# Patient Record
Sex: Male | Born: 1950
Health system: Southern US, Community
[De-identification: ages and names within clinical notes are randomized; demographics above are authoritative.]

## PROBLEM LIST (undated history)

## (undated) DIAGNOSIS — L570 Actinic keratosis: Secondary | ICD-10-CM

## (undated) DIAGNOSIS — C801 Malignant (primary) neoplasm, unspecified: Secondary | ICD-10-CM

## (undated) DIAGNOSIS — C61 Malignant neoplasm of prostate: Secondary | ICD-10-CM

## (undated) DIAGNOSIS — H4010X0 Unspecified open-angle glaucoma, stage unspecified: Secondary | ICD-10-CM

## (undated) DIAGNOSIS — I10 Essential (primary) hypertension: Secondary | ICD-10-CM

## (undated) DIAGNOSIS — K219 Gastro-esophageal reflux disease without esophagitis: Secondary | ICD-10-CM

## (undated) HISTORY — DX: Malignant neoplasm of prostate: C61

## (undated) HISTORY — PX: ANTERIOR CRUCIATE LIGAMENT REPAIR: SHX115

## (undated) HISTORY — DX: Actinic keratosis: L57.0

---

## 2006-09-09 ENCOUNTER — Ambulatory Visit: Payer: Self-pay | Admitting: Unknown Physician Specialty

## 2009-02-27 DIAGNOSIS — C4492 Squamous cell carcinoma of skin, unspecified: Secondary | ICD-10-CM | POA: Insufficient documentation

## 2009-02-27 HISTORY — DX: Squamous cell carcinoma of skin, unspecified: C44.92

## 2012-01-31 ENCOUNTER — Ambulatory Visit: Payer: Self-pay | Admitting: Urology

## 2012-03-08 ENCOUNTER — Other Ambulatory Visit: Payer: Self-pay | Admitting: Urology

## 2012-04-03 ENCOUNTER — Ambulatory Visit: Payer: Self-pay | Admitting: Sports Medicine

## 2012-06-09 ENCOUNTER — Other Ambulatory Visit: Payer: Self-pay | Admitting: Urology

## 2012-06-09 NOTE — Progress Notes (Signed)
Need surgery orders thanks

## 2012-06-12 ENCOUNTER — Other Ambulatory Visit (HOSPITAL_COMMUNITY): Payer: Self-pay | Admitting: Urology

## 2012-06-12 NOTE — Patient Instructions (Addendum)
18 Dennis Steele  06/12/2012   Your procedure is scheduled on:  08-20-2011  Report to [facility] at  0830 AM.  Call this number if you have problems the morning of surgery: [phone]   Remember:   Do not eat food :After Midnight  Sunday night  Clear liquids all day Sunday 06-18-2012. Follow all bowel prep instructions by dr Laverle Patter.   Take these medicines the morning of surgery with A SIP OF WATER:  No meds to take   Do not wear jewelry or make up.  Do not wear lotions, powders, or perfumes. You may wear deodorant.    Do not bring valuables to the hospital.  Contacts, dentures or bridgework may not be worn into surgery.  Leave suitcase in the car. After surgery it may be brought to your room.  For patients admitted to the hospital, checkout time is 11:00 AM the day of discharge                             Patients discharged the day of surgery will not be allowed to drive home. If going home same day of surgery, you must have someone stay with you the first 24 hours at home and arrange for some one to drive you home from hospital.    Special Instructions: See [facility] Preparing for Surgery instruction sheet. Women do not shave legs or underarms for 12 hours before showers. Men may shave face morning of surgery.    Please read over the following fact sheets that you were given: MRSA Information, blood fact sheet, incentive spirometer fact sheet  [name] pre op nurse phone number [phone], call if needed

## 2012-06-13 ENCOUNTER — Encounter (HOSPITAL_COMMUNITY): Payer: Self-pay

## 2012-06-13 ENCOUNTER — Other Ambulatory Visit: Payer: Self-pay

## 2012-06-13 ENCOUNTER — Encounter (HOSPITAL_COMMUNITY): Payer: Self-pay | Admitting: Pharmacy Technician

## 2012-06-13 ENCOUNTER — Encounter (HOSPITAL_COMMUNITY)
Admission: RE | Admit: 2012-06-13 | Discharge: 2012-06-13 | Disposition: A | Discharge status: Home / Self Care | Payer: BC Managed Care – PPO | Source: Ambulatory Visit | Attending: Urology | Admitting: Urology

## 2012-06-13 ENCOUNTER — Ambulatory Visit (HOSPITAL_COMMUNITY)
Admission: RE | Admit: 2012-06-13 | Discharge: 2012-06-13 | Disposition: A | Discharge status: Home / Self Care | Payer: BC Managed Care – PPO | Source: Ambulatory Visit | Attending: Urology | Admitting: Urology

## 2012-06-13 DIAGNOSIS — C61 Malignant neoplasm of prostate: Secondary | ICD-10-CM | POA: Insufficient documentation

## 2012-06-13 DIAGNOSIS — Z01812 Encounter for preprocedural laboratory examination: Secondary | ICD-10-CM | POA: Insufficient documentation

## 2012-06-13 HISTORY — DX: Malignant (primary) neoplasm, unspecified: C80.1

## 2012-06-13 HISTORY — DX: Essential (primary) hypertension: I10

## 2012-06-13 HISTORY — DX: Gastro-esophageal reflux disease without esophagitis: K21.9

## 2012-06-13 LAB — BASIC METABOLIC PANEL
GFR calc Af Amer: >90 mL/min (ref >90)
GFR calc non Af Amer: 89 mL/min — ABNORMAL LOW (ref >90)
Potassium: 4.3 mEq/L (ref 3.5–5.1)
Sodium: 139 mEq/L (ref 135–145)

## 2012-06-13 LAB — SURGICAL PCR SCREEN
MRSA, PCR: NEGATIVE
Staphylococcus aureus: NEGATIVE

## 2012-06-13 LAB — CBC
Hemoglobin: 14.1 g/dL (ref 13.0–17.0)
RBC: 4.4 MIL/uL (ref 4.22–5.81)
WBC: 5 10*3/uL (ref 4.0–10.5)

## 2012-06-16 NOTE — H&P (Signed)
History of Present Illness  [name] is a 61 year old gentleman who was foung to have a PSA of 4.0 and free % of 19% prompting a prostate biopsy by Dr. Achilles Dunk on 12/10/11.  This demonstrated Gleason 3+4=7 adenocarcinoma of the prostate with 4 out of 12 biopsy cores positive for malignancy.  He underwent a staging MRI on 01/31/12 which revealed foci consistent with bilateral organ confined disease but no evidence of EPE, SVI, or lymphadenopathy. He has a maternal family history of breast cancer but no family history of prostate cancer.  TNM stage: cT1c N0 Mx PSA: 4.0 Gleason score: 3+4=7 Biopsy (12/10/11 - read by Dr. Denton Brick with [employer], Acc# [id number]): 4/12 cores -- R apex (10%, 3+3=6), R lateral apex (43%, 3+3=6), 25%, 3+3=6), R lateral base (50%, 3+4=7) Prostate volume: 31.6  cc  Nomogram: OC disease: 82% EPE: 13% SVI: 1% LNI: 2.2% PFS (surgery): 95%, 92%  Urinary function: IPSS is 8.  Erectile function: He denies erectile dysfunction. SHIM score is 25.   Interval history:  He has elected to proceed with surgical treatment of his prostate cancer and follows up today for preoperative evaluation. Overall, he has remained in stable health since I last saw him in July. He does continue to struggle with some left-sided hip pain and did undergo an MRI of the pelvis since his last visit which fortunately did not demonstrate any evidence for metastatic disease.     Past Medical History Problems  1. History of  Esophageal Reflux 530.81 2. History of  Nephrolithiasis V13.01 3. History of  Prostate Cancer V10.46 4. History of  Rosacea 695.3  Surgical History Problems  1. History of  Primary Repair Of Knee Ligament Cruciate Anterior  Current Meds 1. Lisinopril 20 MG Oral Tablet; Therapy: 12Jul2013 to 2. Meloxicam 7.5 MG Oral Tablet; Therapy: (Recorded:30Jul2013) to 3. Travatan Z 0.004 % Ophthalmic Solution; Therapy: (Recorded:30Jul2013) to  Allergies Medication  1.  No Known Drug Allergies  Family History Problems  1. Family history of  Bladder Cancer V16.52 2. Maternal history of  Breast Cancer V16.3 3. Maternal history of  Diabetes Mellitus V18.0 4. Family history of  Esophageal Cancer V16.0 5. Family history of  Hypertension V17.49  Social History Problems  1. Alcohol Use drinks alcohol socially, not an every day drinker 2. Cigars (___ A Day) 305.1 occassionally 3. Former Smoker V15.82 off and on for 2 years  Review of Systems Constitutional, skin, eye, otolaryngeal, hematologic/lymphatic, cardiovascular, pulmonary, endocrine, musculoskeletal, gastrointestinal, neurological and psychiatric system(s) were reviewed and pertinent findings if present are noted.  Musculoskeletal: joint pain.    Vitals Vital Signs [Data Includes: Last 1 Day]  19Nov2013 12:24PM  Blood Pressure: 143 / 77 Heart Rate: 78  Physical Exam Constitutional: Well nourished and well developed . No acute distress.  ENT:. The ears and nose are normal in appearance.  Neck: The appearance of the neck is normal and no neck mass is present.  Pulmonary: No respiratory distress, normal respiratory rhythm and effort and clear bilateral breath sounds.  Cardiovascular: Heart rate and rhythm are normal . No peripheral edema.  Abdomen: The abdomen is soft and nontender. No masses are palpated. No CVA tenderness. No hernias are palpable. No hepatosplenomegaly noted.  Rectal: Rectal exam demonstrates normal sphincter tone, no tenderness and no masses. Prostate size is estimated to be 30 g. The prostate has no nodularity and is not tender. The left seminal vesicle is nonpalpable. The right seminal vesicle is nonpalpable. The perineum  is normal on inspection.  Lymphatics: The femoral and inguinal nodes are not enlarged or tender.  Skin: Normal skin turgor, no visible rash and no visible skin lesions.  Neuro/Psych:. Mood and affect are appropriate.    Results/Data Urine [Data Includes:  Last 1 Day]   19Nov2013  COLOR YELLOW   APPEARANCE CLEAR   SPECIFIC GRAVITY 1.020   pH 6.0   GLUCOSE NEG mg/dL  BILIRUBIN MOD   KETONE NEG mg/dL  BLOOD TRACE   PROTEIN NEG mg/dL  UROBILINOGEN 0.2 mg/dL  NITRITE NEG   LEUKOCYTE ESTERASE NEG   SQUAMOUS EPITHELIAL/HPF RARE   WBC NONE SEEN WBC/hpf  RBC 0-2 RBC/hpf  BACTERIA NONE SEEN   CRYSTALS NONE SEEN   CASTS NONE SEEN    Assessment  Prostate Cancer 185    Plan  Health Maintenance (V70.0)  1. UA With REFLEX  Done: 19Nov2013 10:35AM Prostate Cancer (185)  2. Follow-up Office  Follow-up  Requested for: 19Nov2013  Discussion/Summary 1. Prostate cancer: He is ready to proceed with surgical treatment of his prostate cancer and is scheduled to undergo a bilateral nerve sparing robotic prostatectomy and pelvic lymphadenectomy next week. We have reviewed the potential risks, complications, alternative options, and expected recovery process. We have also discussed the potential risks and benefits with regard to cancer control, urinary function, and erectile function. He feels well prepared to proceed and gives his informed consent today.  He will stop his anti-inflammatory medication at least 48 hours prior to his procedure. He also mentioned that he does tend to get yeast infections when he takes antibiotics and had questions about taking an antifungal around the time of his postoperative antibiotic. We discussed having him take fluconazole when he returns for his postoperative visit.  Cc: Dr. Lorie Phenix Dr. Assunta Gambles     Signatures Electronically signed by : Heloise Purpura, M.D.; Jun 13 2012  1:57PM

## 2012-06-18 MED ORDER — CEFAZOLIN SODIUM-DEXTROSE 2-3 GM-% IV SOLR
2.0000 g | INTRAVENOUS | Status: AC
  Administered 2012-06-19: 2 g via INTRAVENOUS

## 2012-06-19 ENCOUNTER — Encounter (HOSPITAL_COMMUNITY)
Admission: RE | Disposition: A | Discharge status: Home / Self Care | Payer: Self-pay | Source: Ambulatory Visit | Attending: Urology

## 2012-06-19 ENCOUNTER — Observation Stay (HOSPITAL_COMMUNITY)
Admission: RE | Admit: 2012-06-19 | Discharge: 2012-06-20 | Disposition: A | Discharge status: Home / Self Care | Payer: BC Managed Care – PPO | Source: Ambulatory Visit | Attending: Urology | Admitting: Urology

## 2012-06-19 ENCOUNTER — Inpatient Hospital Stay (HOSPITAL_COMMUNITY): Payer: BC Managed Care – PPO | Admitting: Anesthesiology

## 2012-06-19 ENCOUNTER — Encounter (HOSPITAL_COMMUNITY): Payer: Self-pay | Admitting: *Deleted

## 2012-06-19 ENCOUNTER — Encounter (HOSPITAL_COMMUNITY): Payer: Self-pay | Admitting: Anesthesiology

## 2012-06-19 DIAGNOSIS — Z79899 Other long term (current) drug therapy: Secondary | ICD-10-CM | POA: Insufficient documentation

## 2012-06-19 DIAGNOSIS — C61 Malignant neoplasm of prostate: Principal | ICD-10-CM | POA: Insufficient documentation

## 2012-06-19 DIAGNOSIS — I10 Essential (primary) hypertension: Secondary | ICD-10-CM | POA: Insufficient documentation

## 2012-06-19 DIAGNOSIS — R112 Nausea with vomiting, unspecified: Secondary | ICD-10-CM | POA: Insufficient documentation

## 2012-06-19 DIAGNOSIS — K219 Gastro-esophageal reflux disease without esophagitis: Secondary | ICD-10-CM | POA: Insufficient documentation

## 2012-06-19 HISTORY — PX: ROBOT ASSISTED LAPAROSCOPIC RADICAL PROSTATECTOMY: SHX5141

## 2012-06-19 HISTORY — PX: LYMPHADENECTOMY: SHX5960

## 2012-06-19 LAB — TYPE AND SCREEN: Antibody Screen: NEGATIVE

## 2012-06-19 LAB — HEMOGLOBIN AND HEMATOCRIT, BLOOD: HCT: 39.5 % (ref 39.0–52.0)

## 2012-06-19 SURGERY — ROBOTIC ASSISTED LAPAROSCOPIC RADICAL PROSTATECTOMY LEVEL 2
Anesthesia: General | Site: Abdomen | Wound class: Clean Contaminated

## 2012-06-19 MED ORDER — HYDROCODONE-ACETAMINOPHEN 5-325 MG PO TABS: 1.0000 | ORAL_TABLET | Freq: Four times a day (QID) | ORAL | Status: DC | PRN

## 2012-06-19 MED ORDER — TRAVOPROST (BAK FREE) 0.004 % OP SOLN
1.0000 [drp] | Freq: Every day | OPHTHALMIC | Status: DC
  Filled 2012-06-19: qty 2.5

## 2012-06-19 MED ORDER — PROPOFOL 10 MG/ML IV BOLUS
INTRAVENOUS | Status: DC | PRN
  Administered 2012-06-19: 200 mg via INTRAVENOUS
  Administered 2012-06-19 (×2): 40 mg via INTRAVENOUS

## 2012-06-19 MED ORDER — HYDROMORPHONE HCL PF 1 MG/ML IJ SOLN: 0.2500 mg | INTRAMUSCULAR | Status: DC | PRN

## 2012-06-19 MED ORDER — DEXAMETHASONE SODIUM PHOSPHATE 10 MG/ML IJ SOLN
INTRAMUSCULAR | Status: DC | PRN
  Administered 2012-06-19: 10 mg via INTRAVENOUS

## 2012-06-19 MED ORDER — ACETAMINOPHEN 10 MG/ML IV SOLN
INTRAVENOUS | Status: DC | PRN
  Administered 2012-06-19: 1000 mg via INTRAVENOUS

## 2012-06-19 MED ORDER — SODIUM CHLORIDE 0.9 % IV BOLUS (SEPSIS)
1000.0000 mL | Freq: Once | INTRAVENOUS | Status: AC
  Administered 2012-06-19: 1000 mL via INTRAVENOUS

## 2012-06-19 MED ORDER — ACETAMINOPHEN 325 MG PO TABS: 650.0000 mg | ORAL_TABLET | ORAL | Status: DC | PRN

## 2012-06-19 MED ORDER — HYDROMORPHONE HCL PF 1 MG/ML IJ SOLN
INTRAMUSCULAR | Status: DC | PRN
  Administered 2012-06-19: 1 mg via INTRAVENOUS

## 2012-06-19 MED ORDER — ROCURONIUM BROMIDE 100 MG/10ML IV SOLN
INTRAVENOUS | Status: DC | PRN
  Administered 2012-06-19: 50 mg via INTRAVENOUS
  Administered 2012-06-19: 20 mg via INTRAVENOUS

## 2012-06-19 MED ORDER — SODIUM CHLORIDE 0.9 % IJ SOLN
INTRAMUSCULAR | Status: DC | PRN
  Administered 2012-06-19: 10 mL via INTRAVENOUS

## 2012-06-19 MED ORDER — SUCCINYLCHOLINE CHLORIDE 20 MG/ML IJ SOLN
INTRAMUSCULAR | Status: DC | PRN
  Administered 2012-06-19: 100 mg via INTRAVENOUS

## 2012-06-19 MED ORDER — CEFAZOLIN SODIUM 1-5 GM-% IV SOLN
1.0000 g | Freq: Three times a day (TID) | INTRAVENOUS | Status: AC
  Administered 2012-06-19 – 2012-06-20 (×2): 1 g via INTRAVENOUS
  Filled 2012-06-19 (×3): qty 50

## 2012-06-19 MED ORDER — SUFENTANIL CITRATE 50 MCG/ML IV SOLN
INTRAVENOUS | Status: DC | PRN
  Administered 2012-06-19: 10 ug via INTRAVENOUS
  Administered 2012-06-19: 5 ug via INTRAVENOUS
  Administered 2012-06-19: 10 ug via INTRAVENOUS
  Administered 2012-06-19: 5 ug via INTRAVENOUS
  Administered 2012-06-19: 10 ug via INTRAVENOUS
  Administered 2012-06-19 (×2): 15 ug via INTRAVENOUS
  Administered 2012-06-19: 10 ug via INTRAVENOUS

## 2012-06-19 MED ORDER — ONDANSETRON HCL 4 MG/2ML IJ SOLN
INTRAMUSCULAR | Status: DC | PRN
  Administered 2012-06-19: 4 mg via INTRAVENOUS

## 2012-06-19 MED ORDER — MIDAZOLAM HCL 5 MG/5ML IJ SOLN
INTRAMUSCULAR | Status: DC | PRN
  Administered 2012-06-19 (×2): 2 mg via INTRAVENOUS

## 2012-06-19 MED ORDER — KETOROLAC TROMETHAMINE 15 MG/ML IJ SOLN
15.0000 mg | Freq: Four times a day (QID) | INTRAMUSCULAR | Status: DC
  Administered 2012-06-19 – 2012-06-20 (×5): 15 mg via INTRAVENOUS
  Filled 2012-06-19 (×5): qty 1

## 2012-06-19 MED ORDER — NEOSTIGMINE METHYLSULFATE 1 MG/ML IJ SOLN
INTRAMUSCULAR | Status: DC | PRN
  Administered 2012-06-19: 3 mg via INTRAVENOUS

## 2012-06-19 MED ORDER — DIPHENHYDRAMINE HCL 50 MG/ML IJ SOLN: 12.5000 mg | Freq: Four times a day (QID) | INTRAMUSCULAR | Status: DC | PRN

## 2012-06-19 MED ORDER — LIDOCAINE HCL (CARDIAC) 20 MG/ML IV SOLN
INTRAVENOUS | Status: DC | PRN
  Administered 2012-06-19: 30 mg via INTRAVENOUS

## 2012-06-19 MED ORDER — LACTATED RINGERS IV SOLN
INTRAVENOUS | Status: DC | PRN
  Administered 2012-06-19: 11:00:00

## 2012-06-19 MED ORDER — BUPIVACAINE-EPINEPHRINE 0.25% -1:200000 IJ SOLN
INTRAMUSCULAR | Status: DC | PRN
  Administered 2012-06-19: 28 mL

## 2012-06-19 MED ORDER — LACTATED RINGERS IV SOLN
INTRAVENOUS | Status: DC
  Administered 2012-06-19: 1000 mL via INTRAVENOUS
  Administered 2012-06-19 (×2): via INTRAVENOUS

## 2012-06-19 MED ORDER — DOCUSATE SODIUM 100 MG PO CAPS
100.0000 mg | ORAL_CAPSULE | Freq: Two times a day (BID) | ORAL | Status: DC
  Administered 2012-06-19 – 2012-06-20 (×2): 100 mg via ORAL
  Filled 2012-06-19 (×3): qty 1

## 2012-06-19 MED ORDER — GLYCOPYRROLATE 0.2 MG/ML IJ SOLN
INTRAMUSCULAR | Status: DC | PRN
  Administered 2012-06-19: 0.4 mg via INTRAVENOUS

## 2012-06-19 MED ORDER — SODIUM CHLORIDE 0.9 % IR SOLN
Status: DC | PRN
  Administered 2012-06-19: 1000 mL via INTRAVESICAL

## 2012-06-19 MED ORDER — MORPHINE SULFATE 2 MG/ML IJ SOLN: 2.0000 mg | INTRAMUSCULAR | Status: DC | PRN

## 2012-06-19 MED ORDER — INDIGOTINDISULFONATE SODIUM 8 MG/ML IJ SOLN
INTRAMUSCULAR | Status: DC | PRN
  Administered 2012-06-19 (×2): 40 mg via INTRAVENOUS

## 2012-06-19 MED ORDER — DIPHENHYDRAMINE HCL 12.5 MG/5ML PO ELIX: 12.5000 mg | ORAL_SOLUTION | Freq: Four times a day (QID) | ORAL | Status: DC | PRN

## 2012-06-19 MED ORDER — LIDOCAINE HCL 4 % MT SOLN
OROMUCOSAL | Status: DC | PRN
  Administered 2012-06-19: 4 mL via TOPICAL

## 2012-06-19 MED ORDER — KCL IN DEXTROSE-NACL 20-5-0.45 MEQ/L-%-% IV SOLN
INTRAVENOUS | Status: DC
  Administered 2012-06-19: 23:00:00 via INTRAVENOUS
  Administered 2012-06-19: 150 mL/h via INTRAVENOUS
  Administered 2012-06-20: 06:00:00 via INTRAVENOUS
  Filled 2012-06-19 (×4): qty 1000

## 2012-06-19 MED ORDER — PROMETHAZINE HCL 25 MG/ML IJ SOLN: 6.2500 mg | INTRAMUSCULAR | Status: DC | PRN

## 2012-06-19 MED ORDER — CIPROFLOXACIN HCL 500 MG PO TABS: 500.0000 mg | ORAL_TABLET | Freq: Two times a day (BID) | ORAL | Status: DC

## 2012-06-19 MED ORDER — STERILE WATER FOR IRRIGATION IR SOLN
Status: DC | PRN
  Administered 2012-06-19: 3000 mL

## 2012-06-19 MED ORDER — LABETALOL HCL 5 MG/ML IV SOLN
INTRAVENOUS | Status: DC | PRN
  Administered 2012-06-19 (×2): 5 mg via INTRAVENOUS

## 2012-06-19 SURGICAL SUPPLY — 38 items
CANISTER SUCTION 2500CC (MISCELLANEOUS) ×3 IMPLANT
CATH ROBINSON RED A/P 8FR (CATHETERS) ×3 IMPLANT
CHLORAPREP W/TINT 26ML (MISCELLANEOUS) ×3 IMPLANT
CLIP LIGATING HEM O LOK PURPLE (MISCELLANEOUS) ×9 IMPLANT
CLOTH BEACON ORANGE TIMEOUT ST (SAFETY) ×3 IMPLANT
CORD HIGH FREQUENCY UNIPOLAR (ELECTROSURGICAL) ×3 IMPLANT
COVER SURGICAL LIGHT HANDLE (MISCELLANEOUS) ×3 IMPLANT
COVER TIP SHEARS 8 DVNC (MISCELLANEOUS) ×2 IMPLANT
COVER TIP SHEARS 8MM DA VINCI (MISCELLANEOUS) ×1
CUTTER ECHEON FLEX ENDO 45 340 (ENDOMECHANICALS) ×3 IMPLANT
DECANTER SPIKE VIAL GLASS SM (MISCELLANEOUS) IMPLANT
DRAPE SURG IRRIG POUCH 19X23 (DRAPES) ×3 IMPLANT
DRSG TEGADERM 2-3/8X2-3/4 SM (GAUZE/BANDAGES/DRESSINGS) ×15 IMPLANT
DRSG TEGADERM 4X4.75 (GAUZE/BANDAGES/DRESSINGS) ×3 IMPLANT
DRSG TEGADERM 6X8 (GAUZE/BANDAGES/DRESSINGS) ×6 IMPLANT
ELECT REM PT RETURN 9FT ADLT (ELECTROSURGICAL) ×3
ELECTRODE REM PT RTRN 9FT ADLT (ELECTROSURGICAL) ×2 IMPLANT
GLOVE BIO SURGEON STRL SZ 6.5 (GLOVE) ×3 IMPLANT
GLOVE BIOGEL M STRL SZ7.5 (GLOVE) ×6 IMPLANT
GOWN STRL NON-REIN LRG LVL3 (GOWN DISPOSABLE) ×9 IMPLANT
GOWN STRL REIN XL XLG (GOWN DISPOSABLE) ×6 IMPLANT
HOLDER FOLEY CATH W/STRAP (MISCELLANEOUS) ×3 IMPLANT
IV LACTATED RINGERS 1000ML (IV SOLUTION) IMPLANT
KIT ACCESSORY DA VINCI DISP (KITS) ×1
KIT ACCESSORY DVNC DISP (KITS) ×2 IMPLANT
NDL SAFETY ECLIPSE 18X1.5 (NEEDLE) ×2 IMPLANT
NEEDLE HYPO 18GX1.5 SHARP (NEEDLE) ×1
PACK ROBOT UROLOGY CUSTOM (CUSTOM PROCEDURE TRAY) ×3 IMPLANT
RELOAD GREEN ECHELON 45 (STAPLE) ×3 IMPLANT
SET TUBE IRRIG SUCTION NO TIP (IRRIGATION / IRRIGATOR) ×3 IMPLANT
SOLUTION ELECTROLUBE (MISCELLANEOUS) ×3 IMPLANT
SPONGE GAUZE 4X4 12PLY (GAUZE/BANDAGES/DRESSINGS) ×3 IMPLANT
SUT ETHILON 3 0 PS 1 (SUTURE) ×3 IMPLANT
SUT VICRYL 0 UR6 27IN ABS (SUTURE) ×6 IMPLANT
SYR 27GX1/2 1ML LL SAFETY (SYRINGE) ×3 IMPLANT
TOWEL OR 17X26 10 PK STRL BLUE (TOWEL DISPOSABLE) ×3 IMPLANT
TOWEL OR NON WOVEN STRL DISP B (DISPOSABLE) ×3 IMPLANT
WATER STERILE IRR 1500ML POUR (IV SOLUTION) IMPLANT

## 2012-06-19 NOTE — Progress Notes (Signed)
Patient ID: Dennis Steele, male   DOB: February 19, 1951, 61 y.o.   MRN: [id number]  Post-op note  Subjective: The patient is doing well.  No complaints.  Objective: Vital signs in last 24 hours: Temp:  [97.7 F (36.5 C)-98.5 F (36.9 C)] 97.7 F (36.5 C) (11/25 1435) Pulse Rate:  [71-82] 74  (11/25 1435) Resp:  [13-18] 16  (11/25 1435) BP: (91-157)/(75-94) 144/94 mmHg (11/25 1435) SpO2:  [99 %-100 %] 99 % (11/25 1435) Weight:  [89.8 kg (197 lb 15.6 oz)] 89.8 kg (197 lb 15.6 oz) (11/25 1500)  Intake/Output from previous day:   Intake/Output this shift: Total I/O In: 2000 [I.V.:2000] Out: 675 [Urine:475; Drains:75; Blood:125]  Physical Exam:  General: Alert and oriented. Abdomen: Soft, Nondistended. Incisions: Clean and dry.  Lab Results:  Basename 06/19/12 1431  HGB 13.6  HCT 39.5    Assessment/Plan: POD#0   1) Continue to monitor   Moody Bruins. MD   LOS: 0 days   Shrita Thien,LES 06/19/2012, 5:13 PM

## 2012-06-19 NOTE — Anesthesia Preprocedure Evaluation (Addendum)
Anesthesia Evaluation  Patient identified by MRN, date of birth, ID band Patient awake    Reviewed: Allergy & Precautions, H&P , NPO status , Patient's Chart, lab work & pertinent test results  Airway Mallampati: III TM Distance: <3 FB Neck ROM: Full  Mouth opening: Limited Mouth Opening  Dental No notable dental hx.    Pulmonary Current Smoker,  breath sounds clear to auscultation  Pulmonary exam normal       Cardiovascular hypertension, Pt. on medications Rhythm:Regular Rate:Normal     Neuro/Psych negative neurological ROS  negative psych ROS   GI/Hepatic negative GI ROS, Neg liver ROS,   Endo/Other  negative endocrine ROS  Renal/GU negative Renal ROS  negative genitourinary   Musculoskeletal negative musculoskeletal ROS (+)   Abdominal   Peds negative pediatric ROS (+)  Hematology negative hematology ROS (+)   Anesthesia Other Findings   Reproductive/Obstetrics negative OB ROS                          Anesthesia Physical Anesthesia Plan  ASA: II  Anesthesia Plan: General   Post-op Pain Management:    Induction: Intravenous  Airway Management Planned: Oral ETT  Additional Equipment:   Intra-op Plan:   Post-operative Plan: Extubation in OR  Informed Consent: I have reviewed the patients History and Physical, chart, labs and discussed the procedure including the risks, benefits and alternatives for the proposed anesthesia with the patient or authorized representative who has indicated his/her understanding and acceptance.   Dental advisory given  Plan Discussed with: CRNA and Surgeon  Anesthesia Plan Comments:         Anesthesia Quick Evaluation

## 2012-06-19 NOTE — Care Management Note (Unsigned)
    Page 1 of 1   06/19/2012     4:30:03 PM   CARE MANAGEMENT NOTE 06/19/2012  Patient:  Dennis Steele,Dennis Steele   Account Number:  [id number]  Date Initiated:  06/19/2012  Documentation initiated by:  Lanier Clam  Subjective/Objective Assessment:   ADMITTED W/ADENO OF PROSTATE.     Action/Plan:   FROM HOME   Anticipated DC Date:  06/20/2012   Anticipated DC Plan:  HOME/SELF CARE      DC Planning Services  CM consult      Choice offered to / List presented to:             Status of service:  In process, will continue to follow Medicare Important Message given?   (If response is "NO", the following Medicare IM given date fields will be blank) Date Medicare IM given:   Date Additional Medicare IM given:    Discharge Disposition:    Per UR Regulation:  Reviewed for med. necessity/level of care/duration of stay  If discussed at Long Length of Stay Meetings, dates discussed:    Comments:  06/19/12 Geofrey Silliman RN,BSN NCM [phone] S/P ROBOTIC RADICAL PROSTATECTOMY.

## 2012-06-19 NOTE — Anesthesia Procedure Notes (Addendum)
Performed by: Anastasio Champion E   Procedure Name: Intubation Date/Time: 06/19/2012 10:50 AM Performed by: Illene Silver Pre-anesthesia Checklist: Patient identified, Emergency Drugs available, Suction available and Patient being monitored Patient Re-evaluated:Patient Re-evaluated prior to inductionOxygen Delivery Method: Circle system utilized Preoxygenation: Pre-oxygenation with 100% oxygen Intubation Type: IV induction and Cricoid Pressure applied Ventilation: Mask ventilation without difficulty and Oral airway inserted - appropriate to patient size Laryngoscope Size: Mac and 4 Grade View: Grade II Tube type: Oral (BBS= +ETCO2,laryns red and irritated prior to ETT, anterior larynx) Tube size: 7.5 mm Number of attempts: 2 (CRNA unable to lift epiglottis, Dr. Okey Dupre placed ETT after bougie) Airway Equipment and Method: Stylet Placement Confirmation: ETT inserted through vocal cords under direct vision,  positive ETCO2 and breath sounds checked- equal and bilateral Secured at: 21 cm Dental Injury: Teeth and Oropharynx as per pre-operative assessment  Future Recommendations: Recommend- induction with short-acting agent, and alternative techniques readily available Comments: Bougie, anterior larynx, small mouth,

## 2012-06-19 NOTE — Interval H&P Note (Signed)
History and Physical Interval Note:  06/19/2012 9:44 AM  Dennis Steele  has presented today for surgery, with the diagnosis of PROSTATE CANCER  The various methods of treatment have been discussed with the patient and family. After consideration of risks, benefits and other options for treatment, the patient has consented to  Procedure(s) (LRB) with comments: ROBOTIC ASSISTED LAPAROSCOPIC RADICAL PROSTATECTOMY LEVEL 2 (N/A) LYMPHADENECTOMY (Bilateral) as a surgical intervention .  The patient's history has been reviewed, patient examined, no change in status, stable for surgery.  I have reviewed the patient's chart and labs.  Questions were answered to the patient's satisfaction.     Korinne Greenstein,LES

## 2012-06-19 NOTE — Anesthesia Postprocedure Evaluation (Signed)
  Anesthesia Post-op Note  Patient: Dennis Steele  Procedure(s) Performed: Procedure(s) (LRB): ROBOTIC ASSISTED LAPAROSCOPIC RADICAL PROSTATECTOMY LEVEL 2 (N/A) LYMPHADENECTOMY (Bilateral)  Patient Location: PACU  Anesthesia Type: General  Level of Consciousness: awake and alert   Airway and Oxygen Therapy: Patient Spontanous Breathing  Post-op Pain: mild  Post-op Assessment: Post-op Vital signs reviewed, Patient's Cardiovascular Status Stable, Respiratory Function Stable, Patent Airway and No signs of Nausea or vomiting  Last Vitals:  Filed Vitals:   06/19/12 1415  BP: 149/85  Pulse: 78  Temp:   Resp: 16    Post-op Vital Signs: stable   Complications: No apparent anesthesia complications

## 2012-06-19 NOTE — Transfer of Care (Signed)
Immediate Anesthesia Transfer of Care Note  Patient: Dennis Steele  Procedure(s) Performed: Procedure(s) (LRB) with comments: ROBOTIC ASSISTED LAPAROSCOPIC RADICAL PROSTATECTOMY LEVEL 2 (N/A) LYMPHADENECTOMY (Bilateral)  Patient Location: PACU  Anesthesia Type:General  Level of Consciousness: awake, alert , oriented and patient cooperative  Airway & Oxygen Therapy: Patient Spontanous Breathing and Patient connected to face mask oxygen  Post-op Assessment: Report given to PACU RN, Post -op Vital signs reviewed and stable and Patient moving all extremities X 4  Post vital signs: stable  Complications: No apparent anesthesia complications

## 2012-06-19 NOTE — Op Note (Signed)
Preoperative diagnosis: Clinically localized adenocarcinoma of the prostate (clinical stage cT1c N0 Mx)  Postoperative diagnosis: Clinically localized adenocarcinoma of the prostate (clinical stage cT1c N0 Mx)  Procedure:  1. Robotic assisted laparoscopic radical prostatectomy (bilateral nerve sparing) 2. Bilateral robotic assisted laparoscopic pelvic lymphadenectomy  Surgeon: Moody Bruins. M.D.  Assistant: Pecola Leisure PA-C  Anesthesia: General  Complications: None  EBL: 125 mL  IVF:  2000 mL crystalloid  Specimens: 1. Prostate and seminal vesicles 2. Right pelvic lymph nodes 3. Left pelvic lymph nodes  Disposition of specimens: Pathology  Drains: 1. 20 Fr coude catheter 2. # 19 Blake pelvic drain  Indication: Dennis Steele is a 61 y.o. year old patient with clinically localized prostate cancer.  After a thorough review of the management options for treatment of prostate cancer, he elected to proceed with surgical therapy and the above procedure(s).  We have discussed the potential benefits and risks of the procedure, side effects of the proposed treatment, the likelihood of the patient achieving the goals of the procedure, and any potential problems that might occur during the procedure or recuperation. Informed consent has been obtained.  Description of procedure:  The patient was taken to the operating room and a general anesthetic was administered. He was given preoperative antibiotics, placed in the dorsal lithotomy position, and prepped and draped in the usual sterile fashion. Next a preoperative timeout was performed. A urethral catheter was placed into the bladder and a site was selected near the umbilicus for placement of the camera port. This was placed using a standard open Hassan technique which allowed entry into the peritoneal cavity under direct vision and without difficulty. A 12 mm port was placed and a pneumoperitoneum established. The camera was then  used to inspect the abdomen and there was no evidence of any intra-abdominal injuries or other abnormalities. The remaining abdominal ports were then placed. 8 mm robotic ports were placed in the right lower quadrant, left lower quadrant, and far left lateral abdominal wall. A 5 mm port was placed in the right upper quadrant and a 12 mm port was placed in the right lateral abdominal wall for laparoscopic assistance. All ports were placed under direct vision without difficulty. The surgical cart was then docked.   Utilizing the cautery scissors, the bladder was reflected posteriorly allowing entry into the space of Retzius and identification of the endopelvic fascia and prostate. The periprostatic fat was then removed from the prostate allowing full exposure of the endopelvic fascia. The endopelvic fascia was then incised from the apex back to the base of the prostate bilaterally and the underlying levator muscle fibers were swept laterally off the prostate thereby isolating the dorsal venous complex. The dorsal vein was then stapled and divided with a 45 mm Flex Echelon stapler. Attention then turned to the bladder neck which was divided anteriorly thereby allowing entry into the bladder and exposure of the urethral catheter. The catheter balloon was deflated and the catheter was brought into the operative field and used to retract the prostate anteriorly. The posterior bladder neck was then examined and was divided allowing further dissection between the bladder and prostate posteriorly until the vasa deferentia and seminal vessels were identified. The vasa deferentia were isolated, divided, and lifted anteriorly. The seminal vesicles were dissected down to their tips with care to control the seminal vascular arterial blood supply. These structures were then lifted anteriorly and the space between Denonvillier's fascia and the anterior rectum was developed with a combination of  sharp and blunt dissection. This  isolated the vascular pedicles of the prostate.  The lateral prostatic fascia was then sharply incised allowing release of the neurovascular bundles bilaterally. The vascular pedicles of the prostate were then ligated with Weck clips between the prostate and neurovascular bundles and divided with sharp cold scissor dissection resulting in neurovascular bundle preservation. The neurovascular bundles were then separated off the apex of the prostate and urethra bilaterally.  The urethra was then sharply transected allowing the prostate specimen to be disarticulated. The pelvis was copiously irrigated and hemostasis was ensured. There was no evidence for rectal injury.  Attention then turned to the right pelvic sidewall. The fibrofatty tissue between the external iliac vein, confluence of the iliac vessels, hypogastric artery, and Cooper's ligament was dissected free from the pelvic sidewall with care to preserve the obturator nerve. Weck clips were used for lymphostasis and hemostasis. An identical procedure was performed on the contralateral side and the lymphatic packets were removed for permanent pathologic analysis.  Attention then turned to the urethral anastomosis. A 2-0 Vicryl slip knot was placed between Denonvillier's fascia, the posterior bladder neck, and the posterior urethra to reapproximate these structures. A double-armed 3-0 Monocryl suture was then used to perform a 360 running tension-free anastomosis between the bladder neck and urethra. A new urethral catheter was then placed into the bladder and irrigated. There were no blood clots within the bladder and the anastomosis appeared to be watertight. A #19 Blake drain was then brought through the left lateral 8 mm port site and positioned appropriately within the pelvis. It was secured to the skin with a nylon suture. The surgical cart was then undocked. The right lateral 12 mm port site was closed at the fascial level with a 0 Vicryl suture  placed laparoscopically. All remaining ports were then removed under direct vision. The prostate specimen was removed intact within the Endopouch retrieval bag via the periumbilical camera port site. This fascial opening was closed with two running 0 Vicryl sutures. 0.25% Marcaine was then injected into all port sites and all incisions were reapproximated at the skin level with staples. Sterile dressings were applied. The patient appeared to tolerate the procedure well and without complications. The patient was able to be extubated and transferred to the recovery unit in satisfactory condition.   Moody Bruins MD

## 2012-06-20 ENCOUNTER — Encounter (HOSPITAL_COMMUNITY): Payer: Self-pay | Admitting: Urology

## 2012-06-20 LAB — HEMOGLOBIN AND HEMATOCRIT, BLOOD
HCT: 35.4 % — ABNORMAL LOW (ref 39.0–52.0)
Hemoglobin: 12.3 g/dL — ABNORMAL LOW (ref 13.0–17.0)

## 2012-06-20 MED ORDER — BISACODYL 10 MG RE SUPP
10.0000 mg | Freq: Once | RECTAL | Status: AC
  Administered 2012-06-20: 10 mg via RECTAL
  Filled 2012-06-20: qty 1

## 2012-06-20 MED ORDER — HYDROCODONE-ACETAMINOPHEN 5-325 MG PO TABS: 1.0000 | ORAL_TABLET | Freq: Four times a day (QID) | ORAL | Status: DC | PRN

## 2012-06-20 MED ORDER — ZOLPIDEM TARTRATE 5 MG PO TABS: 5.0000 mg | ORAL_TABLET | Freq: Every evening | ORAL | Status: DC | PRN

## 2012-06-20 NOTE — Progress Notes (Signed)
Patient ID: Dennis Steele, male   DOB: [birth year], 61 y.o.   MRN: [id number]  1 Day Post-Op Subjective: The patient is doing well.  He did have some nausea and vomited once last night. Now without nausea and tolerating clears. Pain is adequately controlled.  Objective: Vital signs in last 24 hours: Temp:  [97.4 F (36.3 C)-98.5 F (36.9 C)] 97.6 F (36.4 C) (11/26 0620) Pulse Rate:  [71-82] 75  (11/26 0620) Resp:  [13-18] 16  (11/26 0620) BP: (91-157)/(56-94) 107/56 mmHg (11/26 0620) SpO2:  [99 %-100 %] 100 % (11/26 0620) Weight:  [89.8 kg (197 lb 15.6 oz)] 89.8 kg (197 lb 15.6 oz) (11/25 1500)  Intake/Output from previous day: 11/25 0701 - 11/26 0700 In: 4280 [P.O.:480; I.V.:3800] Out: 3403 [Urine:3025; Drains:253; Blood:125] Intake/Output this shift:    Physical Exam:  General: Alert and oriented. CV: RRR Lungs: Clear bilaterally. GI: Soft, Nondistended. Positive bowel sounds. Incisions: Dressings intact. Urine: Clear Extremities: Nontender, no erythema, no edema.  Lab Results:  Basename 06/20/12 0435 06/19/12 1431  HGB 12.3* 13.6  HCT 35.4* 39.5      Assessment/Plan: POD# 1 s/p robotic prostatectomy.  1) SL IVF 2) Ambulate, Incentive spirometry 3) Transition to oral pain medication 4) Dulcolax suppository 5) D/C pelvic drain 6) Plan for likely discharge later today   Moody Bruins. MD   LOS: 1 day   Shaarav Ripple,LES 06/20/2012, 7:36 AM

## 2012-06-20 NOTE — Progress Notes (Signed)
Patient discharge to home alert and oriented, no complaints of any pain or discomfort. Discharge instructions and follow up appointment given and done. Foley teaching done in the presence of the wife , both the patient and wife verbalized understanding. PIV removed no s/s of infiltration or swelling noted. Incision site  Dressing dry and intact.

## 2012-06-20 NOTE — Discharge Summary (Signed)
  Date of admission: 06/19/2012  Date of discharge: 06/20/2012  Admission diagnosis: Prostate Cancer  Discharge diagnosis: Prostate Cancer  History and Physical: For full details, please see admission history and physical. Briefly, Dennis Steele is a 61 y.o. gentleman with localized prostate cancer.  After discussing management/treatment options, he elected to proceed with surgical treatment.  Hospital Course: Dennis Steele was taken to the operating room on 06/19/2012 and underwent a robotic assisted laparoscopic radical prostatectomy. He tolerated this procedure well and without complications. Postoperatively, he was able to be transferred to a regular hospital room following recovery from anesthesia.  He was able to begin ambulating the night of surgery. He remained hemodynamically stable overnight.  He had excellent urine output with appropriately minimal output from his pelvic drain and his pelvic drain was removed on POD #1.  He was transitioned to oral pain medication, tolerated a clear liquid diet, and had met all discharge criteria and was able to be discharged home later on POD#1.  Laboratory values:  Basename 06/20/12 0435 06/19/12 1431  HGB 12.3* 13.6  HCT 35.4* 39.5    Disposition: Home  Discharge instruction: He was instructed to be ambulatory but to refrain from heavy lifting, strenuous activity, or driving. He was instructed on urethral catheter care.  Discharge medications:     Medication List     As of 06/20/2012 11:21 AM    START taking these medications         ciprofloxacin 500 MG tablet   Commonly known as: CIPRO   Take 1 tablet (500 mg total) by mouth 2 (two) times daily. Start day prior to office visit for foley removal      HYDROcodone-acetaminophen 5-325 MG per tablet   Commonly known as: NORCO/VICODIN   Take 1-2 tablets by mouth every 6 (six) hours as needed for pain.      zolpidem 5 MG tablet   Commonly known as: AMBIEN   Take 1 tablet (5 mg total) by  mouth at bedtime as needed for sleep.      CONTINUE taking these medications         calcium carbonate 500 MG chewable tablet   Commonly known as: TUMS - dosed in mg elemental calcium      etodolac 500 MG tablet   Commonly known as: LODINE      lisinopril 20 MG tablet   Commonly known as: PRINIVIL,ZESTRIL      Travoprost (BAK Free) 0.004 % Soln ophthalmic solution   Commonly known as: TRAVATAN      valACYclovir 1000 MG tablet   Commonly known as: VALTREX      STOP taking these medications         aspirin EC 81 MG tablet      multivitamin with minerals Tabs      traMADol 50 MG tablet   Commonly known as: ULTRAM          Where to get your medications    These are the prescriptions that you need to pick up.   You may get these medications from any pharmacy.         ciprofloxacin 500 MG tablet   HYDROcodone-acetaminophen 5-325 MG per tablet   zolpidem 5 MG tablet            Followup: He will followup in 1 week for catheter removal and to discuss his surgical pathology results.

## 2012-06-20 NOTE — Progress Notes (Signed)
Patient walked the hall this pm shift. Tolerated activity well. Belching, vomited x1 clear light pink emesis. Will monitor patient.

## 2012-07-12 ENCOUNTER — Other Ambulatory Visit: Payer: Self-pay | Admitting: Neurosurgery

## 2012-07-12 DIAGNOSIS — M549 Dorsalgia, unspecified: Secondary | ICD-10-CM

## 2012-09-04 ENCOUNTER — Ambulatory Visit: Payer: Self-pay

## 2012-11-24 DIAGNOSIS — M5137 Other intervertebral disc degeneration, lumbosacral region: Secondary | ICD-10-CM | POA: Insufficient documentation

## 2012-11-24 DIAGNOSIS — M48061 Spinal stenosis, lumbar region without neurogenic claudication: Secondary | ICD-10-CM | POA: Insufficient documentation

## 2012-11-24 DIAGNOSIS — M51379 Other intervertebral disc degeneration, lumbosacral region without mention of lumbar back pain or lower extremity pain: Secondary | ICD-10-CM | POA: Insufficient documentation

## 2013-01-03 ENCOUNTER — Ambulatory Visit: Payer: BC Managed Care – PPO | Admitting: Internal Medicine

## 2013-03-29 DIAGNOSIS — C4491 Basal cell carcinoma of skin, unspecified: Secondary | ICD-10-CM

## 2013-03-29 HISTORY — DX: Basal cell carcinoma of skin, unspecified: C44.91

## 2013-04-18 ENCOUNTER — Ambulatory Visit: Payer: BC Managed Care – PPO | Admitting: Internal Medicine

## 2013-04-19 ENCOUNTER — Ambulatory Visit: Payer: BC Managed Care – PPO | Admitting: Internal Medicine

## 2013-07-26 HISTORY — PX: TOTAL HIP ARTHROPLASTY: SHX124

## 2013-08-09 DIAGNOSIS — H4010X0 Unspecified open-angle glaucoma, stage unspecified: Secondary | ICD-10-CM | POA: Insufficient documentation

## 2013-11-23 DIAGNOSIS — M47817 Spondylosis without myelopathy or radiculopathy, lumbosacral region: Secondary | ICD-10-CM | POA: Diagnosis not present

## 2013-11-28 DIAGNOSIS — IMO0002 Reserved for concepts with insufficient information to code with codable children: Secondary | ICD-10-CM | POA: Diagnosis not present

## 2013-11-28 DIAGNOSIS — M5137 Other intervertebral disc degeneration, lumbosacral region: Secondary | ICD-10-CM | POA: Diagnosis not present

## 2013-11-28 DIAGNOSIS — M999 Biomechanical lesion, unspecified: Secondary | ICD-10-CM | POA: Diagnosis not present

## 2013-12-05 DIAGNOSIS — M5137 Other intervertebral disc degeneration, lumbosacral region: Secondary | ICD-10-CM | POA: Diagnosis not present

## 2013-12-05 DIAGNOSIS — M999 Biomechanical lesion, unspecified: Secondary | ICD-10-CM | POA: Diagnosis not present

## 2013-12-05 DIAGNOSIS — IMO0002 Reserved for concepts with insufficient information to code with codable children: Secondary | ICD-10-CM | POA: Diagnosis not present

## 2013-12-07 DIAGNOSIS — IMO0002 Reserved for concepts with insufficient information to code with codable children: Secondary | ICD-10-CM | POA: Diagnosis not present

## 2013-12-07 DIAGNOSIS — M999 Biomechanical lesion, unspecified: Secondary | ICD-10-CM | POA: Diagnosis not present

## 2013-12-07 DIAGNOSIS — M5137 Other intervertebral disc degeneration, lumbosacral region: Secondary | ICD-10-CM | POA: Diagnosis not present

## 2013-12-11 DIAGNOSIS — M5137 Other intervertebral disc degeneration, lumbosacral region: Secondary | ICD-10-CM | POA: Diagnosis not present

## 2013-12-11 DIAGNOSIS — M999 Biomechanical lesion, unspecified: Secondary | ICD-10-CM | POA: Diagnosis not present

## 2013-12-11 DIAGNOSIS — IMO0002 Reserved for concepts with insufficient information to code with codable children: Secondary | ICD-10-CM | POA: Diagnosis not present

## 2013-12-13 DIAGNOSIS — M999 Biomechanical lesion, unspecified: Secondary | ICD-10-CM | POA: Diagnosis not present

## 2013-12-13 DIAGNOSIS — IMO0002 Reserved for concepts with insufficient information to code with codable children: Secondary | ICD-10-CM | POA: Diagnosis not present

## 2013-12-13 DIAGNOSIS — M5137 Other intervertebral disc degeneration, lumbosacral region: Secondary | ICD-10-CM | POA: Diagnosis not present

## 2013-12-18 DIAGNOSIS — IMO0002 Reserved for concepts with insufficient information to code with codable children: Secondary | ICD-10-CM | POA: Diagnosis not present

## 2013-12-18 DIAGNOSIS — M999 Biomechanical lesion, unspecified: Secondary | ICD-10-CM | POA: Diagnosis not present

## 2013-12-18 DIAGNOSIS — M5137 Other intervertebral disc degeneration, lumbosacral region: Secondary | ICD-10-CM | POA: Diagnosis not present

## 2014-01-01 DIAGNOSIS — IMO0002 Reserved for concepts with insufficient information to code with codable children: Secondary | ICD-10-CM | POA: Diagnosis not present

## 2014-01-01 DIAGNOSIS — M999 Biomechanical lesion, unspecified: Secondary | ICD-10-CM | POA: Diagnosis not present

## 2014-01-01 DIAGNOSIS — M5137 Other intervertebral disc degeneration, lumbosacral region: Secondary | ICD-10-CM | POA: Diagnosis not present

## 2014-01-16 DIAGNOSIS — M1610 Unilateral primary osteoarthritis, unspecified hip: Secondary | ICD-10-CM | POA: Diagnosis not present

## 2014-01-16 DIAGNOSIS — Z96659 Presence of unspecified artificial knee joint: Secondary | ICD-10-CM | POA: Diagnosis not present

## 2014-01-17 DIAGNOSIS — C61 Malignant neoplasm of prostate: Secondary | ICD-10-CM | POA: Diagnosis not present

## 2014-01-23 DIAGNOSIS — N529 Male erectile dysfunction, unspecified: Secondary | ICD-10-CM | POA: Diagnosis not present

## 2014-01-23 DIAGNOSIS — C61 Malignant neoplasm of prostate: Secondary | ICD-10-CM | POA: Diagnosis not present

## 2014-01-23 DIAGNOSIS — N393 Stress incontinence (female) (male): Secondary | ICD-10-CM | POA: Diagnosis not present

## 2014-01-28 DIAGNOSIS — M7130 Other bursal cyst, unspecified site: Secondary | ICD-10-CM | POA: Diagnosis not present

## 2014-01-28 DIAGNOSIS — M545 Low back pain, unspecified: Secondary | ICD-10-CM | POA: Diagnosis not present

## 2014-01-29 DIAGNOSIS — M5137 Other intervertebral disc degeneration, lumbosacral region: Secondary | ICD-10-CM | POA: Diagnosis not present

## 2014-01-29 DIAGNOSIS — M999 Biomechanical lesion, unspecified: Secondary | ICD-10-CM | POA: Diagnosis not present

## 2014-01-29 DIAGNOSIS — IMO0002 Reserved for concepts with insufficient information to code with codable children: Secondary | ICD-10-CM | POA: Diagnosis not present

## 2014-02-20 DIAGNOSIS — M999 Biomechanical lesion, unspecified: Secondary | ICD-10-CM | POA: Diagnosis not present

## 2014-02-20 DIAGNOSIS — M5137 Other intervertebral disc degeneration, lumbosacral region: Secondary | ICD-10-CM | POA: Diagnosis not present

## 2014-02-20 DIAGNOSIS — IMO0002 Reserved for concepts with insufficient information to code with codable children: Secondary | ICD-10-CM | POA: Diagnosis not present

## 2014-03-05 DIAGNOSIS — IMO0002 Reserved for concepts with insufficient information to code with codable children: Secondary | ICD-10-CM | POA: Diagnosis not present

## 2014-03-05 DIAGNOSIS — M999 Biomechanical lesion, unspecified: Secondary | ICD-10-CM | POA: Diagnosis not present

## 2014-03-05 DIAGNOSIS — M5137 Other intervertebral disc degeneration, lumbosacral region: Secondary | ICD-10-CM | POA: Diagnosis not present

## 2014-04-09 DIAGNOSIS — M999 Biomechanical lesion, unspecified: Secondary | ICD-10-CM | POA: Diagnosis not present

## 2014-04-09 DIAGNOSIS — M5137 Other intervertebral disc degeneration, lumbosacral region: Secondary | ICD-10-CM | POA: Diagnosis not present

## 2014-04-09 DIAGNOSIS — IMO0002 Reserved for concepts with insufficient information to code with codable children: Secondary | ICD-10-CM | POA: Diagnosis not present

## 2014-04-15 DIAGNOSIS — M47812 Spondylosis without myelopathy or radiculopathy, cervical region: Secondary | ICD-10-CM | POA: Diagnosis not present

## 2014-05-02 DIAGNOSIS — H401431 Capsular glaucoma with pseudoexfoliation of lens, bilateral, mild stage: Secondary | ICD-10-CM | POA: Diagnosis not present

## 2014-05-08 DIAGNOSIS — M9901 Segmental and somatic dysfunction of cervical region: Secondary | ICD-10-CM | POA: Diagnosis not present

## 2014-05-08 DIAGNOSIS — M5416 Radiculopathy, lumbar region: Secondary | ICD-10-CM | POA: Diagnosis not present

## 2014-05-08 DIAGNOSIS — M9903 Segmental and somatic dysfunction of lumbar region: Secondary | ICD-10-CM | POA: Diagnosis not present

## 2014-05-08 DIAGNOSIS — M5033 Other cervical disc degeneration, cervicothoracic region: Secondary | ICD-10-CM | POA: Diagnosis not present

## 2014-05-09 DIAGNOSIS — Z0000 Encounter for general adult medical examination without abnormal findings: Secondary | ICD-10-CM | POA: Diagnosis not present

## 2014-05-09 DIAGNOSIS — Z125 Encounter for screening for malignant neoplasm of prostate: Secondary | ICD-10-CM | POA: Diagnosis not present

## 2014-05-15 DIAGNOSIS — Z0000 Encounter for general adult medical examination without abnormal findings: Secondary | ICD-10-CM | POA: Diagnosis not present

## 2014-05-16 DIAGNOSIS — Z1283 Encounter for screening for malignant neoplasm of skin: Secondary | ICD-10-CM | POA: Diagnosis not present

## 2014-05-16 DIAGNOSIS — L821 Other seborrheic keratosis: Secondary | ICD-10-CM | POA: Diagnosis not present

## 2014-05-16 DIAGNOSIS — L570 Actinic keratosis: Secondary | ICD-10-CM | POA: Diagnosis not present

## 2014-05-16 DIAGNOSIS — L738 Other specified follicular disorders: Secondary | ICD-10-CM | POA: Diagnosis not present

## 2014-05-16 DIAGNOSIS — Z85828 Personal history of other malignant neoplasm of skin: Secondary | ICD-10-CM | POA: Diagnosis not present

## 2014-05-16 DIAGNOSIS — L578 Other skin changes due to chronic exposure to nonionizing radiation: Secondary | ICD-10-CM | POA: Diagnosis not present

## 2014-05-16 DIAGNOSIS — L820 Inflamed seborrheic keratosis: Secondary | ICD-10-CM | POA: Diagnosis not present

## 2014-05-16 DIAGNOSIS — D229 Melanocytic nevi, unspecified: Secondary | ICD-10-CM | POA: Diagnosis not present

## 2014-06-04 DIAGNOSIS — M5416 Radiculopathy, lumbar region: Secondary | ICD-10-CM | POA: Diagnosis not present

## 2014-06-04 DIAGNOSIS — M5033 Other cervical disc degeneration, cervicothoracic region: Secondary | ICD-10-CM | POA: Diagnosis not present

## 2014-06-04 DIAGNOSIS — M9903 Segmental and somatic dysfunction of lumbar region: Secondary | ICD-10-CM | POA: Diagnosis not present

## 2014-06-04 DIAGNOSIS — M9901 Segmental and somatic dysfunction of cervical region: Secondary | ICD-10-CM | POA: Diagnosis not present

## 2014-06-10 DIAGNOSIS — Z96642 Presence of left artificial hip joint: Secondary | ICD-10-CM | POA: Diagnosis not present

## 2014-06-10 DIAGNOSIS — M5126 Other intervertebral disc displacement, lumbar region: Secondary | ICD-10-CM | POA: Diagnosis not present

## 2014-06-17 DIAGNOSIS — M7130 Other bursal cyst, unspecified site: Secondary | ICD-10-CM | POA: Diagnosis not present

## 2014-06-17 DIAGNOSIS — M545 Low back pain: Secondary | ICD-10-CM | POA: Diagnosis not present

## 2014-07-09 DIAGNOSIS — M5416 Radiculopathy, lumbar region: Secondary | ICD-10-CM | POA: Diagnosis not present

## 2014-07-09 DIAGNOSIS — M9901 Segmental and somatic dysfunction of cervical region: Secondary | ICD-10-CM | POA: Diagnosis not present

## 2014-07-09 DIAGNOSIS — M5033 Other cervical disc degeneration, cervicothoracic region: Secondary | ICD-10-CM | POA: Diagnosis not present

## 2014-07-09 DIAGNOSIS — M9903 Segmental and somatic dysfunction of lumbar region: Secondary | ICD-10-CM | POA: Diagnosis not present

## 2014-08-06 DIAGNOSIS — M5033 Other cervical disc degeneration, cervicothoracic region: Secondary | ICD-10-CM | POA: Diagnosis not present

## 2014-08-06 DIAGNOSIS — M9901 Segmental and somatic dysfunction of cervical region: Secondary | ICD-10-CM | POA: Diagnosis not present

## 2014-08-06 DIAGNOSIS — M5416 Radiculopathy, lumbar region: Secondary | ICD-10-CM | POA: Diagnosis not present

## 2014-08-06 DIAGNOSIS — M9903 Segmental and somatic dysfunction of lumbar region: Secondary | ICD-10-CM | POA: Diagnosis not present

## 2014-08-07 DIAGNOSIS — C61 Malignant neoplasm of prostate: Secondary | ICD-10-CM | POA: Diagnosis not present

## 2014-08-14 DIAGNOSIS — N529 Male erectile dysfunction, unspecified: Secondary | ICD-10-CM | POA: Diagnosis not present

## 2014-08-14 DIAGNOSIS — C61 Malignant neoplasm of prostate: Secondary | ICD-10-CM | POA: Diagnosis not present

## 2014-08-20 DIAGNOSIS — J069 Acute upper respiratory infection, unspecified: Secondary | ICD-10-CM | POA: Diagnosis not present

## 2014-09-16 DIAGNOSIS — M545 Low back pain: Secondary | ICD-10-CM | POA: Diagnosis not present

## 2014-09-16 DIAGNOSIS — M47817 Spondylosis without myelopathy or radiculopathy, lumbosacral region: Secondary | ICD-10-CM | POA: Diagnosis not present

## 2014-10-03 DIAGNOSIS — M5033 Other cervical disc degeneration, cervicothoracic region: Secondary | ICD-10-CM | POA: Diagnosis not present

## 2014-10-03 DIAGNOSIS — M9903 Segmental and somatic dysfunction of lumbar region: Secondary | ICD-10-CM | POA: Diagnosis not present

## 2014-10-03 DIAGNOSIS — M9901 Segmental and somatic dysfunction of cervical region: Secondary | ICD-10-CM | POA: Diagnosis not present

## 2014-10-03 DIAGNOSIS — M5416 Radiculopathy, lumbar region: Secondary | ICD-10-CM | POA: Diagnosis not present

## 2014-10-07 DIAGNOSIS — M47817 Spondylosis without myelopathy or radiculopathy, lumbosacral region: Secondary | ICD-10-CM | POA: Diagnosis not present

## 2014-11-06 DIAGNOSIS — M5416 Radiculopathy, lumbar region: Secondary | ICD-10-CM | POA: Diagnosis not present

## 2014-11-06 DIAGNOSIS — M9901 Segmental and somatic dysfunction of cervical region: Secondary | ICD-10-CM | POA: Diagnosis not present

## 2014-11-06 DIAGNOSIS — M9903 Segmental and somatic dysfunction of lumbar region: Secondary | ICD-10-CM | POA: Diagnosis not present

## 2014-11-06 DIAGNOSIS — M5033 Other cervical disc degeneration, cervicothoracic region: Secondary | ICD-10-CM | POA: Diagnosis not present

## 2014-12-09 DIAGNOSIS — H401431 Capsular glaucoma with pseudoexfoliation of lens, bilateral, mild stage: Secondary | ICD-10-CM | POA: Diagnosis not present

## 2015-01-06 DIAGNOSIS — M5416 Radiculopathy, lumbar region: Secondary | ICD-10-CM | POA: Diagnosis not present

## 2015-01-06 DIAGNOSIS — M47817 Spondylosis without myelopathy or radiculopathy, lumbosacral region: Secondary | ICD-10-CM | POA: Diagnosis not present

## 2015-01-21 DIAGNOSIS — M9901 Segmental and somatic dysfunction of cervical region: Secondary | ICD-10-CM | POA: Diagnosis not present

## 2015-01-21 DIAGNOSIS — M5033 Other cervical disc degeneration, cervicothoracic region: Secondary | ICD-10-CM | POA: Diagnosis not present

## 2015-01-21 DIAGNOSIS — M9903 Segmental and somatic dysfunction of lumbar region: Secondary | ICD-10-CM | POA: Diagnosis not present

## 2015-01-21 DIAGNOSIS — M5416 Radiculopathy, lumbar region: Secondary | ICD-10-CM | POA: Diagnosis not present

## 2015-02-12 DIAGNOSIS — C61 Malignant neoplasm of prostate: Secondary | ICD-10-CM | POA: Diagnosis not present

## 2015-02-17 DIAGNOSIS — M47817 Spondylosis without myelopathy or radiculopathy, lumbosacral region: Secondary | ICD-10-CM | POA: Diagnosis not present

## 2015-02-17 DIAGNOSIS — M5416 Radiculopathy, lumbar region: Secondary | ICD-10-CM | POA: Diagnosis not present

## 2015-02-17 DIAGNOSIS — M47816 Spondylosis without myelopathy or radiculopathy, lumbar region: Secondary | ICD-10-CM | POA: Diagnosis not present

## 2015-02-17 DIAGNOSIS — M4806 Spinal stenosis, lumbar region: Secondary | ICD-10-CM | POA: Diagnosis not present

## 2015-02-17 DIAGNOSIS — M5126 Other intervertebral disc displacement, lumbar region: Secondary | ICD-10-CM | POA: Diagnosis not present

## 2015-02-18 DIAGNOSIS — M5416 Radiculopathy, lumbar region: Secondary | ICD-10-CM | POA: Diagnosis not present

## 2015-02-18 DIAGNOSIS — M9901 Segmental and somatic dysfunction of cervical region: Secondary | ICD-10-CM | POA: Diagnosis not present

## 2015-02-18 DIAGNOSIS — M5033 Other cervical disc degeneration, cervicothoracic region: Secondary | ICD-10-CM | POA: Diagnosis not present

## 2015-02-18 DIAGNOSIS — M9903 Segmental and somatic dysfunction of lumbar region: Secondary | ICD-10-CM | POA: Diagnosis not present

## 2015-02-19 DIAGNOSIS — C61 Malignant neoplasm of prostate: Secondary | ICD-10-CM | POA: Diagnosis not present

## 2015-02-19 DIAGNOSIS — N529 Male erectile dysfunction, unspecified: Secondary | ICD-10-CM | POA: Diagnosis not present

## 2015-02-26 ENCOUNTER — Other Ambulatory Visit: Payer: Self-pay

## 2015-02-26 DIAGNOSIS — I10 Essential (primary) hypertension: Secondary | ICD-10-CM

## 2015-02-26 MED ORDER — LISINOPRIL 20 MG PO TABS: 20.0000 mg | ORAL_TABLET | Freq: Every day | ORAL | Status: DC

## 2015-03-03 DIAGNOSIS — M545 Low back pain: Secondary | ICD-10-CM | POA: Diagnosis not present

## 2015-03-18 DIAGNOSIS — M9903 Segmental and somatic dysfunction of lumbar region: Secondary | ICD-10-CM | POA: Diagnosis not present

## 2015-03-18 DIAGNOSIS — M5033 Other cervical disc degeneration, cervicothoracic region: Secondary | ICD-10-CM | POA: Diagnosis not present

## 2015-03-18 DIAGNOSIS — M5416 Radiculopathy, lumbar region: Secondary | ICD-10-CM | POA: Diagnosis not present

## 2015-03-18 DIAGNOSIS — M9901 Segmental and somatic dysfunction of cervical region: Secondary | ICD-10-CM | POA: Diagnosis not present

## 2015-03-19 DIAGNOSIS — H401431 Capsular glaucoma with pseudoexfoliation of lens, bilateral, mild stage: Secondary | ICD-10-CM | POA: Diagnosis not present

## 2015-03-19 DIAGNOSIS — H40211 Acute angle-closure glaucoma, right eye: Secondary | ICD-10-CM | POA: Diagnosis not present

## 2015-03-20 DIAGNOSIS — H4010X1 Unspecified open-angle glaucoma, mild stage: Secondary | ICD-10-CM | POA: Diagnosis not present

## 2015-03-20 DIAGNOSIS — H2101 Hyphema, right eye: Secondary | ICD-10-CM | POA: Diagnosis not present

## 2015-03-21 DIAGNOSIS — H401431 Capsular glaucoma with pseudoexfoliation of lens, bilateral, mild stage: Secondary | ICD-10-CM | POA: Diagnosis not present

## 2015-03-21 DIAGNOSIS — H27121 Anterior dislocation of lens, right eye: Secondary | ICD-10-CM | POA: Diagnosis not present

## 2015-03-21 DIAGNOSIS — H2101 Hyphema, right eye: Secondary | ICD-10-CM | POA: Diagnosis not present

## 2015-03-21 DIAGNOSIS — H4010X1 Unspecified open-angle glaucoma, mild stage: Secondary | ICD-10-CM | POA: Diagnosis not present

## 2015-04-15 DIAGNOSIS — M5033 Other cervical disc degeneration, cervicothoracic region: Secondary | ICD-10-CM | POA: Diagnosis not present

## 2015-04-15 DIAGNOSIS — M9903 Segmental and somatic dysfunction of lumbar region: Secondary | ICD-10-CM | POA: Diagnosis not present

## 2015-04-15 DIAGNOSIS — M9901 Segmental and somatic dysfunction of cervical region: Secondary | ICD-10-CM | POA: Diagnosis not present

## 2015-04-15 DIAGNOSIS — M5416 Radiculopathy, lumbar region: Secondary | ICD-10-CM | POA: Diagnosis not present

## 2015-04-22 DIAGNOSIS — T8522XA Displacement of intraocular lens, initial encounter: Secondary | ICD-10-CM | POA: Diagnosis not present

## 2015-04-22 DIAGNOSIS — H4010X0 Unspecified open-angle glaucoma, stage unspecified: Secondary | ICD-10-CM | POA: Diagnosis not present

## 2015-04-22 DIAGNOSIS — H27131 Posterior dislocation of lens, right eye: Secondary | ICD-10-CM | POA: Diagnosis not present

## 2015-04-22 DIAGNOSIS — Z79899 Other long term (current) drug therapy: Secondary | ICD-10-CM | POA: Diagnosis not present

## 2015-04-22 DIAGNOSIS — K219 Gastro-esophageal reflux disease without esophagitis: Secondary | ICD-10-CM | POA: Diagnosis not present

## 2015-04-22 DIAGNOSIS — Z8546 Personal history of malignant neoplasm of prostate: Secondary | ICD-10-CM | POA: Diagnosis not present

## 2015-04-22 DIAGNOSIS — I10 Essential (primary) hypertension: Secondary | ICD-10-CM | POA: Diagnosis not present

## 2015-04-22 DIAGNOSIS — Z7982 Long term (current) use of aspirin: Secondary | ICD-10-CM | POA: Diagnosis not present

## 2015-04-22 DIAGNOSIS — Z87891 Personal history of nicotine dependence: Secondary | ICD-10-CM | POA: Diagnosis not present

## 2015-04-26 HISTORY — PX: EYE SURGERY: SHX253

## 2015-05-20 DIAGNOSIS — M9903 Segmental and somatic dysfunction of lumbar region: Secondary | ICD-10-CM | POA: Diagnosis not present

## 2015-05-20 DIAGNOSIS — M9901 Segmental and somatic dysfunction of cervical region: Secondary | ICD-10-CM | POA: Diagnosis not present

## 2015-05-20 DIAGNOSIS — M5416 Radiculopathy, lumbar region: Secondary | ICD-10-CM | POA: Diagnosis not present

## 2015-05-20 DIAGNOSIS — M5033 Other cervical disc degeneration, cervicothoracic region: Secondary | ICD-10-CM | POA: Diagnosis not present

## 2015-05-21 ENCOUNTER — Telehealth: Payer: Self-pay

## 2015-05-21 DIAGNOSIS — Z125 Encounter for screening for malignant neoplasm of prostate: Secondary | ICD-10-CM

## 2015-05-21 DIAGNOSIS — E785 Hyperlipidemia, unspecified: Secondary | ICD-10-CM

## 2015-05-21 DIAGNOSIS — I10 Essential (primary) hypertension: Secondary | ICD-10-CM

## 2015-05-21 NOTE — Telephone Encounter (Signed)
Patient is requesting a lab slip to pick up on tomorrow or Friday for his physical on Nov 8th. Patient is aware that Dr. Rosanna Randy is out of the office.

## 2015-05-22 NOTE — Telephone Encounter (Signed)
Patient wants you to authorize his labs to be done before his Medicare PE on 06/03/15.  He said you did it last year and, he would like for you to authorize it first thing Monday morning so he can have it done Tues or Wed because he is then going to [place] and does not want to do it the after getting back the day before his PE.  He also said he wants to have everything done including PSA as he has had prostate cancer.  Thanks

## 2015-05-22 NOTE — Telephone Encounter (Signed)
Pt is returning call to [place].  CB#[phone]/MW

## 2015-05-22 NOTE — Telephone Encounter (Signed)
Patient will need to wait until Dr Rosanna Randy is in the office to authorize this order.    [facility]  ED

## 2015-05-26 DIAGNOSIS — N400 Enlarged prostate without lower urinary tract symptoms: Secondary | ICD-10-CM | POA: Insufficient documentation

## 2015-05-26 DIAGNOSIS — E785 Hyperlipidemia, unspecified: Secondary | ICD-10-CM | POA: Insufficient documentation

## 2015-05-26 DIAGNOSIS — Z8661 Personal history of infections of the central nervous system: Secondary | ICD-10-CM | POA: Insufficient documentation

## 2015-05-26 DIAGNOSIS — Z87442 Personal history of urinary calculi: Secondary | ICD-10-CM | POA: Insufficient documentation

## 2015-05-26 DIAGNOSIS — I10 Essential (primary) hypertension: Secondary | ICD-10-CM | POA: Insufficient documentation

## 2015-05-26 DIAGNOSIS — M5416 Radiculopathy, lumbar region: Secondary | ICD-10-CM | POA: Insufficient documentation

## 2015-05-26 DIAGNOSIS — M1990 Unspecified osteoarthritis, unspecified site: Secondary | ICD-10-CM | POA: Insufficient documentation

## 2015-05-26 DIAGNOSIS — K648 Other hemorrhoids: Secondary | ICD-10-CM | POA: Insufficient documentation

## 2015-05-26 NOTE — Telephone Encounter (Signed)
Happy to order routine labs. Insurance  will cover it but Medicare will not cover it. Happy to order but cannot say that they will be covered.

## 2015-05-26 NOTE — Telephone Encounter (Signed)
Lab slip placed up front, pt advised of below on voicemail-aa

## 2015-05-28 DIAGNOSIS — I10 Essential (primary) hypertension: Secondary | ICD-10-CM | POA: Diagnosis not present

## 2015-05-28 DIAGNOSIS — E785 Hyperlipidemia, unspecified: Secondary | ICD-10-CM | POA: Diagnosis not present

## 2015-05-28 DIAGNOSIS — Z125 Encounter for screening for malignant neoplasm of prostate: Secondary | ICD-10-CM | POA: Diagnosis not present

## 2015-05-29 ENCOUNTER — Encounter: Payer: Self-pay | Admitting: Family Medicine

## 2015-05-29 LAB — CBC WITH DIFFERENTIAL/PLATELET
BASOS ABS: 0.1 10*3/uL (ref 0.0–0.2)
Basos: 1 %
EOS (ABSOLUTE): 0.2 10*3/uL (ref 0.0–0.4)
EOS: 3 %
HEMATOCRIT: 43.6 % (ref 37.5–51.0)
HEMOGLOBIN: 14.9 g/dL (ref 12.6–17.7)
IMMATURE GRANS (ABS): 0 10*3/uL (ref 0.0–0.1)
Immature Granulocytes: 0 %
LYMPHS: 25 %
Lymphocytes Absolute: 1.2 10*3/uL (ref 0.7–3.1)
MCH: 32 pg (ref 26.6–33.0)
MCHC: 34.2 g/dL (ref 31.5–35.7)
MCV: 94 fL (ref 79–97)
MONOCYTES: 11 %
Monocytes Absolute: 0.6 10*3/uL (ref 0.1–0.9)
NEUTROS ABS: 2.9 10*3/uL (ref 1.4–7.0)
Neutrophils: 60 %
Platelets: 196 10*3/uL (ref 150–379)
RBC: 4.66 x10E6/uL (ref 4.14–5.80)
RDW: 13.3 % (ref 12.3–15.4)
WBC: 4.8 10*3/uL (ref 3.4–10.8)

## 2015-05-29 LAB — COMPREHENSIVE METABOLIC PANEL
ALBUMIN: 4 g/dL (ref 3.6–4.8)
ALT: 14 IU/L (ref 0–44)
AST: 17 IU/L (ref 0–40)
Albumin/Globulin Ratio: 1.5 (ref 1.1–2.5)
Alkaline Phosphatase: 54 IU/L (ref 39–117)
BUN / CREAT RATIO: 18 (ref 10–22)
BUN: 17 mg/dL (ref 8–27)
Bilirubin Total: 0.4 mg/dL (ref 0.0–1.2)
CO2: 23 mmol/L (ref 18–29)
CREATININE: 0.94 mg/dL (ref 0.76–1.27)
Calcium: 9.2 mg/dL (ref 8.6–10.2)
Chloride: 103 mmol/L (ref 97–106)
GFR, EST AFRICAN AMERICAN: 97 mL/min/{1.73_m2} (ref >59)
GFR, EST NON AFRICAN AMERICAN: 84 mL/min/{1.73_m2} (ref >59)
GLUCOSE: 98 mg/dL (ref 65–99)
Globulin, Total: 2.6 g/dL (ref 1.5–4.5)
Potassium: 4.4 mmol/L (ref 3.5–5.2)
Sodium: 141 mmol/L (ref 136–144)
TOTAL PROTEIN: 6.6 g/dL (ref 6.0–8.5)

## 2015-05-29 LAB — LIPID PANEL WITH LDL/HDL RATIO
Cholesterol, Total: 175 mg/dL (ref 100–199)
HDL: 44 mg/dL (ref >39)
LDL CALC: 107 mg/dL — AB (ref 0–99)
LDL/HDL RATIO: 2.4 ratio (ref 0.0–3.6)
Triglycerides: 120 mg/dL (ref 0–149)
VLDL CHOLESTEROL CAL: 24 mg/dL (ref 5–40)

## 2015-05-29 LAB — PSA

## 2015-05-29 LAB — TSH: TSH: 1.28 u[IU]/mL (ref 0.450–4.500)

## 2015-06-03 ENCOUNTER — Encounter: Payer: Self-pay | Admitting: Family Medicine

## 2015-06-03 ENCOUNTER — Ambulatory Visit (INDEPENDENT_AMBULATORY_CARE_PROVIDER_SITE_OTHER): Payer: Medicare Other | Admitting: Family Medicine

## 2015-06-03 VITALS — BP 128/78 | HR 76 | Temp 98.3°F | Resp 16 | Ht 71.0 in | Wt 205.0 lb

## 2015-06-03 DIAGNOSIS — M5416 Radiculopathy, lumbar region: Secondary | ICD-10-CM

## 2015-06-03 DIAGNOSIS — E785 Hyperlipidemia, unspecified: Secondary | ICD-10-CM | POA: Diagnosis not present

## 2015-06-03 DIAGNOSIS — C61 Malignant neoplasm of prostate: Secondary | ICD-10-CM | POA: Insufficient documentation

## 2015-06-03 DIAGNOSIS — Z0000 Encounter for general adult medical examination without abnormal findings: Secondary | ICD-10-CM

## 2015-06-03 DIAGNOSIS — M5137 Other intervertebral disc degeneration, lumbosacral region: Secondary | ICD-10-CM

## 2015-06-03 DIAGNOSIS — I10 Essential (primary) hypertension: Secondary | ICD-10-CM

## 2015-06-03 DIAGNOSIS — Z8546 Personal history of malignant neoplasm of prostate: Secondary | ICD-10-CM

## 2015-06-03 NOTE — Progress Notes (Signed)
Patient ID: Dennis Steele, male   DOB: 14-Apr-1951, 64 y.o.   MRN: [id number] Patient: Dennis Steele, Male    DOB: [birth year], 64 y.o.   MRN: [id number] Visit Date: 06/03/2015  Today's Provider: [name], MD   Chief Complaint  Patient presents with  . Annual Exam   Subjective:   Dennis Steele is a 64 y.o. male who presents today for his Subsequent Annual Wellness Visit. He feels well. He reports exercising 3 times a week. He reports he is sleeping well.  Patient has mild hyperlipidemia which needs follow-up. Mild urinary issues with BPH. This seems to be slowly progressive. He has osteoarthritis and chronic back pain. It is of note that he had cataract surgery last year and had some complications. He is now followed at [facility] with this and is doing better. EKG- 04/26/14 Colon scopy- 09/09/06-Internal Hemorrhoids, diverticulosis Endo- 12/14/02 Zoster- 05/19/11  Review of Systems  Constitutional: Negative.   HENT: Negative.   Eyes: Positive for redness.  Respiratory: Negative.   Cardiovascular: Negative.   Gastrointestinal: Negative.   Endocrine: Negative.   Genitourinary: Negative.   Musculoskeletal: Negative.   Skin: Negative.   Allergic/Immunologic: Negative.   Neurological: Negative.   Hematological: Negative.   Psychiatric/Behavioral: Negative.     Patient Active Problem List   Diagnosis Date Noted  . Benign fibroma of prostate 05/26/2015  . Internal hemorrhoids 05/26/2015  . Personal history of urinary calculi 05/26/2015  . H/O meningitis 05/26/2015  . BP (high blood pressure) 05/26/2015  . Lumbar radiculopathy 05/26/2015  . Arthritis, degenerative 05/26/2015  . Hyperlipidemia 05/26/2015  . Open-angle glaucoma 08/09/2013  . Lumbar canal stenosis 11/24/2012  . Degeneration of intervertebral disc of lumbosacral region 11/24/2012    Social History   Social History  . Marital Status: Married    Spouse Name: N/A  . Number of Children: N/A  . Years of Education:  N/A   Occupational History  . Not on file.   Social History Main Topics  . Smoking status: Former Smoker    Types: Cigars  . Smokeless tobacco: Never Used     Comment: quit cig 2 yrs ago, social smoking only  . Alcohol Use: Yes     Comment: occasional 3 x week  . Drug Use: No  . Sexual Activity: Not on file   Other Topics Concern  . Not on file   Social History Narrative    Past Surgical History  Procedure Laterality Date  . Anterior cruciate ligament repair  25 yrs ago    not sure which side  . Robot assisted laparoscopic radical prostatectomy  06/19/2012    Procedure: ROBOTIC ASSISTED LAPAROSCOPIC RADICAL PROSTATECTOMY LEVEL 2;  Surgeon: Dutch Gray, MD;  Location: [facility];  Service: Urology;  Laterality: N/A;  . Lymphadenectomy  06/19/2012    Procedure: LYMPHADENECTOMY;  Surgeon: Dutch Gray, MD;  Location: [facility];  Service: Urology;  Laterality: Bilateral;    His family history includes Cancer in his brother, father, and mother; Diabetes in his mother; Heart disease in his mother; Stroke in his mother.    Outpatient Prescriptions Prior to Visit  Medication Sig Dispense Refill  . calcium carbonate (TUMS - DOSED IN MG ELEMENTAL CALCIUM) 500 MG chewable tablet Chew 1 tablet by mouth daily.    [name] Kitchen lisinopril (PRINIVIL,ZESTRIL) 20 MG tablet Take 1 tablet (20 mg total) by mouth daily before breakfast. 90 tablet 0  . valACYclovir (VALTREX) 1000 MG tablet Take 1,000 mg by mouth as needed.  Only for outbreaks of fever blisters    . ciprofloxacin (CIPRO) 500 MG tablet Take 1 tablet (500 mg total) by mouth 2 (two) times daily. Start day prior to office visit for foley removal (Patient not taking: Reported on 06/03/2015) 6 tablet 0  . etodolac (LODINE) 500 MG tablet Take 500 mg by mouth 2 (two) times daily.    [name] Kitchen HYDROcodone-acetaminophen (NORCO) 5-325 MG per tablet Take 1-2 tablets by mouth every 6 (six) hours as needed for pain. (Patient not taking: Reported on 06/03/2015) 30 tablet 0  .  Travoprost, BAK Free, (TRAVATAN) 0.004 % SOLN ophthalmic solution Place 1 drop into both eyes at bedtime.    [name] Kitchen zolpidem (AMBIEN) 5 MG tablet Take 1 tablet (5 mg total) by mouth at bedtime as needed for sleep. (Patient not taking: Reported on 06/03/2015) 10 tablet 0   No facility-administered medications prior to visit.    No Known Allergies  Patient Care Team: Jerrol Banana., MD as PCP - General (Family Medicine)  Objective:   Vitals:  Filed Vitals:   06/03/15 0949  BP: 128/78  Pulse: 76  Temp: 98.3 F (36.8 C)  TempSrc: Oral  Resp: 16  Height: 5\' 11"  (1.803 m)  Weight: 205 lb (92.987 kg)    Physical Exam  Constitutional: He is oriented to person, place, and time. He appears well-developed and well-nourished.  HENT:  Head: Normocephalic and atraumatic.  Right Ear: External ear normal.  Left Ear: External ear normal.  Nose: Nose normal.  Mouth/Throat: Oropharynx is clear and moist.  Eyes: Conjunctivae and EOM are normal. Pupils are equal, round, and reactive to light.  Neck: Normal range of motion. Neck supple.  Cardiovascular: Normal rate, regular rhythm, normal heart sounds and intact distal pulses.   Pulmonary/Chest: Effort normal and breath sounds normal.  Abdominal: Soft. Bowel sounds are normal.  Genitourinary: Prostate normal and penis normal.  Musculoskeletal: Normal range of motion.  Neurological: He is alert and oriented to person, place, and time. He has normal reflexes.  Skin: Skin is warm and dry.  Psychiatric: He has a normal mood and affect. His behavior is normal. Judgment and thought content normal.    Activities of Daily Living In your present state of health, do you have any difficulty performing the following activities: 06/03/2015  Hearing? N  Vision? N  Difficulty concentrating or making decisions? N  Walking or climbing stairs? N  Dressing or bathing? N  Doing errands, shopping? N    Fall Risk Assessment Fall Risk  06/03/2015  Falls  in the past year? No     Depression Screen PHQ 2/9 Scores 06/03/2015  PHQ - 2 Score 0    Cognitive Testing - 6-CIT    Year: 0 points  Month: 0 points  Memorize "Pia Mau, [address], [place]"  Time (within 1 hour:) 0 points  Count backwards from 20: 0  points  Name months of year: 0 points  Repeat Address: 8 points   Total Score: 8/28  Interpretation : Normal (0-7) Abnormal (8-28)    Assessment & Plan:     Annual Wellness Visit  Reviewed patient's Family Medical History Reviewed and updated list of patient's medical providers Assessment of cognitive impairment was done Assessed patient's functional ability Established a written schedule for health screening [place] Completed and Reviewed  Exercise Activities and Dietary recommendations Goals    None       There is no immunization history on file for this patient.  Health Maintenance  Topic Date Due  . Hepatitis C Screening  09/15/1950  . TETANUS/TDAP  12/20/1967  . COLONOSCOPY  12/20/1998  . ZOSTAVAX  12/19/2008  . PNA vac Low Risk Adult (1 of 2 - PCV13) 12/19/2013  . INFLUENZA VACCINE  02/24/2015      Discussed health benefits of physical activity, and encouraged him to engage in regular exercise appropriate for his age and condition.     BPH  Osteoarthritis  Hyperlipidemia  Erectile dysfunction  Hypertension  Open angle glaucoma  Status post anterior cruciate ligament repair 1991, status post lumbar laminectomy 2014,   Status post robotic prostatectomy for prostate cancer 2013  All of these issues are stable and patient needs follow-up lab work for these. This has been done.  Dennis Aschoff MD [facility] 06/03/2015 9:57 AM  ------------------------------------------------------------------------------------------------------------

## 2015-06-09 ENCOUNTER — Other Ambulatory Visit: Payer: Self-pay

## 2015-06-09 DIAGNOSIS — I10 Essential (primary) hypertension: Secondary | ICD-10-CM

## 2015-06-09 DIAGNOSIS — M545 Low back pain: Secondary | ICD-10-CM | POA: Diagnosis not present

## 2015-06-09 MED ORDER — LISINOPRIL 20 MG PO TABS: 20.0000 mg | ORAL_TABLET | Freq: Every day | ORAL | Status: DC

## 2015-06-10 ENCOUNTER — Other Ambulatory Visit: Payer: Self-pay | Admitting: Family Medicine

## 2015-06-10 DIAGNOSIS — M5416 Radiculopathy, lumbar region: Secondary | ICD-10-CM | POA: Diagnosis not present

## 2015-06-10 DIAGNOSIS — M9903 Segmental and somatic dysfunction of lumbar region: Secondary | ICD-10-CM | POA: Diagnosis not present

## 2015-06-10 DIAGNOSIS — M9901 Segmental and somatic dysfunction of cervical region: Secondary | ICD-10-CM | POA: Diagnosis not present

## 2015-06-10 DIAGNOSIS — M5033 Other cervical disc degeneration, cervicothoracic region: Secondary | ICD-10-CM | POA: Diagnosis not present

## 2015-06-10 NOTE — Telephone Encounter (Signed)
[facility] called saying the patient has been calling about the refill on his lisinopril (PRINIVIL,ZESTRIL) 20 MG tablet  Please call pharmacy .[name] KitchenMarland KitchenAltha Harm,  Call back is [phone]    Thanks, Con Memos

## 2015-06-11 ENCOUNTER — Telehealth: Payer: Self-pay

## 2015-06-11 ENCOUNTER — Other Ambulatory Visit: Payer: Self-pay

## 2015-06-11 MED ORDER — ASPIRIN 81 MG PO TABS: 81.0000 mg | ORAL_TABLET | Freq: Every day | ORAL | Status: DC

## 2015-06-11 NOTE — Telephone Encounter (Signed)
Patient states Lisinopril RX has not been sent to [facility]. Patient is requesting this be done ASAP due to being completely out of medication. It appears RX was sent to [facility] instead on 06/09/2015.

## 2015-06-12 ENCOUNTER — Telehealth: Payer: Self-pay | Admitting: Family Medicine

## 2015-06-12 NOTE — Telephone Encounter (Signed)
Richardson Landry calling about pt lisinopril. Med refilled verbally.

## 2015-06-12 NOTE — Telephone Encounter (Signed)
Dennis Steele with [facility] is calling with a question concerning the pt. Dennis Steele would like for one of Dr. Alben Spittle nurse's to return he's call. @ [phone]. Thanks CC

## 2015-07-14 DIAGNOSIS — M7502 Adhesive capsulitis of left shoulder: Secondary | ICD-10-CM | POA: Diagnosis not present

## 2015-07-14 DIAGNOSIS — M75102 Unspecified rotator cuff tear or rupture of left shoulder, not specified as traumatic: Secondary | ICD-10-CM | POA: Diagnosis not present

## 2015-07-14 DIAGNOSIS — M25512 Pain in left shoulder: Secondary | ICD-10-CM | POA: Diagnosis not present

## 2015-07-16 DIAGNOSIS — M9903 Segmental and somatic dysfunction of lumbar region: Secondary | ICD-10-CM | POA: Diagnosis not present

## 2015-07-16 DIAGNOSIS — M9901 Segmental and somatic dysfunction of cervical region: Secondary | ICD-10-CM | POA: Diagnosis not present

## 2015-07-16 DIAGNOSIS — M5416 Radiculopathy, lumbar region: Secondary | ICD-10-CM | POA: Diagnosis not present

## 2015-07-16 DIAGNOSIS — M5033 Other cervical disc degeneration, cervicothoracic region: Secondary | ICD-10-CM | POA: Diagnosis not present

## 2015-07-31 DIAGNOSIS — B009 Herpesviral infection, unspecified: Secondary | ICD-10-CM | POA: Diagnosis not present

## 2015-07-31 DIAGNOSIS — L820 Inflamed seborrheic keratosis: Secondary | ICD-10-CM | POA: Diagnosis not present

## 2015-07-31 DIAGNOSIS — Z85828 Personal history of other malignant neoplasm of skin: Secondary | ICD-10-CM | POA: Diagnosis not present

## 2015-07-31 DIAGNOSIS — D239 Other benign neoplasm of skin, unspecified: Secondary | ICD-10-CM

## 2015-07-31 DIAGNOSIS — L578 Other skin changes due to chronic exposure to nonionizing radiation: Secondary | ICD-10-CM | POA: Diagnosis not present

## 2015-07-31 DIAGNOSIS — Z1283 Encounter for screening for malignant neoplasm of skin: Secondary | ICD-10-CM | POA: Diagnosis not present

## 2015-07-31 DIAGNOSIS — L570 Actinic keratosis: Secondary | ICD-10-CM | POA: Diagnosis not present

## 2015-07-31 DIAGNOSIS — D225 Melanocytic nevi of trunk: Secondary | ICD-10-CM | POA: Diagnosis not present

## 2015-07-31 DIAGNOSIS — D485 Neoplasm of uncertain behavior of skin: Secondary | ICD-10-CM | POA: Diagnosis not present

## 2015-07-31 DIAGNOSIS — D1800 Hemangioma unspecified site: Secondary | ICD-10-CM | POA: Diagnosis not present

## 2015-07-31 DIAGNOSIS — L821 Other seborrheic keratosis: Secondary | ICD-10-CM | POA: Diagnosis not present

## 2015-07-31 DIAGNOSIS — D229 Melanocytic nevi, unspecified: Secondary | ICD-10-CM | POA: Diagnosis not present

## 2015-07-31 DIAGNOSIS — L719 Rosacea, unspecified: Secondary | ICD-10-CM | POA: Diagnosis not present

## 2015-07-31 HISTORY — DX: Other benign neoplasm of skin, unspecified: D23.9

## 2015-08-12 DIAGNOSIS — M9903 Segmental and somatic dysfunction of lumbar region: Secondary | ICD-10-CM | POA: Diagnosis not present

## 2015-08-12 DIAGNOSIS — M5416 Radiculopathy, lumbar region: Secondary | ICD-10-CM | POA: Diagnosis not present

## 2015-08-12 DIAGNOSIS — M9901 Segmental and somatic dysfunction of cervical region: Secondary | ICD-10-CM | POA: Diagnosis not present

## 2015-08-12 DIAGNOSIS — M5033 Other cervical disc degeneration, cervicothoracic region: Secondary | ICD-10-CM | POA: Diagnosis not present

## 2015-08-19 DIAGNOSIS — C61 Malignant neoplasm of prostate: Secondary | ICD-10-CM | POA: Diagnosis not present

## 2015-09-02 DIAGNOSIS — C61 Malignant neoplasm of prostate: Secondary | ICD-10-CM | POA: Diagnosis not present

## 2015-09-02 DIAGNOSIS — N5201 Erectile dysfunction due to arterial insufficiency: Secondary | ICD-10-CM | POA: Diagnosis not present

## 2015-09-02 DIAGNOSIS — Z0000 Encounter for general adult medical examination without abnormal findings: Secondary | ICD-10-CM | POA: Diagnosis not present

## 2015-09-09 DIAGNOSIS — M5033 Other cervical disc degeneration, cervicothoracic region: Secondary | ICD-10-CM | POA: Diagnosis not present

## 2015-09-09 DIAGNOSIS — M5416 Radiculopathy, lumbar region: Secondary | ICD-10-CM | POA: Diagnosis not present

## 2015-09-09 DIAGNOSIS — M9901 Segmental and somatic dysfunction of cervical region: Secondary | ICD-10-CM | POA: Diagnosis not present

## 2015-09-09 DIAGNOSIS — M9903 Segmental and somatic dysfunction of lumbar region: Secondary | ICD-10-CM | POA: Diagnosis not present

## 2015-09-11 ENCOUNTER — Encounter: Payer: Self-pay | Admitting: Family Medicine

## 2015-09-11 ENCOUNTER — Ambulatory Visit (INDEPENDENT_AMBULATORY_CARE_PROVIDER_SITE_OTHER): Payer: Medicare Other | Admitting: Family Medicine

## 2015-09-11 VITALS — BP 120/70 | HR 76 | Temp 98.2°F | Resp 16 | Wt 203.0 lb

## 2015-09-11 DIAGNOSIS — J069 Acute upper respiratory infection, unspecified: Secondary | ICD-10-CM

## 2015-09-11 NOTE — Patient Instructions (Signed)
Upper Respiratory Infection, Adult Most upper respiratory infections (URIs) are a viral infection of the air passages leading to the lungs. A URI affects the nose, throat, and upper air passages. The most common type of URI is nasopharyngitis and is typically referred to as "the common cold." URIs run their course and usually go away on their own. Most of the time, a URI does not require medical attention, but sometimes a bacterial infection in the upper airways can follow a viral infection. This is called a secondary infection. Sinus and middle ear infections are common types of secondary upper respiratory infections. Bacterial pneumonia can also complicate a URI. A URI can worsen asthma and chronic obstructive pulmonary disease (COPD). Sometimes, these complications can require emergency medical care and may be life threatening.  CAUSES Almost all URIs are caused by viruses. A virus is a type of germ and can spread from one person to another.  RISKS FACTORS You may be at risk for a URI if:   You smoke.   You have chronic heart or lung disease.  You have a weakened defense (immune) system.   You are very young or very old.   You have nasal allergies or asthma.  You work in crowded or poorly ventilated areas.  You work in health care facilities or schools. SIGNS AND SYMPTOMS  Symptoms typically develop 2-3 days after you come in contact with a cold virus. Most viral URIs last 7-10 days. However, viral URIs from the influenza virus (flu virus) can last 14-18 days and are typically more severe. Symptoms may include:   Runny or stuffy (congested) nose.   Sneezing.   Cough.   Sore throat.   Headache.   Fatigue.   Fever.   Loss of appetite.   Pain in your forehead, behind your eyes, and over your cheekbones (sinus pain).  Muscle aches.  DIAGNOSIS  Your health care provider may diagnose a URI by:  Physical exam.  Tests to check that your symptoms are not due to  another condition such as:  Strep throat.  Sinusitis.  Pneumonia.  Asthma. TREATMENT  A URI goes away on its own with time. It cannot be cured with medicines, but medicines may be prescribed or recommended to relieve symptoms. Medicines may help:  Reduce your fever.  Reduce your cough.  Relieve nasal congestion. HOME CARE INSTRUCTIONS   Take medicines only as directed by your health care provider.   Gargle warm saltwater or take cough drops to comfort your throat as directed by your health care provider.  Use a warm mist humidifier or inhale steam from a shower to increase air moisture. This may make it easier to breathe.  Drink enough fluid to keep your urine clear or pale yellow.   Eat soups and other clear broths and maintain good nutrition.   Rest as needed.   Return to work when your temperature has returned to normal or as your health care provider advises. You may need to stay home longer to avoid infecting others. You can also use a face mask and careful hand washing to prevent spread of the virus.  Increase the usage of your inhaler if you have asthma.   Do not use any tobacco products, including cigarettes, chewing tobacco, or electronic cigarettes. If you need help quitting, ask your health care provider. PREVENTION  The best way to protect yourself from getting a cold is to practice good hygiene.   Avoid oral or hand contact with people with cold   symptoms.   Wash your hands often if contact occurs.  There is no clear evidence that vitamin C, vitamin E, echinacea, or exercise reduces the chance of developing a cold. However, it is always recommended to get plenty of rest, exercise, and practice good nutrition.  SEEK MEDICAL CARE IF:   You are getting worse rather than better.   Your symptoms are not controlled by medicine.   You have chills.  You have worsening shortness of breath.  You have brown or red mucus.  You have yellow or brown nasal  discharge.  You have pain in your face, especially when you bend forward.  You have a fever.  You have swollen neck glands.  You have pain while swallowing.  You have white areas in the back of your throat. SEEK IMMEDIATE MEDICAL CARE IF:   You have severe or persistent:  Headache.  Ear pain.  Sinus pain.  Chest pain.  You have chronic lung disease and any of the following:  Wheezing.  Prolonged cough.  Coughing up blood.  A change in your usual mucus.  You have a stiff neck.  You have changes in your:  Vision.  Hearing.  Thinking.  Mood. MAKE SURE YOU:   Understand these instructions.  Will watch your condition.  Will get help right away if you are not doing well or get worse.   This information is not intended to replace advice given to you by your health care provider. Make sure you discuss any questions you have with your health care provider.   Document Released: 01/05/2001 Document Revised: 11/26/2014 Document Reviewed: 10/17/2013 Elsevier Interactive Patient Education 2016 Elsevier Inc.  

## 2015-09-11 NOTE — Progress Notes (Signed)
Patient: Dennis Steele Male    DOB: 12-11-1950   65 y.o.   MRN: [id number] Visit Date: 09/11/2015  Today's Provider: Lelon Huh, MD   Chief Complaint  Patient presents with  . Sore Throat   Subjective:    HPI  Sore throat: Patient states for the past 2 days he has had a mild sore throat and felt fatigued. Last night his sore throat worsened. He has tried using antiseptic throat spray and gargling with Sprite. Patient states it hurts to swallow. Patient has also had cold sweats, headache and dry hacking cough. Patient has taken Mucinex. Patient denies body aches.  No meds today    No Known Allergies Previous Medications   ASPIRIN 81 MG TABLET    Take 1 tablet (81 mg total) by mouth daily.   CALCIUM CARBONATE (TUMS - DOSED IN MG ELEMENTAL CALCIUM) 500 MG CHEWABLE TABLET    Chew 1 tablet by mouth daily.   LISINOPRIL (PRINIVIL,ZESTRIL) 20 MG TABLET    Take 1 tablet (20 mg total) by mouth daily before breakfast.   MULTIPLE VITAMIN PO    Take by mouth.   TADALAFIL (CIALIS) 5 MG TABLET    Take by mouth.   TIMOLOL (TIMOPTIC) 0.25 % OPHTHALMIC SOLUTION    Apply to eye.   TRAVOPROST, BAK FREE, (TRAVATAN Z) 0.004 % SOLN OPHTHALMIC SOLUTION    Apply to eye.   VALACYCLOVIR (VALTREX) 1000 MG TABLET    Take 1,000 mg by mouth as needed. Only for outbreaks of fever blisters    Review of Systems  Constitutional: Positive for chills, diaphoresis and fatigue. Negative for fever and appetite change.  HENT: Positive for sinus pressure, sneezing, sore throat, trouble swallowing and voice change. Negative for ear pain, mouth sores, postnasal drip and rhinorrhea.   Eyes: Negative for pain, discharge, redness and itching.  Respiratory: Negative for chest tightness, shortness of breath and wheezing.   Cardiovascular: Negative for chest pain and palpitations.  Gastrointestinal: Negative for nausea, vomiting and abdominal pain.  Neurological: Positive for headaches.    Social History  Substance  Use Topics  . Smoking status: Former Smoker    Types: Cigars  . Smokeless tobacco: Never Used     Comment: quit cig 2 yrs ago, social smoking only  . Alcohol Use: Yes     Comment: occasional 3 x week   Objective:   BP 120/70 mmHg  Pulse 76  Temp(Src) 98.2 F (36.8 C) (Oral)  Resp 16  Wt 203 lb (92.08 kg)  SpO2 97%  Physical Exam  General Appearance:    Alert, cooperative, no distress  HENT:   bilateral TM normal without fluid or infection, neck without nodes, pharynx erythematous without exudate, sinuses nontender and nasal mucosa pale and congested  Eyes:    PERRL, conjunctiva/corneas clear, EOM's intact       Lungs:     Clear to auscultation bilaterally, respirations unlabored  Heart:    Regular rate and rhythm  Neurologic:   Awake, alert, oriented x 3. No apparent focal neurological           defect.       Rapid strep: Negative      Assessment & Plan:     1. Upper respiratory infection Counseled regarding signs and symptoms of viral and bacterial respiratory infections. Advised to call or return for additional evaluation if he develops any sign of bacterial infection, or if current symptoms last longer than 10 days.  Lelon Huh, MD  [facility]

## 2015-09-15 ENCOUNTER — Encounter: Payer: Self-pay | Admitting: Family Medicine

## 2015-09-15 ENCOUNTER — Ambulatory Visit (INDEPENDENT_AMBULATORY_CARE_PROVIDER_SITE_OTHER): Payer: Medicare Other | Admitting: Family Medicine

## 2015-09-15 VITALS — BP 120/70 | HR 82 | Temp 98.6°F | Resp 16 | Ht 71.0 in | Wt 198.0 lb

## 2015-09-15 DIAGNOSIS — J029 Acute pharyngitis, unspecified: Secondary | ICD-10-CM | POA: Diagnosis not present

## 2015-09-15 DIAGNOSIS — R070 Pain in throat: Secondary | ICD-10-CM | POA: Diagnosis not present

## 2015-09-15 LAB — POCT RAPID STREP A (OFFICE): Rapid Strep A Screen: NEGATIVE

## 2015-09-15 NOTE — Progress Notes (Signed)
Patient: Dennis Steele Male    DOB: [birth year]   65 y.o.   MRN: [id number] Visit Date: 09/15/2015  Today's Provider: Lelon Huh, MD   Chief Complaint  Patient presents with  . Sore Throat   Subjective:    Sore Throat  This is a new problem. The current episode started in the past 7 days. The problem has been gradually worsening. The pain is worse on the right (both) side. The maximum temperature recorded prior to his arrival was 100.4 - 100.9 F. The pain is at a severity of 10/10. The pain is severe. Associated symptoms include congestion, coughing, headaches, a hoarse voice and trouble swallowing. Pertinent negatives include no abdominal pain, diarrhea, drooling, ear discharge, ear pain, plugged ear sensation, neck pain, shortness of breath, stridor, swollen glands or vomiting. Associated symptoms comments: constipation. He has had no exposure to strep or mono. He has tried oral narcotic analgesics and gargles for the symptoms. The treatment provided mild relief.    Patient was seen 09/11/2015 for sore throat. Patient was treated for upper respiratory infection. Counseled regarding signs and symptoms of viral and bacterial respiratory infections. Patient states that sore throat symptoms worsened yesterday until he was unable to eat or drink anything. He has been gargling with warm salt water several times a day. He got a good night's sleep last night and feels much better today. Has been able to drink with problems and swallowing is greatly improved. He is concerned because he is supposed to travel for work this week.     No Known Allergies Previous Medications   ASPIRIN 81 MG TABLET    Take 1 tablet (81 mg total) by mouth daily.   CALCIUM CARBONATE (TUMS - DOSED IN MG ELEMENTAL CALCIUM) 500 MG CHEWABLE TABLET    Chew 1 tablet by mouth daily.   LISINOPRIL (PRINIVIL,ZESTRIL) 20 MG TABLET    Take 1 tablet (20 mg total) by mouth daily before breakfast.   MULTIPLE VITAMIN PO    Take  by mouth.   TADALAFIL (CIALIS) 5 MG TABLET    Take by mouth.   TIMOLOL (TIMOPTIC) 0.25 % OPHTHALMIC SOLUTION    Apply to eye.   TRAVOPROST, BAK FREE, (TRAVATAN Z) 0.004 % SOLN OPHTHALMIC SOLUTION    Apply to eye.   VALACYCLOVIR (VALTREX) 1000 MG TABLET    Take 1,000 mg by mouth as needed. Only for outbreaks of fever blisters    Review of Systems  Constitutional: Negative for fever, chills and appetite change.  HENT: Positive for congestion, hoarse voice and trouble swallowing. Negative for drooling, ear discharge and ear pain.   Respiratory: Positive for cough. Negative for chest tightness, shortness of breath, wheezing and stridor.   Cardiovascular: Negative for chest pain and palpitations.  Gastrointestinal: Negative for nausea, vomiting, abdominal pain and diarrhea.  Musculoskeletal: Negative for neck pain.  Neurological: Positive for headaches.    Social History  Substance Use Topics  . Smoking status: Former Smoker    Types: Cigars  . Smokeless tobacco: Never Used     Comment: quit cig 2 yrs ago, social smoking only  . Alcohol Use: Yes     Comment: occasional 3 x week   Objective:   BP 120/70 mmHg  Pulse 82  Temp(Src) 98.6 F (37 C) (Oral)  Resp 16  Ht 5\' 11"  (1.803 m)  Wt 198 lb (89.812 kg)  BMI 27.63 kg/m2  Physical Exam  General Appearance:    Alert, cooperative,  no distress  HENT:   pharynx erythematous without exudate and nasal mucosa congested  Eyes:    PERRL, conjunctiva/corneas clear, EOM's intact       Lungs:     Clear to auscultation bilaterally, respirations unlabored  Heart:    Regular rate and rhythm  Neurologic:   Awake, alert, oriented x 3. No apparent focal neurological           defect.       Results for orders placed or performed in visit on 09/15/15  POCT rapid strep A  Result Value Ref Range   Rapid Strep A Screen Negative Negative       Assessment & Plan:     1. Sore throat Greatly improved today. Counseled he can take OTC ibuprofen  prn. Continue salt water gargles. Counseled on hand hygeine to reduce risk of spreading infections. He is not to go to work if he has any fever over 100.5. Call if symptoms flare back up.  - POCT rapid strep A - Culture, Group A Strep  2. Throat pain  - Culture, Group A Strep       Lelon Huh, MD  [facility]

## 2015-09-15 NOTE — Patient Instructions (Signed)
Take 2 ibuprofen 200mg  every four hours as needed for throat pain.  Call if any more fever over 101, or if throat pain worsens.

## 2015-09-17 ENCOUNTER — Telehealth: Payer: Self-pay

## 2015-09-17 LAB — CULTURE, GROUP A STREP: Strep A Culture: NEGATIVE

## 2015-09-17 NOTE — Telephone Encounter (Signed)
Patient advised as directed below. 

## 2015-09-17 NOTE — Telephone Encounter (Signed)
-----   Message from Birdie Sons, MD sent at 09/17/2015  2:09 PM EST ----- Throat culture is negative. No sign of bacterial infection.

## 2015-09-17 NOTE — Telephone Encounter (Signed)
LMTCB

## 2015-09-22 DIAGNOSIS — M47817 Spondylosis without myelopathy or radiculopathy, lumbosacral region: Secondary | ICD-10-CM | POA: Diagnosis not present

## 2015-09-22 DIAGNOSIS — M25512 Pain in left shoulder: Secondary | ICD-10-CM | POA: Diagnosis not present

## 2015-10-07 DIAGNOSIS — M5416 Radiculopathy, lumbar region: Secondary | ICD-10-CM | POA: Diagnosis not present

## 2015-10-07 DIAGNOSIS — M9903 Segmental and somatic dysfunction of lumbar region: Secondary | ICD-10-CM | POA: Diagnosis not present

## 2015-10-07 DIAGNOSIS — M5033 Other cervical disc degeneration, cervicothoracic region: Secondary | ICD-10-CM | POA: Diagnosis not present

## 2015-10-07 DIAGNOSIS — M9901 Segmental and somatic dysfunction of cervical region: Secondary | ICD-10-CM | POA: Diagnosis not present

## 2015-11-11 DIAGNOSIS — M9903 Segmental and somatic dysfunction of lumbar region: Secondary | ICD-10-CM | POA: Diagnosis not present

## 2015-11-11 DIAGNOSIS — M5033 Other cervical disc degeneration, cervicothoracic region: Secondary | ICD-10-CM | POA: Diagnosis not present

## 2015-11-11 DIAGNOSIS — M9901 Segmental and somatic dysfunction of cervical region: Secondary | ICD-10-CM | POA: Diagnosis not present

## 2015-11-11 DIAGNOSIS — M5416 Radiculopathy, lumbar region: Secondary | ICD-10-CM | POA: Diagnosis not present

## 2015-11-24 DIAGNOSIS — M25512 Pain in left shoulder: Secondary | ICD-10-CM | POA: Diagnosis not present

## 2015-12-17 DIAGNOSIS — M47817 Spondylosis without myelopathy or radiculopathy, lumbosacral region: Secondary | ICD-10-CM | POA: Diagnosis not present

## 2015-12-17 DIAGNOSIS — M25512 Pain in left shoulder: Secondary | ICD-10-CM | POA: Diagnosis not present

## 2015-12-17 DIAGNOSIS — M7542 Impingement syndrome of left shoulder: Secondary | ICD-10-CM | POA: Diagnosis not present

## 2015-12-23 DIAGNOSIS — M25512 Pain in left shoulder: Secondary | ICD-10-CM | POA: Diagnosis not present

## 2015-12-24 DIAGNOSIS — M9903 Segmental and somatic dysfunction of lumbar region: Secondary | ICD-10-CM | POA: Diagnosis not present

## 2015-12-24 DIAGNOSIS — M5416 Radiculopathy, lumbar region: Secondary | ICD-10-CM | POA: Diagnosis not present

## 2015-12-24 DIAGNOSIS — M9901 Segmental and somatic dysfunction of cervical region: Secondary | ICD-10-CM | POA: Diagnosis not present

## 2015-12-24 DIAGNOSIS — M5033 Other cervical disc degeneration, cervicothoracic region: Secondary | ICD-10-CM | POA: Diagnosis not present

## 2015-12-25 DIAGNOSIS — M25512 Pain in left shoulder: Secondary | ICD-10-CM | POA: Diagnosis not present

## 2015-12-29 DIAGNOSIS — M25512 Pain in left shoulder: Secondary | ICD-10-CM | POA: Diagnosis not present

## 2016-01-01 DIAGNOSIS — M25512 Pain in left shoulder: Secondary | ICD-10-CM | POA: Diagnosis not present

## 2016-01-05 DIAGNOSIS — M25512 Pain in left shoulder: Secondary | ICD-10-CM | POA: Diagnosis not present

## 2016-01-06 DIAGNOSIS — M5416 Radiculopathy, lumbar region: Secondary | ICD-10-CM | POA: Diagnosis not present

## 2016-01-06 DIAGNOSIS — M9903 Segmental and somatic dysfunction of lumbar region: Secondary | ICD-10-CM | POA: Diagnosis not present

## 2016-01-06 DIAGNOSIS — M5033 Other cervical disc degeneration, cervicothoracic region: Secondary | ICD-10-CM | POA: Diagnosis not present

## 2016-01-06 DIAGNOSIS — M9901 Segmental and somatic dysfunction of cervical region: Secondary | ICD-10-CM | POA: Diagnosis not present

## 2016-01-08 DIAGNOSIS — M25512 Pain in left shoulder: Secondary | ICD-10-CM | POA: Diagnosis not present

## 2016-01-12 DIAGNOSIS — M25512 Pain in left shoulder: Secondary | ICD-10-CM | POA: Diagnosis not present

## 2016-01-15 DIAGNOSIS — M25512 Pain in left shoulder: Secondary | ICD-10-CM | POA: Diagnosis not present

## 2016-01-19 DIAGNOSIS — M25512 Pain in left shoulder: Secondary | ICD-10-CM | POA: Diagnosis not present

## 2016-01-22 DIAGNOSIS — M25512 Pain in left shoulder: Secondary | ICD-10-CM | POA: Diagnosis not present

## 2016-02-03 DIAGNOSIS — M5416 Radiculopathy, lumbar region: Secondary | ICD-10-CM | POA: Diagnosis not present

## 2016-02-03 DIAGNOSIS — M9901 Segmental and somatic dysfunction of cervical region: Secondary | ICD-10-CM | POA: Diagnosis not present

## 2016-02-03 DIAGNOSIS — M9903 Segmental and somatic dysfunction of lumbar region: Secondary | ICD-10-CM | POA: Diagnosis not present

## 2016-02-03 DIAGNOSIS — M5033 Other cervical disc degeneration, cervicothoracic region: Secondary | ICD-10-CM | POA: Diagnosis not present

## 2016-02-16 ENCOUNTER — Encounter: Payer: Self-pay | Admitting: Family Medicine

## 2016-02-16 ENCOUNTER — Ambulatory Visit (INDEPENDENT_AMBULATORY_CARE_PROVIDER_SITE_OTHER): Payer: Medicare Other | Admitting: Family Medicine

## 2016-02-16 VITALS — BP 126/70 | HR 72 | Temp 97.9°F | Wt 204.0 lb

## 2016-02-16 DIAGNOSIS — T148 Other injury of unspecified body region: Secondary | ICD-10-CM | POA: Diagnosis not present

## 2016-02-16 DIAGNOSIS — W57XXXA Bitten or stung by nonvenomous insect and other nonvenomous arthropods, initial encounter: Secondary | ICD-10-CM

## 2016-02-16 MED ORDER — CEPHALEXIN 500 MG PO CAPS: 500.0000 mg | ORAL_CAPSULE | Freq: Two times a day (BID) | ORAL | 0 refills | Status: DC

## 2016-02-16 MED ORDER — TRIAMCINOLONE ACETONIDE 0.1 % EX CREA: 1.0000 "application " | TOPICAL_CREAM | Freq: Three times a day (TID) | CUTANEOUS | 0 refills | Status: DC

## 2016-02-16 NOTE — Patient Instructions (Signed)
Let us know if not improving or new symptoms. 

## 2016-02-16 NOTE — Progress Notes (Signed)
Subjective:     Patient ID: Dennis Steele, male   DOB: 06-18-1951, 65 y.o.   MRN: [id number]  HPI Chief Complaint  Patient presents with  . Rash    Left leg  States he developed numerous minimally itchy red bumps while at his [place] on or about 7/22. States he shares a bed with his wife who does not have a similar rash. States he mowed his yard and spent time riding in his boat. Has applied hydrocortisone with minimal improvement.  Review of Systems     Objective:   Physical Exam  Constitutional: He appears well-developed and well-nourished. No distress.  Skin:  Several erythematous 3-4 mm. Papules on his left thigh. A few have tiny pustules associated with them, and a few are associated with hair follicles.       Assessment:    1. Insect bites: ? A few with secondary infection. - triamcinolone cream (KENALOG) 0.1 %; Apply 1 application topically 3 (three) times daily. To insect bites  Dispense: 30 g; Refill: 0 - cephALEXin (KEFLEX) 500 MG capsule; Take 1 capsule (500 mg total) by mouth 2 (two) times daily.  Dispense: 14 capsule; Refill: 0    Plan:    Further f/u if not improving.

## 2016-02-23 DIAGNOSIS — M47817 Spondylosis without myelopathy or radiculopathy, lumbosacral region: Secondary | ICD-10-CM | POA: Diagnosis not present

## 2016-03-09 DIAGNOSIS — M5033 Other cervical disc degeneration, cervicothoracic region: Secondary | ICD-10-CM | POA: Diagnosis not present

## 2016-03-09 DIAGNOSIS — M9901 Segmental and somatic dysfunction of cervical region: Secondary | ICD-10-CM | POA: Diagnosis not present

## 2016-03-09 DIAGNOSIS — M5416 Radiculopathy, lumbar region: Secondary | ICD-10-CM | POA: Diagnosis not present

## 2016-03-09 DIAGNOSIS — M9903 Segmental and somatic dysfunction of lumbar region: Secondary | ICD-10-CM | POA: Diagnosis not present

## 2016-03-10 DIAGNOSIS — H401431 Capsular glaucoma with pseudoexfoliation of lens, bilateral, mild stage: Secondary | ICD-10-CM | POA: Diagnosis not present

## 2016-03-24 DIAGNOSIS — M5126 Other intervertebral disc displacement, lumbar region: Secondary | ICD-10-CM | POA: Diagnosis not present

## 2016-03-24 DIAGNOSIS — M545 Low back pain: Secondary | ICD-10-CM | POA: Diagnosis not present

## 2016-03-24 DIAGNOSIS — M25512 Pain in left shoulder: Secondary | ICD-10-CM | POA: Diagnosis not present

## 2016-03-31 DIAGNOSIS — M19012 Primary osteoarthritis, left shoulder: Secondary | ICD-10-CM | POA: Diagnosis not present

## 2016-03-31 DIAGNOSIS — M75122 Complete rotator cuff tear or rupture of left shoulder, not specified as traumatic: Secondary | ICD-10-CM | POA: Diagnosis not present

## 2016-03-31 DIAGNOSIS — M25412 Effusion, left shoulder: Secondary | ICD-10-CM | POA: Diagnosis not present

## 2016-03-31 DIAGNOSIS — S46119A Strain of muscle, fascia and tendon of long head of biceps, unspecified arm, initial encounter: Secondary | ICD-10-CM | POA: Diagnosis not present

## 2016-03-31 DIAGNOSIS — S46812A Strain of other muscles, fascia and tendons at shoulder and upper arm level, left arm, initial encounter: Secondary | ICD-10-CM | POA: Diagnosis not present

## 2016-03-31 DIAGNOSIS — M75102 Unspecified rotator cuff tear or rupture of left shoulder, not specified as traumatic: Secondary | ICD-10-CM | POA: Diagnosis not present

## 2016-03-31 DIAGNOSIS — S46819A Strain of other muscles, fascia and tendons at shoulder and upper arm level, unspecified arm, initial encounter: Secondary | ICD-10-CM | POA: Diagnosis not present

## 2016-04-06 DIAGNOSIS — M9901 Segmental and somatic dysfunction of cervical region: Secondary | ICD-10-CM | POA: Diagnosis not present

## 2016-04-06 DIAGNOSIS — M9903 Segmental and somatic dysfunction of lumbar region: Secondary | ICD-10-CM | POA: Diagnosis not present

## 2016-04-06 DIAGNOSIS — M5033 Other cervical disc degeneration, cervicothoracic region: Secondary | ICD-10-CM | POA: Diagnosis not present

## 2016-04-06 DIAGNOSIS — M5416 Radiculopathy, lumbar region: Secondary | ICD-10-CM | POA: Diagnosis not present

## 2016-04-07 DIAGNOSIS — M75122 Complete rotator cuff tear or rupture of left shoulder, not specified as traumatic: Secondary | ICD-10-CM | POA: Diagnosis not present

## 2016-04-15 ENCOUNTER — Encounter: Payer: Self-pay | Admitting: Family Medicine

## 2016-04-15 ENCOUNTER — Ambulatory Visit (INDEPENDENT_AMBULATORY_CARE_PROVIDER_SITE_OTHER): Payer: Medicare Other | Admitting: Family Medicine

## 2016-04-15 VITALS — BP 116/72 | HR 72 | Resp 16 | Wt 203.0 lb

## 2016-04-15 DIAGNOSIS — I10 Essential (primary) hypertension: Secondary | ICD-10-CM

## 2016-04-15 DIAGNOSIS — E785 Hyperlipidemia, unspecified: Secondary | ICD-10-CM

## 2016-04-15 DIAGNOSIS — Z23 Encounter for immunization: Secondary | ICD-10-CM

## 2016-04-15 DIAGNOSIS — M75100 Unspecified rotator cuff tear or rupture of unspecified shoulder, not specified as traumatic: Secondary | ICD-10-CM | POA: Insufficient documentation

## 2016-04-15 DIAGNOSIS — M75102 Unspecified rotator cuff tear or rupture of left shoulder, not specified as traumatic: Secondary | ICD-10-CM | POA: Diagnosis not present

## 2016-04-15 NOTE — Progress Notes (Signed)
Patient: Dennis Steele Male    DOB: 21-Oct-1950   65 y.o.   MRN: [id number] Visit Date: 04/15/2016  Today's Provider: [name], MD   Chief Complaint  Patient presents with  . Medical Clearance  . Shoulder Pain   Subjective:    Shoulder Pain   The pain is present in the left shoulder. This is a chronic problem. The current episode started more than 1 month ago (Started about 8-9 months ago. ).   Patient comes in today for preop surgical clearance for shoulder arthropathy. He does have shoulder pain for which she is having the surgery. Review of systems is negative for chest pain disc on exertion PND orthopnea no neurologic symptoms or deficit whatsoever.    No Known Allergies   Current Outpatient Prescriptions:  .  aspirin 81 MG tablet, Take 1 tablet (81 mg total) by mouth daily., Disp: 30 tablet, Rfl: 12 .  calcium carbonate (TUMS - DOSED IN MG ELEMENTAL CALCIUM) 500 MG chewable tablet, Chew 1 tablet by mouth daily., Disp: , Rfl:  .  cephALEXin (KEFLEX) 500 MG capsule, Take 1 capsule (500 mg total) by mouth 2 (two) times daily., Disp: 14 capsule, Rfl: 0 .  lisinopril (PRINIVIL,ZESTRIL) 20 MG tablet, Take 1 tablet (20 mg total) by mouth daily before breakfast., Disp: 90 tablet, Rfl: 0 .  MULTIPLE VITAMIN PO, Take by mouth., Disp: , Rfl:  .  tadalafil (CIALIS) 5 MG tablet, Take by mouth., Disp: , Rfl:  .  timolol (TIMOPTIC) 0.25 % ophthalmic solution, Apply to eye., Disp: , Rfl:  .  Travoprost, BAK Free, (TRAVATAN Z) 0.004 % SOLN ophthalmic solution, Apply to eye., Disp: , Rfl:  .  triamcinolone cream (KENALOG) 0.1 %, Apply 1 application topically 3 (three) times daily. To insect bites, Disp: 30 g, Rfl: 0 .  valACYclovir (VALTREX) 1000 MG tablet, Take 1,000 mg by mouth as needed. Only for outbreaks of fever blisters, Disp: , Rfl:   Review of Systems  Constitutional: Negative.   HENT: Negative.   Eyes: Negative.   Respiratory: Negative.   Cardiovascular:  Negative.   Gastrointestinal: Negative.   Endocrine: Negative.   Musculoskeletal: Positive for arthralgias. Negative for back pain, gait problem, joint swelling, myalgias, neck pain and neck stiffness.  Allergic/Immunologic: Negative.   Neurological: Negative.  Negative for dizziness, tremors, seizures, syncope, weakness, light-headedness and headaches.  Psychiatric/Behavioral: Negative.     Social History  Substance Use Topics  . Smoking status: Former Smoker    Types: Cigars  . Smokeless tobacco: Never Used     Comment: quit cig 2 yrs ago, social smoking only  . Alcohol use Yes     Comment: occasional 3 x week   Objective:   There were no vitals taken for this visit.  Physical Exam  Constitutional: He is oriented to person, place, and time. He appears well-developed and well-nourished.  HENT:  Head: Normocephalic and atraumatic.  Right Ear: External ear normal.  Left Ear: External ear normal.  Nose: Nose normal.  Mouth/Throat: Oropharynx is clear and moist.  Eyes: Conjunctivae and EOM are normal. Pupils are equal, round, and reactive to light.  Neck: Normal range of motion. Neck supple.  Cardiovascular: Normal rate, regular rhythm, normal heart sounds and intact distal pulses.   Pulmonary/Chest: Effort normal and breath sounds normal.  Abdominal: Soft. Bowel sounds are normal.  Musculoskeletal: Normal range of motion.  Neurological: He is alert and oriented to person, place, and  time.  Skin: Skin is warm and dry.  Psychiatric: He has a normal mood and affect. His behavior is normal. Judgment and thought content normal.        Assessment & Plan:     1. Rotator cuff tear, left Patient medically cleared for shoulder surgery.  2. Essential hypertension  - EKG 12-Lead - Comprehensive metabolic panel - Lipid panel - TSH - CBC with Differential/Platelet   3. Hyperlipidemia  - Comprehensive metabolic panel - Lipid panel - TSH - CBC with  Differential/Platelet  4. Need for influenza vaccination  - Flu vaccine HIGH DOSE PF (Fluzone High dose)    Patient was seen and examined by Miguel Aschoff, MD, and note scribed by Ashley Royalty, CMA.    Richard Cranford Mon, MD  [facility]

## 2016-04-20 DIAGNOSIS — I10 Essential (primary) hypertension: Secondary | ICD-10-CM | POA: Diagnosis not present

## 2016-04-20 DIAGNOSIS — E785 Hyperlipidemia, unspecified: Secondary | ICD-10-CM | POA: Diagnosis not present

## 2016-04-21 LAB — CBC WITH DIFFERENTIAL/PLATELET
BASOS ABS: 0 10*3/uL (ref 0.0–0.2)
BASOS: 1 %
EOS (ABSOLUTE): 0.1 10*3/uL (ref 0.0–0.4)
Eos: 2 %
Hematocrit: 44.2 % (ref 37.5–51.0)
Hemoglobin: 14.5 g/dL (ref 12.6–17.7)
Immature Grans (Abs): 0 10*3/uL (ref 0.0–0.1)
Immature Granulocytes: 1 %
LYMPHS ABS: 1.6 10*3/uL (ref 0.7–3.1)
LYMPHS: 30 %
MCH: 31.4 pg (ref 26.6–33.0)
MCHC: 32.8 g/dL (ref 31.5–35.7)
MCV: 96 fL (ref 79–97)
MONOS ABS: 0.5 10*3/uL (ref 0.1–0.9)
Monocytes: 9 %
NEUTROS ABS: 3.2 10*3/uL (ref 1.4–7.0)
Neutrophils: 57 %
PLATELETS: 218 10*3/uL (ref 150–379)
RBC: 4.62 x10E6/uL (ref 4.14–5.80)
RDW: 13.4 % (ref 12.3–15.4)
WBC: 5.4 10*3/uL (ref 3.4–10.8)

## 2016-04-21 LAB — LIPID PANEL
CHOL/HDL RATIO: 4.4 ratio (ref 0.0–5.0)
Cholesterol, Total: 210 mg/dL — ABNORMAL HIGH (ref 100–199)
HDL: 48 mg/dL (ref >39)
LDL CALC: 136 mg/dL — AB (ref 0–99)
TRIGLYCERIDES: 128 mg/dL (ref 0–149)
VLDL CHOLESTEROL CAL: 26 mg/dL (ref 5–40)

## 2016-04-21 LAB — COMPREHENSIVE METABOLIC PANEL
A/G RATIO: 1.5 (ref 1.2–2.2)
ALT: 17 IU/L (ref 0–44)
AST: 17 IU/L (ref 0–40)
Albumin: 4.1 g/dL (ref 3.6–4.8)
Alkaline Phosphatase: 54 IU/L (ref 39–117)
BUN/Creatinine Ratio: 18 (ref 10–24)
BUN: 18 mg/dL (ref 8–27)
Bilirubin Total: 0.4 mg/dL (ref 0.0–1.2)
CALCIUM: 9.6 mg/dL (ref 8.6–10.2)
CO2: 25 mmol/L (ref 18–29)
CREATININE: 0.98 mg/dL (ref 0.76–1.27)
Chloride: 103 mmol/L (ref 96–106)
GFR, EST AFRICAN AMERICAN: 92 mL/min/{1.73_m2} (ref >59)
GFR, EST NON AFRICAN AMERICAN: 79 mL/min/{1.73_m2} (ref >59)
Globulin, Total: 2.7 g/dL (ref 1.5–4.5)
Glucose: 99 mg/dL (ref 65–99)
POTASSIUM: 4.4 mmol/L (ref 3.5–5.2)
Sodium: 142 mmol/L (ref 134–144)
TOTAL PROTEIN: 6.8 g/dL (ref 6.0–8.5)

## 2016-04-21 LAB — TSH: TSH: 1.63 u[IU]/mL (ref 0.450–4.500)

## 2016-05-04 DIAGNOSIS — M75122 Complete rotator cuff tear or rupture of left shoulder, not specified as traumatic: Secondary | ICD-10-CM | POA: Diagnosis not present

## 2016-05-04 DIAGNOSIS — M75112 Incomplete rotator cuff tear or rupture of left shoulder, not specified as traumatic: Secondary | ICD-10-CM | POA: Diagnosis not present

## 2016-05-04 DIAGNOSIS — I10 Essential (primary) hypertension: Secondary | ICD-10-CM | POA: Diagnosis not present

## 2016-05-04 DIAGNOSIS — Z8546 Personal history of malignant neoplasm of prostate: Secondary | ICD-10-CM | POA: Diagnosis not present

## 2016-05-04 DIAGNOSIS — Z87891 Personal history of nicotine dependence: Secondary | ICD-10-CM | POA: Diagnosis not present

## 2016-05-04 DIAGNOSIS — Z96649 Presence of unspecified artificial hip joint: Secondary | ICD-10-CM | POA: Diagnosis not present

## 2016-05-21 DIAGNOSIS — M25512 Pain in left shoulder: Secondary | ICD-10-CM | POA: Diagnosis not present

## 2016-05-24 DIAGNOSIS — M25512 Pain in left shoulder: Secondary | ICD-10-CM | POA: Diagnosis not present

## 2016-05-27 DIAGNOSIS — M25512 Pain in left shoulder: Secondary | ICD-10-CM | POA: Diagnosis not present

## 2016-06-01 DIAGNOSIS — M25512 Pain in left shoulder: Secondary | ICD-10-CM | POA: Diagnosis not present

## 2016-06-04 DIAGNOSIS — M25512 Pain in left shoulder: Secondary | ICD-10-CM | POA: Diagnosis not present

## 2016-06-07 DIAGNOSIS — M25512 Pain in left shoulder: Secondary | ICD-10-CM | POA: Diagnosis not present

## 2016-06-08 ENCOUNTER — Ambulatory Visit (INDEPENDENT_AMBULATORY_CARE_PROVIDER_SITE_OTHER): Payer: Medicare Other | Admitting: Family Medicine

## 2016-06-08 ENCOUNTER — Encounter: Payer: Self-pay | Admitting: Family Medicine

## 2016-06-08 VITALS — BP 128/70 | HR 74 | Temp 97.7°F | Resp 16 | Ht 71.0 in | Wt 200.0 lb

## 2016-06-08 DIAGNOSIS — I10 Essential (primary) hypertension: Secondary | ICD-10-CM

## 2016-06-08 DIAGNOSIS — Z0000 Encounter for general adult medical examination without abnormal findings: Secondary | ICD-10-CM

## 2016-06-08 DIAGNOSIS — M5137 Other intervertebral disc degeneration, lumbosacral region: Secondary | ICD-10-CM

## 2016-06-08 DIAGNOSIS — Z23 Encounter for immunization: Secondary | ICD-10-CM

## 2016-06-08 DIAGNOSIS — Z1211 Encounter for screening for malignant neoplasm of colon: Secondary | ICD-10-CM | POA: Diagnosis not present

## 2016-06-08 DIAGNOSIS — E7800 Pure hypercholesterolemia, unspecified: Secondary | ICD-10-CM | POA: Diagnosis not present

## 2016-06-08 NOTE — Progress Notes (Signed)
Patient: Dennis Steele, Male    DOB: 1950/08/10, 65 y.o.   MRN: [id number] Visit Date: 06/08/2016  Today's Provider: [name], MD   Chief Complaint  Patient presents with  . Medicare Wellness   Subjective:   Dennis Steele is a 65 y.o. male who presents today for his Subsequent Annual Wellness Visit. He feels fairly well. He reports he is not exercising, as he just had rotator cuff surgery. He reports he is sleeping well. He is recovering well from left rotator cuff surgery--off of narcotics quickly. Colonoscopy- 09/09/2006 Diverticulosis and internal hemorrhoids  Hep C screening- 10/24/12 neg  Immunization History  Administered Date(s) Administered  . Influenza, High Dose Seasonal PF 04/15/2016  . Zoster 05/19/2011      Review of Systems  Constitutional: Negative.   HENT: Negative.   Eyes: Negative.   Respiratory: Negative.   Cardiovascular: Negative.   Gastrointestinal: Negative.   Endocrine: Negative.   Genitourinary: Negative.   Musculoskeletal: Negative.   Skin: Negative.   Allergic/Immunologic: Negative.   Neurological: Negative.   Hematological: Negative.   Psychiatric/Behavioral: Negative.     Patient Active Problem List   Diagnosis Date Noted  . Rotator cuff tear 04/15/2016  . Prostate cancer ([place]) 06/03/2015  . Benign fibroma of prostate 05/26/2015  . Internal hemorrhoids 05/26/2015  . Personal history of urinary calculi 05/26/2015  . H/O meningitis 05/26/2015  . BP (high blood pressure) 05/26/2015  . Lumbar radiculopathy 05/26/2015  . Arthritis, degenerative 05/26/2015  . Hyperlipidemia 05/26/2015  . Open-angle glaucoma 08/09/2013  . Lumbar canal stenosis 11/24/2012  . Degeneration of intervertebral disc of lumbosacral region 11/24/2012    Social History   Social History  . Marital status: Married    Spouse name: N/A  . Number of children: N/A  . Years of education: N/A   Occupational History  . Not on file.   Social History Main  Topics  . Smoking status: Former Smoker    Types: Cigars  . Smokeless tobacco: Never Used     Comment: quit cig 2 yrs ago, social smoking only  . Alcohol use Yes     Comment: occasional 3 x week  . Drug use: No  . Sexual activity: Not on file   Other Topics Concern  . Not on file   Social History Narrative  . No narrative on file    Past Surgical History:  Procedure Laterality Date  . ANTERIOR CRUCIATE LIGAMENT REPAIR  25 yrs ago   not sure which side  . EYE SURGERY  04/2015   lens surgery from a dislodged les from cataract surgery  . LYMPHADENECTOMY  06/19/2012   Procedure: LYMPHADENECTOMY;  Surgeon: Dutch Gray, MD;  Location: [facility];  Service: Urology;  Laterality: Bilateral;  . ROBOT ASSISTED LAPAROSCOPIC RADICAL PROSTATECTOMY  06/19/2012   Procedure: ROBOTIC ASSISTED LAPAROSCOPIC RADICAL PROSTATECTOMY LEVEL 2;  Surgeon: Dutch Gray, MD;  Location: [facility];  Service: Urology;  Laterality: N/A;    His family history includes Cancer in his brother, father, and mother; Diabetes in his mother; Heart disease in his mother; Stroke in his mother.     Outpatient Medications Prior to Visit  Medication Sig Dispense Refill  . aspirin 81 MG tablet Take 1 tablet (81 mg total) by mouth daily. 30 tablet 12  . calcium carbonate (TUMS - DOSED IN MG ELEMENTAL CALCIUM) 500 MG chewable tablet Chew 1 tablet by mouth daily.    [name] Kitchen lisinopril (PRINIVIL,ZESTRIL) 20 MG tablet Take 1 tablet (20  mg total) by mouth daily before breakfast. 90 tablet 0  . MULTIPLE VITAMIN PO Take by mouth.    . tadalafil (CIALIS) 5 MG tablet Take by mouth.    . timolol (TIMOPTIC) 0.25 % ophthalmic solution Apply to eye.    . Travoprost, BAK Free, (TRAVATAN Z) 0.004 % SOLN ophthalmic solution Apply to eye.    . triamcinolone cream (KENALOG) 0.1 % Apply 1 application topically 3 (three) times daily. To insect bites 30 g 0  . valACYclovir (VALTREX) 1000 MG tablet Take 1,000 mg by mouth as needed. Only for outbreaks of fever  blisters     No facility-administered medications prior to visit.     No Known Allergies  Patient Care Team: Jerrol Banana., MD as PCP - General (Family Medicine)   Objective:   Vitals:  Vitals:   06/08/16 0920  BP: 128/70  Pulse: 74  Resp: 16  Temp: 97.7 F (36.5 C)  TempSrc: Oral  Weight: 200 lb (90.7 kg)  Height: 5\' 11"  (1.803 m)    Physical Exam  Constitutional: He is oriented to person, place, and time. He appears well-developed and well-nourished.  HENT:  Head: Normocephalic and atraumatic.  Right Ear: External ear normal.  Left Ear: External ear normal.  Nose: Nose normal.  Mouth/Throat: Oropharynx is clear and moist.  Eyes: Conjunctivae and EOM are normal. Pupils are equal, round, and reactive to light.  Neck: Normal range of motion. Neck supple.  Cardiovascular: Normal rate, regular rhythm, normal heart sounds and intact distal pulses.   Pulmonary/Chest: Effort normal and breath sounds normal.  Abdominal: Soft. Bowel sounds are normal.  Genitourinary: Rectum normal, prostate normal and penis normal.  Musculoskeletal: Normal range of motion.  Surgical scar on left shoulder.  Neurological: He is alert and oriented to person, place, and time. He has normal reflexes.  Skin: Skin is warm and dry.  Psychiatric: He has a normal mood and affect. His behavior is normal. Judgment and thought content normal.    Activities of Daily Living In your present state of health, do you have any difficulty performing the following activities: 06/08/2016  Hearing? N  Vision? N  Difficulty concentrating or making decisions? N  Walking or climbing stairs? N  Dressing or bathing? N  Doing errands, shopping? N  Some recent data might be hidden    Fall Risk Assessment Fall Risk  06/08/2016 06/03/2015  Falls in the past year? No No     Depression Screen PHQ 2/9 Scores 06/08/2016 06/03/2015  PHQ - 2 Score 0 0    Cognitive Testing - 6-CIT    Year: 0  points  Month: 0 points  Memorize "Pia Mau, [address] [place]"  Time (within 1 hour:) 0 points  Count backwards from 20: 0 points  Name months of year: 0 points  Repeat Address: 0 points   Total Score: 0/28  Interpretation : Normal (0-7) Abnormal (8-28)    Assessment & Plan:     Annual Wellness Visit  Reviewed patient's Family Medical History Reviewed and updated list of patient's medical providers Assessment of cognitive impairment was done Assessed patient's functional ability Established a written schedule for health screening [place] Completed and Reviewed  Exercise Activities and Dietary recommendations Goals    None      Immunization History  Administered Date(s) Administered  . Influenza, High Dose Seasonal PF 04/15/2016  . Zoster 05/19/2011    Health Maintenance  Topic Date Due  . Hepatitis C  Screening  Nov 09, 1950  . TETANUS/TDAP  12/20/1967  . COLONOSCOPY  12/20/1998  . ZOSTAVAX  12/19/2008  . PNA vac Low Risk Adult (1 of 2 - PCV13) 12/19/2013  . INFLUENZA VACCINE  Completed    Refer to Dr Tiffany Kocher for f/u 2008 colonoscopy. Discussed health benefits of physical activity, and encouraged him to engage in regular exercise appropriate for his age and condition.  HTN HLD Prostate Cancer Internal Hemorrhoids Open angle Glaucoma H/o kidney stones Recent left Rotator cuff surgery DDD  I have done the exam and reviewed the above chart and it is accurate to the best of my knowledge. Development worker, community has been used in this note in any air is in the dictation or transcription are unintentional.  [facility] 06/08/2016 9:25 AM  ------------------------------------------------------------------------------------------------------------

## 2016-06-10 DIAGNOSIS — M25512 Pain in left shoulder: Secondary | ICD-10-CM | POA: Diagnosis not present

## 2016-06-11 ENCOUNTER — Other Ambulatory Visit: Payer: Self-pay | Admitting: Family Medicine

## 2016-06-11 DIAGNOSIS — I10 Essential (primary) hypertension: Secondary | ICD-10-CM

## 2016-06-14 DIAGNOSIS — M25512 Pain in left shoulder: Secondary | ICD-10-CM | POA: Diagnosis not present

## 2016-06-16 DIAGNOSIS — M25512 Pain in left shoulder: Secondary | ICD-10-CM | POA: Diagnosis not present

## 2016-06-21 DIAGNOSIS — M25512 Pain in left shoulder: Secondary | ICD-10-CM | POA: Diagnosis not present

## 2016-06-23 DIAGNOSIS — M47817 Spondylosis without myelopathy or radiculopathy, lumbosacral region: Secondary | ICD-10-CM | POA: Diagnosis not present

## 2016-06-24 DIAGNOSIS — M25512 Pain in left shoulder: Secondary | ICD-10-CM | POA: Diagnosis not present

## 2016-06-24 DIAGNOSIS — M5033 Other cervical disc degeneration, cervicothoracic region: Secondary | ICD-10-CM | POA: Diagnosis not present

## 2016-06-24 DIAGNOSIS — M9903 Segmental and somatic dysfunction of lumbar region: Secondary | ICD-10-CM | POA: Diagnosis not present

## 2016-06-24 DIAGNOSIS — M5416 Radiculopathy, lumbar region: Secondary | ICD-10-CM | POA: Diagnosis not present

## 2016-06-24 DIAGNOSIS — M9901 Segmental and somatic dysfunction of cervical region: Secondary | ICD-10-CM | POA: Diagnosis not present

## 2016-06-28 DIAGNOSIS — M25512 Pain in left shoulder: Secondary | ICD-10-CM | POA: Diagnosis not present

## 2016-07-01 DIAGNOSIS — M25512 Pain in left shoulder: Secondary | ICD-10-CM | POA: Diagnosis not present

## 2016-07-08 DIAGNOSIS — M25512 Pain in left shoulder: Secondary | ICD-10-CM | POA: Diagnosis not present

## 2016-07-12 DIAGNOSIS — M25512 Pain in left shoulder: Secondary | ICD-10-CM | POA: Diagnosis not present

## 2016-07-14 DIAGNOSIS — M25512 Pain in left shoulder: Secondary | ICD-10-CM | POA: Diagnosis not present

## 2016-07-21 ENCOUNTER — Encounter: Payer: Self-pay | Admitting: Physician Assistant

## 2016-07-21 ENCOUNTER — Telehealth: Payer: Self-pay

## 2016-07-21 ENCOUNTER — Ambulatory Visit (INDEPENDENT_AMBULATORY_CARE_PROVIDER_SITE_OTHER): Payer: Medicare Other | Admitting: Physician Assistant

## 2016-07-21 VITALS — BP 132/70 | HR 86 | Temp 98.1°F | Resp 14 | Wt 197.0 lb

## 2016-07-21 DIAGNOSIS — J069 Acute upper respiratory infection, unspecified: Secondary | ICD-10-CM

## 2016-07-21 MED ORDER — DOXYCYCLINE HYCLATE 100 MG PO TABS: 100.0000 mg | ORAL_TABLET | Freq: Two times a day (BID) | ORAL | 0 refills | Status: DC

## 2016-07-21 MED ORDER — BENZONATATE 100 MG PO CAPS: 100.0000 mg | ORAL_CAPSULE | Freq: Three times a day (TID) | ORAL | 0 refills | Status: AC | PRN

## 2016-07-21 NOTE — Progress Notes (Signed)
Patient: Dennis Steele Male    DOB: 24-Jan-1951   65 y.o.   MRN: [id number] Visit Date: 07/21/2016  Today's Provider: Trinna Post, PA-C   Chief Complaint  Patient presents with  . URI   Subjective:    HPI Pt is here for URI symptoms. Started about 5 days ago. Started with a cough that is now productive with yellow/green sputum. Felt to warm to his wife. He has sinus pain, pressure/pain, sore throat. Denies chills, body aches. Pt has been taking his wifes cough syrup, Hydrocodone/Chlorphen.    No Known Allergies   Current Outpatient Prescriptions:  .  aspirin 81 MG tablet, Take 1 tablet (81 mg total) by mouth daily., Disp: 30 tablet, Rfl: 12 .  calcium carbonate (TUMS - DOSED IN MG ELEMENTAL CALCIUM) 500 MG chewable tablet, Chew 1 tablet by mouth daily., Disp: , Rfl:  .  lisinopril (PRINIVIL,ZESTRIL) 20 MG tablet, TAKE ONE TABLET BY MOUTH EVERY DAY, Disp: 90 tablet, Rfl: 3 .  MULTIPLE VITAMIN PO, Take by mouth., Disp: , Rfl:  .  tadalafil (CIALIS) 5 MG tablet, Take by mouth., Disp: , Rfl:  .  timolol (TIMOPTIC) 0.25 % ophthalmic solution, Apply to eye., Disp: , Rfl:  .  Travoprost, BAK Free, (TRAVATAN Z) 0.004 % SOLN ophthalmic solution, Apply to eye., Disp: , Rfl:  .  triamcinolone cream (KENALOG) 0.1 %, Apply 1 application topically 3 (three) times daily. To insect bites, Disp: 30 g, Rfl: 0 .  valACYclovir (VALTREX) 1000 MG tablet, Take 1,000 mg by mouth as needed. Only for outbreaks of fever blisters, Disp: , Rfl:  .  benzonatate (TESSALON) 100 MG capsule, Take 1 capsule (100 mg total) by mouth 3 (three) times daily as needed for cough., Disp: 20 capsule, Rfl: 0 .  doxycycline (VIBRA-TABS) 100 MG tablet, Take 1 tablet (100 mg total) by mouth 2 (two) times daily., Disp: 20 tablet, Rfl: 0  Review of Systems  Constitutional: Positive for fatigue.  HENT: Positive for congestion, rhinorrhea, sinus pain, sinus pressure and sore throat.   Eyes: Negative.   Respiratory:  Positive for cough.   Cardiovascular: Negative.   Gastrointestinal: Negative.   Endocrine: Negative.   Genitourinary: Negative.   Musculoskeletal: Negative.   Skin: Negative.   Neurological: Negative.   Hematological: Negative.   Psychiatric/Behavioral: Negative.     Social History  Substance Use Topics  . Smoking status: Former Smoker    Types: Cigars  . Smokeless tobacco: Never Used     Comment: quit cig 2 yrs ago, social smoking only  . Alcohol use Yes     Comment: occasional 3 x week   Objective:   BP 132/70 (BP Location: Right Arm, Patient Position: Sitting, Cuff Size: Normal)   Pulse 86   Temp 98.1 F (36.7 C) (Oral)   Resp 14   Wt 197 lb (89.4 kg)   SpO2 98%   BMI 27.48 kg/m   Physical Exam  Constitutional: He is oriented to person, place, and time. He appears well-developed and well-nourished. No distress.  HENT:  Right Ear: Tympanic membrane and external ear normal.  Left Ear: Tympanic membrane and external ear normal.  Nose: Rhinorrhea present. Right sinus exhibits no maxillary sinus tenderness and no frontal sinus tenderness. Left sinus exhibits no maxillary sinus tenderness and no frontal sinus tenderness.  Mouth/Throat: Uvula is midline, oropharynx is clear and moist and mucous membranes are normal. No oropharyngeal exudate.  Clear rhinorrhea  Eyes: Conjunctivae are  normal. Right eye exhibits discharge. Left eye exhibits discharge.  Watery Discharge   Neck: Normal range of motion. Neck supple.  Cardiovascular: Normal rate and regular rhythm.   Pulmonary/Chest: Effort normal and breath sounds normal. No respiratory distress. He has no wheezes. He has no rales.  Lymphadenopathy:    He has no cervical adenopathy.  Neurological: He is alert and oriented to person, place, and time.  Skin: Skin is warm and dry. He is not diaphoretic.  Psychiatric: He has a normal mood and affect. His behavior is normal.        Assessment & Plan:     1. Upper respiratory  tract infection, unspecified type  Do think it's likely viral at this point. Instructed patient not to use decongestants like Sudafed 2/2 high blood pressure. Treat symptomatically for now. Patient does have 23 mo old grandson staying with him. Gave hard script for doxycycline to be taken starting 07/23/2016 if worsening. May use 2nd gen antihistamine for PND.  - benzonatate (TESSALON) 100 MG capsule; Take 1 capsule (100 mg total) by mouth 3 (three) times daily as needed for cough.  Dispense: 20 capsule; Refill: 0 - doxycycline (VIBRA-TABS) 100 MG tablet; Take 1 tablet (100 mg total) by mouth 2 (two) times daily.  Dispense: 20 tablet; Refill: 0  The entirety of the information documented in the History of Present Illness, Review of Systems and Physical Exam were personally obtained by me. Portions of this information were initially documented by [place] and reviewed by me for thoroughness and accuracy.   No Follow-up on file.  Patient Instructions  Upper Respiratory Infection, Adult Most upper respiratory infections (URIs) are caused by a virus. A URI affects the nose, throat, and upper air passages. The most common type of URI is often called "the common cold." Follow these instructions at home:  Take medicines only as told by your doctor.  Gargle warm saltwater or take cough drops to comfort your throat as told by your doctor.  Use a warm mist humidifier or inhale steam from a shower to increase air moisture. This may make it easier to breathe.  Drink enough fluid to keep your pee (urine) clear or pale yellow.  Eat soups and other clear broths.  Have a healthy diet.  Rest as needed.  Go back to work when your fever is gone or your doctor says it is okay.  You may need to stay home longer to avoid giving your URI to others.  You can also wear a face mask and wash your hands often to prevent spread of the virus.  Use your inhaler more if you have asthma.  Do not use any  tobacco products, including cigarettes, chewing tobacco, or electronic cigarettes. If you need help quitting, ask your doctor. Contact a doctor if:  You are getting worse, not better.  Your symptoms are not helped by medicine.  You have chills.  You are getting more short of breath.  You have brown or red mucus.  You have yellow or brown discharge from your nose.  You have pain in your face, especially when you bend forward.  You have a fever.  You have puffy (swollen) neck glands.  You have pain while swallowing.  You have white areas in the back of your throat. Get help right away if:  You have very bad or constant:  Headache.  Ear pain.  Pain in your forehead, behind your eyes, and over your cheekbones (sinus pain).  Chest pain.  You have long-lasting (chronic) lung disease and any of the following:  Wheezing.  Long-lasting cough.  Coughing up blood.  A change in your usual mucus.  You have a stiff neck.  You have changes in your:  Vision.  Hearing.  Thinking.  Mood. This information is not intended to replace advice given to you by your health care provider. Make sure you discuss any questions you have with your health care provider. Document Released: 12/29/2007 Document Revised: 03/14/2016 Document Reviewed: 10/17/2013 Elsevier Interactive Patient Education  2017 [place], PA-C  [facility]

## 2016-07-21 NOTE — Patient Instructions (Signed)
Upper Respiratory Infection, Adult Most upper respiratory infections (URIs) are caused by a virus. A URI affects the nose, throat, and upper air passages. The most common type of URI is often called "the common cold." Follow these instructions at home:  Take medicines only as told by your doctor.  Gargle warm saltwater or take cough drops to comfort your throat as told by your doctor.  Use a warm mist humidifier or inhale steam from a shower to increase air moisture. This may make it easier to breathe.  Drink enough fluid to keep your pee (urine) clear or pale yellow.  Eat soups and other clear broths.  Have a healthy diet.  Rest as needed.  Go back to work when your fever is gone or your doctor says it is okay.  You may need to stay home longer to avoid giving your URI to others.  You can also wear a face mask and wash your hands often to prevent spread of the virus.  Use your inhaler more if you have asthma.  Do not use any tobacco products, including cigarettes, chewing tobacco, or electronic cigarettes. If you need help quitting, ask your doctor. Contact a doctor if:  You are getting worse, not better.  Your symptoms are not helped by medicine.  You have chills.  You are getting more short of breath.  You have brown or red mucus.  You have yellow or brown discharge from your nose.  You have pain in your face, especially when you bend forward.  You have a fever.  You have puffy (swollen) neck glands.  You have pain while swallowing.  You have white areas in the back of your throat. Get help right away if:  You have very bad or constant:  Headache.  Ear pain.  Pain in your forehead, behind your eyes, and over your cheekbones (sinus pain).  Chest pain.  You have long-lasting (chronic) lung disease and any of the following:  Wheezing.  Long-lasting cough.  Coughing up blood.  A change in your usual mucus.  You have a stiff neck.  You have  changes in your:  Vision.  Hearing.  Thinking.  Mood. This information is not intended to replace advice given to you by your health care provider. Make sure you discuss any questions you have with your health care provider. Document Released: 12/29/2007 Document Revised: 03/14/2016 Document Reviewed: 10/17/2013 Elsevier Interactive Patient Education  2017 Elsevier Inc.  

## 2016-07-21 NOTE — Telephone Encounter (Signed)
error 

## 2016-07-22 DIAGNOSIS — M25512 Pain in left shoulder: Secondary | ICD-10-CM | POA: Diagnosis not present

## 2016-07-27 DIAGNOSIS — Z87898 Personal history of other specified conditions: Secondary | ICD-10-CM | POA: Diagnosis not present

## 2016-07-27 DIAGNOSIS — Z8371 Family history of colonic polyps: Secondary | ICD-10-CM | POA: Diagnosis not present

## 2016-07-27 DIAGNOSIS — K219 Gastro-esophageal reflux disease without esophagitis: Secondary | ICD-10-CM | POA: Diagnosis not present

## 2016-07-29 DIAGNOSIS — M25512 Pain in left shoulder: Secondary | ICD-10-CM | POA: Diagnosis not present

## 2016-08-05 DIAGNOSIS — B009 Herpesviral infection, unspecified: Secondary | ICD-10-CM | POA: Diagnosis not present

## 2016-08-05 DIAGNOSIS — L820 Inflamed seborrheic keratosis: Secondary | ICD-10-CM | POA: Diagnosis not present

## 2016-08-05 DIAGNOSIS — L718 Other rosacea: Secondary | ICD-10-CM | POA: Diagnosis not present

## 2016-08-05 DIAGNOSIS — D485 Neoplasm of uncertain behavior of skin: Secondary | ICD-10-CM | POA: Diagnosis not present

## 2016-08-05 DIAGNOSIS — Z85828 Personal history of other malignant neoplasm of skin: Secondary | ICD-10-CM | POA: Diagnosis not present

## 2016-08-05 DIAGNOSIS — Z1283 Encounter for screening for malignant neoplasm of skin: Secondary | ICD-10-CM | POA: Diagnosis not present

## 2016-08-05 DIAGNOSIS — L570 Actinic keratosis: Secondary | ICD-10-CM | POA: Diagnosis not present

## 2016-08-05 DIAGNOSIS — C44612 Basal cell carcinoma of skin of right upper limb, including shoulder: Secondary | ICD-10-CM | POA: Diagnosis not present

## 2016-08-05 DIAGNOSIS — C4491 Basal cell carcinoma of skin, unspecified: Secondary | ICD-10-CM | POA: Insufficient documentation

## 2016-08-05 DIAGNOSIS — Z808 Family history of malignant neoplasm of other organs or systems: Secondary | ICD-10-CM | POA: Diagnosis not present

## 2016-08-05 DIAGNOSIS — M25512 Pain in left shoulder: Secondary | ICD-10-CM | POA: Diagnosis not present

## 2016-08-10 DIAGNOSIS — M5033 Other cervical disc degeneration, cervicothoracic region: Secondary | ICD-10-CM | POA: Diagnosis not present

## 2016-08-10 DIAGNOSIS — M9903 Segmental and somatic dysfunction of lumbar region: Secondary | ICD-10-CM | POA: Diagnosis not present

## 2016-08-10 DIAGNOSIS — M5416 Radiculopathy, lumbar region: Secondary | ICD-10-CM | POA: Diagnosis not present

## 2016-08-10 DIAGNOSIS — M9901 Segmental and somatic dysfunction of cervical region: Secondary | ICD-10-CM | POA: Diagnosis not present

## 2016-08-12 DIAGNOSIS — M25512 Pain in left shoulder: Secondary | ICD-10-CM | POA: Diagnosis not present

## 2016-08-18 DIAGNOSIS — H26491 Other secondary cataract, right eye: Secondary | ICD-10-CM | POA: Diagnosis not present

## 2016-08-19 DIAGNOSIS — M25512 Pain in left shoulder: Secondary | ICD-10-CM | POA: Diagnosis not present

## 2016-08-23 DIAGNOSIS — C61 Malignant neoplasm of prostate: Secondary | ICD-10-CM | POA: Diagnosis not present

## 2016-08-26 DIAGNOSIS — M25512 Pain in left shoulder: Secondary | ICD-10-CM | POA: Diagnosis not present

## 2016-08-27 DIAGNOSIS — Z8546 Personal history of malignant neoplasm of prostate: Secondary | ICD-10-CM | POA: Diagnosis not present

## 2016-08-27 DIAGNOSIS — N5201 Erectile dysfunction due to arterial insufficiency: Secondary | ICD-10-CM | POA: Diagnosis not present

## 2016-09-01 DIAGNOSIS — M25512 Pain in left shoulder: Secondary | ICD-10-CM | POA: Diagnosis not present

## 2016-09-07 DIAGNOSIS — M9903 Segmental and somatic dysfunction of lumbar region: Secondary | ICD-10-CM | POA: Diagnosis not present

## 2016-09-07 DIAGNOSIS — M5033 Other cervical disc degeneration, cervicothoracic region: Secondary | ICD-10-CM | POA: Diagnosis not present

## 2016-09-07 DIAGNOSIS — C44612 Basal cell carcinoma of skin of right upper limb, including shoulder: Secondary | ICD-10-CM | POA: Diagnosis not present

## 2016-09-07 DIAGNOSIS — L570 Actinic keratosis: Secondary | ICD-10-CM | POA: Diagnosis not present

## 2016-09-07 DIAGNOSIS — M9901 Segmental and somatic dysfunction of cervical region: Secondary | ICD-10-CM | POA: Diagnosis not present

## 2016-09-07 DIAGNOSIS — M5416 Radiculopathy, lumbar region: Secondary | ICD-10-CM | POA: Diagnosis not present

## 2016-09-09 DIAGNOSIS — M25512 Pain in left shoulder: Secondary | ICD-10-CM | POA: Diagnosis not present

## 2016-09-14 DIAGNOSIS — C44612 Basal cell carcinoma of skin of right upper limb, including shoulder: Secondary | ICD-10-CM | POA: Diagnosis not present

## 2016-09-16 DIAGNOSIS — M25512 Pain in left shoulder: Secondary | ICD-10-CM | POA: Diagnosis not present

## 2016-09-22 DIAGNOSIS — M25512 Pain in left shoulder: Secondary | ICD-10-CM | POA: Diagnosis not present

## 2016-10-01 ENCOUNTER — Encounter: Payer: Self-pay | Admitting: *Deleted

## 2016-10-01 DIAGNOSIS — M25512 Pain in left shoulder: Secondary | ICD-10-CM | POA: Diagnosis not present

## 2016-10-04 ENCOUNTER — Ambulatory Visit: Payer: Medicare Other | Admitting: Anesthesiology

## 2016-10-04 ENCOUNTER — Encounter
Admission: RE | Disposition: A | Discharge status: Home / Self Care | Payer: Self-pay | Source: Ambulatory Visit | Attending: Unknown Physician Specialty

## 2016-10-04 ENCOUNTER — Encounter: Payer: Self-pay | Admitting: *Deleted

## 2016-10-04 ENCOUNTER — Ambulatory Visit
Admission: RE | Admit: 2016-10-04 | Discharge: 2016-10-04 | Disposition: A | Discharge status: Home / Self Care | Payer: Medicare Other | Source: Ambulatory Visit | Attending: Unknown Physician Specialty | Admitting: Unknown Physician Specialty

## 2016-10-04 DIAGNOSIS — D120 Benign neoplasm of cecum: Secondary | ICD-10-CM | POA: Diagnosis not present

## 2016-10-04 DIAGNOSIS — M1990 Unspecified osteoarthritis, unspecified site: Secondary | ICD-10-CM | POA: Insufficient documentation

## 2016-10-04 DIAGNOSIS — Z8546 Personal history of malignant neoplasm of prostate: Secondary | ICD-10-CM | POA: Diagnosis not present

## 2016-10-04 DIAGNOSIS — Z86010 Personal history of colonic polyps: Secondary | ICD-10-CM | POA: Diagnosis not present

## 2016-10-04 DIAGNOSIS — Z8371 Family history of colonic polyps: Secondary | ICD-10-CM | POA: Insufficient documentation

## 2016-10-04 DIAGNOSIS — K2960 Other gastritis without bleeding: Secondary | ICD-10-CM | POA: Diagnosis not present

## 2016-10-04 DIAGNOSIS — K219 Gastro-esophageal reflux disease without esophagitis: Secondary | ICD-10-CM | POA: Insufficient documentation

## 2016-10-04 DIAGNOSIS — K5730 Diverticulosis of large intestine without perforation or abscess without bleeding: Secondary | ICD-10-CM | POA: Diagnosis not present

## 2016-10-04 DIAGNOSIS — I10 Essential (primary) hypertension: Secondary | ICD-10-CM | POA: Insufficient documentation

## 2016-10-04 DIAGNOSIS — K2950 Unspecified chronic gastritis without bleeding: Secondary | ICD-10-CM | POA: Insufficient documentation

## 2016-10-04 DIAGNOSIS — K449 Diaphragmatic hernia without obstruction or gangrene: Secondary | ICD-10-CM | POA: Diagnosis not present

## 2016-10-04 DIAGNOSIS — K2900 Acute gastritis without bleeding: Secondary | ICD-10-CM | POA: Diagnosis not present

## 2016-10-04 DIAGNOSIS — Z87891 Personal history of nicotine dependence: Secondary | ICD-10-CM | POA: Insufficient documentation

## 2016-10-04 DIAGNOSIS — Z1211 Encounter for screening for malignant neoplasm of colon: Secondary | ICD-10-CM | POA: Diagnosis not present

## 2016-10-04 DIAGNOSIS — K640 First degree hemorrhoids: Secondary | ICD-10-CM | POA: Diagnosis not present

## 2016-10-04 DIAGNOSIS — Z7982 Long term (current) use of aspirin: Secondary | ICD-10-CM | POA: Diagnosis not present

## 2016-10-04 DIAGNOSIS — K2970 Gastritis, unspecified, without bleeding: Secondary | ICD-10-CM | POA: Diagnosis not present

## 2016-10-04 DIAGNOSIS — K5790 Diverticulosis of intestine, part unspecified, without perforation or abscess without bleeding: Secondary | ICD-10-CM | POA: Diagnosis not present

## 2016-10-04 DIAGNOSIS — K635 Polyp of colon: Secondary | ICD-10-CM | POA: Diagnosis not present

## 2016-10-04 DIAGNOSIS — Z96641 Presence of right artificial hip joint: Secondary | ICD-10-CM | POA: Diagnosis not present

## 2016-10-04 DIAGNOSIS — D123 Benign neoplasm of transverse colon: Secondary | ICD-10-CM | POA: Diagnosis not present

## 2016-10-04 DIAGNOSIS — K648 Other hemorrhoids: Secondary | ICD-10-CM | POA: Insufficient documentation

## 2016-10-04 HISTORY — PX: ESOPHAGOGASTRODUODENOSCOPY (EGD) WITH PROPOFOL: SHX5813

## 2016-10-04 HISTORY — DX: Unspecified open-angle glaucoma, stage unspecified: H40.10X0

## 2016-10-04 HISTORY — PX: COLONOSCOPY WITH PROPOFOL: SHX5780

## 2016-10-04 LAB — SURGICAL PATHOLOGY

## 2016-10-04 SURGERY — COLONOSCOPY WITH PROPOFOL
Anesthesia: General

## 2016-10-04 MED ORDER — PIPERACILLIN-TAZOBACTAM 3.375 G IVPB 30 MIN
3.3750 g | Freq: Once | INTRAVENOUS | Status: AC
  Administered 2016-10-04: 3.375 g via INTRAVENOUS

## 2016-10-04 MED ORDER — SODIUM CHLORIDE 0.9 % IV SOLN
INTRAVENOUS | Status: DC
  Administered 2016-10-04 (×2): via INTRAVENOUS

## 2016-10-04 MED ORDER — FENTANYL CITRATE (PF) 100 MCG/2ML IJ SOLN: 25.0000 ug | INTRAMUSCULAR | Status: DC | PRN

## 2016-10-04 MED ORDER — PROPOFOL 500 MG/50ML IV EMUL
INTRAVENOUS | Status: AC
  Filled 2016-10-04: qty 50

## 2016-10-04 MED ORDER — PROPOFOL 10 MG/ML IV BOLUS
INTRAVENOUS | Status: AC
  Filled 2016-10-04: qty 20

## 2016-10-04 MED ORDER — SODIUM CHLORIDE 0.9 % IV SOLN: INTRAVENOUS | Status: DC

## 2016-10-04 MED ORDER — PROPOFOL 500 MG/50ML IV EMUL
INTRAVENOUS | Status: DC | PRN
  Administered 2016-10-04: 150 ug/kg/min via INTRAVENOUS

## 2016-10-04 MED ORDER — ONDANSETRON HCL 4 MG/2ML IJ SOLN: 4.0000 mg | Freq: Once | INTRAMUSCULAR | Status: DC | PRN

## 2016-10-04 MED ORDER — PROPOFOL 10 MG/ML IV BOLUS
INTRAVENOUS | Status: DC | PRN
  Administered 2016-10-04: 20 mg via INTRAVENOUS
  Administered 2016-10-04: 70 mg via INTRAVENOUS

## 2016-10-04 NOTE — Anesthesia Post-op Follow-up Note (Cosign Needed)
Anesthesia QCDR form completed.        

## 2016-10-04 NOTE — H&P (Signed)
Primary Care Physician:  [name], MD Primary Gastroenterologist:  Dr. Vira Agar  Pre-Procedure History & Physical: HPI:  Dennis Steele is a 66 y.o. male is here for an endoscopy and colonoscopy.   Past Medical History:  Diagnosis Date  . Cancer [facility])    prostate  . GERD (gastroesophageal reflux disease)   . Hypertension   . OAG (open angle glaucoma)     Past Surgical History:  Procedure Laterality Date  . ANTERIOR CRUCIATE LIGAMENT REPAIR  25 yrs ago   not sure which side  . EYE SURGERY  04/2015   lens surgery from a dislodged les from cataract surgery  . LYMPHADENECTOMY  06/19/2012   Procedure: LYMPHADENECTOMY;  Surgeon: Dutch Gray, MD;  Location: [facility];  Service: Urology;  Laterality: Bilateral;  . ROBOT ASSISTED LAPAROSCOPIC RADICAL PROSTATECTOMY  06/19/2012   Procedure: ROBOTIC ASSISTED LAPAROSCOPIC RADICAL PROSTATECTOMY LEVEL 2;  Surgeon: Dutch Gray, MD;  Location: [facility];  Service: Urology;  Laterality: N/A;  . TOTAL HIP ARTHROPLASTY Right 2015    Prior to Admission medications   Medication Sig Start Date End Date Taking? Authorizing Provider  aspirin 81 MG tablet Take 1 tablet (81 mg total) by mouth daily. 06/11/15  Yes Richard Maceo Pro., MD  calcium carbonate (TUMS - DOSED IN MG ELEMENTAL CALCIUM) 500 MG chewable tablet Chew 1 tablet by mouth daily.   Yes Historical Provider, MD  lisinopril (PRINIVIL,ZESTRIL) 20 MG tablet TAKE ONE TABLET BY MOUTH EVERY DAY 06/11/16  Yes Jerrol Banana., MD  MULTIPLE VITAMIN PO Take by mouth. 11/03/10  Yes Historical Provider, MD  tadalafil (CIALIS) 5 MG tablet Take by mouth.   Yes Historical Provider, MD  timolol (TIMOPTIC) 0.25 % ophthalmic solution Apply to eye.   Yes Historical Provider, MD  Travoprost, BAK Free, (TRAVATAN Z) 0.004 % SOLN ophthalmic solution Apply to eye. 11/03/10  Yes Historical Provider, MD  doxycycline (VIBRA-TABS) 100 MG tablet Take 1 tablet (100 mg total) by mouth 2 (two) times daily.  07/21/16   Trinna Post, PA-C  triamcinolone cream (KENALOG) 0.1 % Apply 1 application topically 3 (three) times daily. To insect bites 02/16/16   Carmon Ginsberg, PA  valACYclovir (VALTREX) 1000 MG tablet Take 1,000 mg by mouth as needed. Only for outbreaks of fever blisters    Historical Provider, MD    Allergies as of 08/30/2016  . (No Known Allergies)    Family History  Problem Relation Age of Onset  . Diabetes Mother   . Cancer Mother     breast  . Stroke Mother   . Heart disease Mother   . Cancer Father     stomach, prostate- pt not complete  . Cancer Brother     esophagus    Social History   Social History  . Marital status: Married    Spouse name: N/A  . Number of children: N/A  . Years of education: N/A   Occupational History  . Not on file.   Social History Main Topics  . Smoking status: Former Smoker    Types: Cigars  . Smokeless tobacco: Never Used     Comment: quit cig 2 yrs ago, social smoking only  . Alcohol use Yes     Comment: occasional 3 x week  . Drug use: No  . Sexual activity: Yes   Other Topics Concern  . Not on file   Social History Narrative  . No narrative on file    Review of  Systems: See HPI, otherwise negative ROS  Physical Exam: BP 135/82   Pulse 78   Temp 98.5 F (36.9 C) (Temporal)   Resp 18   Ht 5\' 11"  (1.803 m)   Wt 89.4 kg (197 lb)   SpO2 100%   BMI 27.48 kg/m  General:   Alert,  pleasant and cooperative in NAD Head:  Normocephalic and atraumatic. Neck:  Supple; no masses or thyromegaly. Lungs:  Clear throughout to auscultation.    Heart:  Regular rate and rhythm. Abdomen:  Soft, nontender and nondistended. Normal bowel sounds, without guarding, and without rebound.   Neurologic:  Alert and  oriented x4;  grossly normal neurologically.  Impression/Plan: Dennis Steele is here for an endoscopy and colonoscopy to be performed for heartburn, FH colon polyps.  Risks, benefits, limitations, and alternatives  regarding  endoscopy and colonoscopy have been reviewed with the patient.  Questions have been answered.  All parties agreeable.   Gaylyn Cheers, MD  10/04/2016, 3:58 PM

## 2016-10-04 NOTE — Op Note (Signed)
[facility] Gastroenterology Patient Name: Dennis Steele Procedure Date: 10/04/2016 3:58 PM MRN: [id number] Account #: [id number] Date of Birth: [birth year] Admit Type: Outpatient Age: 66 Room: [facility] ENDO ROOM 3 Gender: Male Note Status: Finalized Procedure:            Upper GI endoscopy Indications:          Heartburn Providers:            Manya Silvas, MD Referring MD:         Janine Ores. Rosanna Randy, MD (Referring MD) Medicines:            Propofol per Anesthesia Complications:        No immediate complications. Procedure:            Pre-Anesthesia Assessment:                       - After reviewing the risks and benefits, the patient                        was deemed in satisfactory condition to undergo the                        procedure.                       After obtaining informed consent, the endoscope was                        passed under direct vision. Throughout the procedure,                        the patient's blood pressure, pulse, and oxygen                        saturations were monitored continuously. The Endoscope                        was introduced through the mouth, and advanced to the                        second part of duodenum. The upper GI endoscopy was                        accomplished without difficulty. The patient tolerated                        the procedure well. Findings:      The examined esophagus was normal. Gastroesophageal junction at 40cm.      A medium-sized hiatal hernia was present. Biopsies were taken with a       cold forceps for histology where the stomach and esophagus join each       other.      Patchy mild inflammation characterized by erythema and granularity was       found at the gastroesophageal junction, on the greater curvature of the       stomach and in the gastric antrum. Biopsies were taken with a cold       forceps for Helicobacter pylori testing. Biopsies were taken with a cold       forceps  for histology.      The examined duodenum was normal.  Impression:           - Normal esophagus.                       - Medium-sized hiatal hernia. Biopsied.                       - Gastritis. Biopsied.                       - Normal examined duodenum. Recommendation:       - Await pathology results. Manya Silvas, MD 10/04/2016 4:15:27 PM This report has been signed electronically. Number of Addenda: 0 Note Initiated On: 10/04/2016 3:58 PM      [facility]

## 2016-10-04 NOTE — Anesthesia Preprocedure Evaluation (Signed)
Anesthesia Evaluation  Patient identified by MRN, date of birth, ID band Patient awake    Reviewed: Allergy & Precautions, H&P , NPO status , Patient's Chart, lab work & pertinent test results  Airway Mallampati: III  TM Distance: <3 FB Neck ROM: Full  Mouth opening: Limited Mouth Opening  Dental no notable dental hx.    Pulmonary former smoker,    Pulmonary exam normal breath sounds clear to auscultation       Cardiovascular hypertension, Pt. on medications Normal cardiovascular exam Rhythm:Regular Rate:Normal     Neuro/Psych negative neurological ROS  negative psych ROS   GI/Hepatic negative GI ROS, Neg liver ROS, GERD  Medicated,  Endo/Other  negative endocrine ROS  Renal/GU negative Renal ROS  negative genitourinary   Musculoskeletal  (+) Arthritis , Osteoarthritis,    Abdominal   Peds negative pediatric ROS (+)  Hematology negative hematology ROS (+)   Anesthesia Other Findings Past Medical History: No date: Cancer ([place])     Comment: prostate No date: GERD (gastroesophageal reflux disease) No date: Hypertension No date: OAG (open angle glaucoma)  Reproductive/Obstetrics negative OB ROS                             Anesthesia Physical  Anesthesia Plan  ASA: II  Anesthesia Plan: General   Post-op Pain Management:    Induction: Intravenous  Airway Management Planned: Nasal Cannula  Additional Equipment:   Intra-op Plan:   Post-operative Plan:   Informed Consent: I have reviewed the patients History and Physical, chart, labs and discussed the procedure including the risks, benefits and alternatives for the proposed anesthesia with the patient or authorized representative who has indicated his/her understanding and acceptance.   Dental advisory given  Plan Discussed with: CRNA and Surgeon  Anesthesia Plan Comments:         Anesthesia Quick Evaluation

## 2016-10-04 NOTE — Op Note (Signed)
[facility] Gastroenterology Patient Name: Dennis Steele Procedure Date: 10/04/2016 3:57 PM MRN: [id number] Account #: [id number] Date of Birth: [birth year] Admit Type: Outpatient Age: 66 Room: [facility] ENDO ROOM 3 Gender: Male Note Status: Finalized Procedure:            Colonoscopy Indications:          Colon cancer screening in patient at increased risk:                        Family history of 1st-degree relative with colon polyps Providers:            Manya Silvas, MD Referring MD:         Janine Ores. Rosanna Randy, MD (Referring MD) Medicines:            Propofol per Anesthesia Complications:        No immediate complications. Procedure:            Pre-Anesthesia Assessment:                       - After reviewing the risks and benefits, the patient                        was deemed in satisfactory condition to undergo the                        procedure.                       After obtaining informed consent, the colonoscope was                        passed under direct vision. Throughout the procedure,                        the patient's blood pressure, pulse, and oxygen                        saturations were monitored continuously. The                        Colonoscope was introduced through the anus and                        advanced to the the cecum, identified by appendiceal                        orifice and ileocecal valve. The colonoscopy was                        performed without difficulty. The patient tolerated the                        procedure well. The quality of the bowel preparation                        was good. Findings:      Two sessile polyps were found in the cecum. The polyps were small in       size. These polyps were removed with a cold snare. Resection and       retrieval were complete.  A 10 mm polyp was found in the hepatic flexure. The polyp was sessile.       The polyp was removed with a hot snare. Resection and  retrieval were       complete. To prevent bleeding after the polypectomy, one hemostatic clip       was successfully placed. There was no bleeding during, or at the end, of       the procedure.      Multiple small-mouthed diverticula were found in the sigmoid colon and       descending colon.      Internal hemorrhoids were found during endoscopy. The hemorrhoids were       small and Grade I (internal hemorrhoids that do not prolapse). Impression:           - Two small polyps in the cecum, removed with a cold                        snare. Resected and retrieved.                       - One 10 mm polyp at the hepatic flexure, removed with                        a hot snare. Resected and retrieved. Clip was placed.                       - Diverticulosis in the sigmoid colon and in the                        descending colon.                       - Internal hemorrhoids. Recommendation:       - Await pathology results. Manya Silvas, MD 10/04/2016 5:00:12 PM This report has been signed electronically. Number of Addenda: 0 Note Initiated On: 10/04/2016 3:57 PM Scope Withdrawal Time: 0 hours 24 minutes 16 seconds  Total Procedure Duration: 0 hours 39 minutes 18 seconds       [facility]

## 2016-10-04 NOTE — Anesthesia Postprocedure Evaluation (Signed)
Anesthesia Post Note  Patient: Dennis Steele  Procedure(s) Performed: Procedure(s) (LRB): COLONOSCOPY WITH PROPOFOL (N/A) ESOPHAGOGASTRODUODENOSCOPY (EGD) WITH PROPOFOL (N/A)  Patient location during evaluation: Endoscopy Anesthesia Type: General Level of consciousness: awake and alert Pain management: pain level controlled Vital Signs Assessment: post-procedure vital signs reviewed and stable Respiratory status: spontaneous breathing and respiratory function stable Cardiovascular status: stable Anesthetic complications: no     Last Vitals:  Vitals:   10/04/16 1724 10/04/16 1734  BP: (!) 163/98 (!) 163/101  Pulse:    Resp:    Temp:      Last Pain:  Vitals:   10/04/16 1654  TempSrc: Tympanic                 KEPHART,WILLIAM K

## 2016-10-04 NOTE — Transfer of Care (Signed)
Immediate Anesthesia Transfer of Care Note  Patient: Dennis Steele  Procedure(s) Performed: Procedure(s): COLONOSCOPY WITH PROPOFOL (N/A) ESOPHAGOGASTRODUODENOSCOPY (EGD) WITH PROPOFOL (N/A)  Patient Location: PACU  Anesthesia Type:General  Level of Consciousness: awake, alert  and oriented  Airway & Oxygen Therapy: Patient Spontanous Breathing and Patient connected to face mask oxygen  Post-op Assessment: Report given to RN and Post -op Vital signs reviewed and stable  Post vital signs: Reviewed and stable  Last Vitals:  Vitals:   10/04/16 1314  BP: 135/82  Pulse: 78  Resp: 18  Temp: 36.9 C    Last Pain:  Vitals:   10/04/16 1314  TempSrc: Temporal         Complications: No apparent anesthesia complications

## 2016-10-04 NOTE — Anesthesia Procedure Notes (Signed)
Date/Time: 10/04/2016 4:08 PM Performed by: Nelda Marseille Pre-anesthesia Checklist: Patient identified, Emergency Drugs available, Suction available, Patient being monitored and Timeout performed Oxygen Delivery Method: Nasal cannula

## 2016-10-05 DIAGNOSIS — M5033 Other cervical disc degeneration, cervicothoracic region: Secondary | ICD-10-CM | POA: Diagnosis not present

## 2016-10-05 DIAGNOSIS — M5416 Radiculopathy, lumbar region: Secondary | ICD-10-CM | POA: Diagnosis not present

## 2016-10-05 DIAGNOSIS — M9903 Segmental and somatic dysfunction of lumbar region: Secondary | ICD-10-CM | POA: Diagnosis not present

## 2016-10-05 DIAGNOSIS — M9901 Segmental and somatic dysfunction of cervical region: Secondary | ICD-10-CM | POA: Diagnosis not present

## 2016-10-07 DIAGNOSIS — M25512 Pain in left shoulder: Secondary | ICD-10-CM | POA: Diagnosis not present

## 2016-10-13 DIAGNOSIS — M25512 Pain in left shoulder: Secondary | ICD-10-CM | POA: Diagnosis not present

## 2016-10-20 DIAGNOSIS — M75122 Complete rotator cuff tear or rupture of left shoulder, not specified as traumatic: Secondary | ICD-10-CM | POA: Diagnosis not present

## 2016-10-20 DIAGNOSIS — M25512 Pain in left shoulder: Secondary | ICD-10-CM | POA: Diagnosis not present

## 2016-10-27 DIAGNOSIS — M25512 Pain in left shoulder: Secondary | ICD-10-CM | POA: Diagnosis not present

## 2016-11-03 DIAGNOSIS — M47817 Spondylosis without myelopathy or radiculopathy, lumbosacral region: Secondary | ICD-10-CM | POA: Diagnosis not present

## 2016-11-04 DIAGNOSIS — M25512 Pain in left shoulder: Secondary | ICD-10-CM | POA: Diagnosis not present

## 2016-11-11 DIAGNOSIS — M9903 Segmental and somatic dysfunction of lumbar region: Secondary | ICD-10-CM | POA: Diagnosis not present

## 2016-11-11 DIAGNOSIS — M5033 Other cervical disc degeneration, cervicothoracic region: Secondary | ICD-10-CM | POA: Diagnosis not present

## 2016-11-11 DIAGNOSIS — M5416 Radiculopathy, lumbar region: Secondary | ICD-10-CM | POA: Diagnosis not present

## 2016-11-11 DIAGNOSIS — M9901 Segmental and somatic dysfunction of cervical region: Secondary | ICD-10-CM | POA: Diagnosis not present

## 2016-11-17 DIAGNOSIS — M25512 Pain in left shoulder: Secondary | ICD-10-CM | POA: Diagnosis not present

## 2016-11-25 DIAGNOSIS — M25512 Pain in left shoulder: Secondary | ICD-10-CM | POA: Diagnosis not present

## 2016-12-01 DIAGNOSIS — M25512 Pain in left shoulder: Secondary | ICD-10-CM | POA: Diagnosis not present

## 2016-12-07 DIAGNOSIS — M9901 Segmental and somatic dysfunction of cervical region: Secondary | ICD-10-CM | POA: Diagnosis not present

## 2016-12-07 DIAGNOSIS — M5033 Other cervical disc degeneration, cervicothoracic region: Secondary | ICD-10-CM | POA: Diagnosis not present

## 2016-12-07 DIAGNOSIS — M5416 Radiculopathy, lumbar region: Secondary | ICD-10-CM | POA: Diagnosis not present

## 2016-12-07 DIAGNOSIS — M9903 Segmental and somatic dysfunction of lumbar region: Secondary | ICD-10-CM | POA: Diagnosis not present

## 2016-12-21 DIAGNOSIS — H903 Sensorineural hearing loss, bilateral: Secondary | ICD-10-CM | POA: Diagnosis not present

## 2016-12-21 DIAGNOSIS — T162XXA Foreign body in left ear, initial encounter: Secondary | ICD-10-CM | POA: Diagnosis not present

## 2016-12-23 DIAGNOSIS — M25512 Pain in left shoulder: Secondary | ICD-10-CM | POA: Diagnosis not present

## 2016-12-30 DIAGNOSIS — M25512 Pain in left shoulder: Secondary | ICD-10-CM | POA: Diagnosis not present

## 2017-01-04 DIAGNOSIS — M9903 Segmental and somatic dysfunction of lumbar region: Secondary | ICD-10-CM | POA: Diagnosis not present

## 2017-01-04 DIAGNOSIS — M9901 Segmental and somatic dysfunction of cervical region: Secondary | ICD-10-CM | POA: Diagnosis not present

## 2017-01-04 DIAGNOSIS — M5033 Other cervical disc degeneration, cervicothoracic region: Secondary | ICD-10-CM | POA: Diagnosis not present

## 2017-01-04 DIAGNOSIS — M5416 Radiculopathy, lumbar region: Secondary | ICD-10-CM | POA: Diagnosis not present

## 2017-01-13 DIAGNOSIS — M25512 Pain in left shoulder: Secondary | ICD-10-CM | POA: Diagnosis not present

## 2017-01-19 DIAGNOSIS — M47817 Spondylosis without myelopathy or radiculopathy, lumbosacral region: Secondary | ICD-10-CM | POA: Diagnosis not present

## 2017-01-19 DIAGNOSIS — M25551 Pain in right hip: Secondary | ICD-10-CM | POA: Diagnosis not present

## 2017-01-19 DIAGNOSIS — M7138 Other bursal cyst, other site: Secondary | ICD-10-CM | POA: Diagnosis not present

## 2017-01-19 DIAGNOSIS — M1991 Primary osteoarthritis, unspecified site: Secondary | ICD-10-CM | POA: Diagnosis not present

## 2017-02-08 DIAGNOSIS — M5416 Radiculopathy, lumbar region: Secondary | ICD-10-CM | POA: Diagnosis not present

## 2017-02-08 DIAGNOSIS — M9901 Segmental and somatic dysfunction of cervical region: Secondary | ICD-10-CM | POA: Diagnosis not present

## 2017-02-08 DIAGNOSIS — M9903 Segmental and somatic dysfunction of lumbar region: Secondary | ICD-10-CM | POA: Diagnosis not present

## 2017-02-08 DIAGNOSIS — M5033 Other cervical disc degeneration, cervicothoracic region: Secondary | ICD-10-CM | POA: Diagnosis not present

## 2017-02-16 DIAGNOSIS — M25551 Pain in right hip: Secondary | ICD-10-CM | POA: Diagnosis not present

## 2017-02-25 ENCOUNTER — Telehealth: Payer: Self-pay

## 2017-02-25 NOTE — Telephone Encounter (Signed)
Lmtcb, we have pre op forms to fill out for patient for his upcoming surgery on 06/21/17 and he has appointment on 06/14/17. Wanted to see if he wants to move this up. Appointment on 06/14/17 is for a CPE after wellness visit and we need to make surgical clearance separate-aa

## 2017-02-25 NOTE — Telephone Encounter (Signed)
Spoke with patient. Patient states he will be going out of town for about 2 weeks and then will call surgeon office and see if they can move up the surgery date because he is in pain and can not wait till November. Patient will call us back to let us know about his new appointment and then will make pre op appointment for him. Will keep the form that was sent to us-aa

## 2017-02-28 ENCOUNTER — Other Ambulatory Visit: Payer: Self-pay | Admitting: Family Medicine

## 2017-02-28 DIAGNOSIS — I10 Essential (primary) hypertension: Secondary | ICD-10-CM

## 2017-03-15 DIAGNOSIS — M9903 Segmental and somatic dysfunction of lumbar region: Secondary | ICD-10-CM | POA: Diagnosis not present

## 2017-03-15 DIAGNOSIS — M5033 Other cervical disc degeneration, cervicothoracic region: Secondary | ICD-10-CM | POA: Diagnosis not present

## 2017-03-15 DIAGNOSIS — M9901 Segmental and somatic dysfunction of cervical region: Secondary | ICD-10-CM | POA: Diagnosis not present

## 2017-03-15 DIAGNOSIS — M5416 Radiculopathy, lumbar region: Secondary | ICD-10-CM | POA: Diagnosis not present

## 2017-03-22 ENCOUNTER — Encounter: Payer: Self-pay | Admitting: Family Medicine

## 2017-03-22 ENCOUNTER — Ambulatory Visit (INDEPENDENT_AMBULATORY_CARE_PROVIDER_SITE_OTHER): Payer: Medicare Other | Admitting: Family Medicine

## 2017-03-22 VITALS — BP 136/84 | HR 62 | Temp 97.6°F | Resp 16 | Wt 206.0 lb

## 2017-03-22 DIAGNOSIS — Z01818 Encounter for other preprocedural examination: Secondary | ICD-10-CM

## 2017-03-22 DIAGNOSIS — I10 Essential (primary) hypertension: Secondary | ICD-10-CM

## 2017-03-22 DIAGNOSIS — Z8639 Personal history of other endocrine, nutritional and metabolic disease: Secondary | ICD-10-CM | POA: Diagnosis not present

## 2017-03-22 LAB — POCT URINALYSIS DIPSTICK
Bilirubin, UA: NEGATIVE
Blood, UA: NEGATIVE
GLUCOSE UA: NEGATIVE
KETONES UA: NEGATIVE
Leukocytes, UA: NEGATIVE
Nitrite, UA: NEGATIVE
PROTEIN UA: NEGATIVE
SPEC GRAV UA: 1.01 (ref 1.010–1.025)
UROBILINOGEN UA: 0.2 U/dL
pH, UA: 7.5 (ref 5.0–8.0)

## 2017-03-22 NOTE — Progress Notes (Signed)
Patient: Dennis Steele Male    DOB: March 11, 1951   66 y.o.   MRN: [id number] Visit Date: 03/22/2017  Today's Provider: [name], MD   Chief Complaint  Patient presents with  . Medical Clearance   Subjective:    HPI Pt is here for a surgical clearance for a right total hip replacement. Dr. Curlene Dolphin with [facility] will be doing his hip replacement. He reports that he has been feeling well other than the hip pain. He says he is more than ready for the surgery to get some relief.  No complaints other than the hip pain.     No Known Allergies   Current Outpatient Prescriptions:  .  aspirin 81 MG tablet, Take 1 tablet (81 mg total) by mouth daily., Disp: 30 tablet, Rfl: 12 .  calcium carbonate (TUMS - DOSED IN MG ELEMENTAL CALCIUM) 500 MG chewable tablet, Chew 1 tablet by mouth daily., Disp: , Rfl:  .  lisinopril (PRINIVIL,ZESTRIL) 20 MG tablet, TAKE 1 TABLET BY MOUTH DAILY, Disp: 90 tablet, Rfl: 3 .  MULTIPLE VITAMIN PO, Take by mouth., Disp: , Rfl:  .  tadalafil (CIALIS) 5 MG tablet, Take by mouth., Disp: , Rfl:  .  timolol (TIMOPTIC) 0.25 % ophthalmic solution, Apply to eye., Disp: , Rfl:  .  Travoprost, BAK Free, (TRAVATAN Z) 0.004 % SOLN ophthalmic solution, Apply to eye., Disp: , Rfl:  .  triamcinolone cream (KENALOG) 0.1 %, Apply 1 application topically 3 (three) times daily. To insect bites, Disp: 30 g, Rfl: 0 .  valACYclovir (VALTREX) 1000 MG tablet, Take 1,000 mg by mouth as needed. Only for outbreaks of fever blisters, Disp: , Rfl:  .  doxycycline (VIBRA-TABS) 100 MG tablet, Take 1 tablet (100 mg total) by mouth 2 (two) times daily. (Patient not taking: Reported on 03/22/2017), Disp: 20 tablet, Rfl: 0  Review of Systems  Constitutional: Negative.   HENT: Negative.   Eyes: Negative.   Respiratory: Negative.   Cardiovascular: Negative.   Gastrointestinal: Negative.   Endocrine: Negative.   Genitourinary: Negative.   Musculoskeletal: Positive for  arthralgias.  Skin: Negative.   Allergic/Immunologic: Negative.   Neurological: Negative.   Hematological: Negative.   Psychiatric/Behavioral: Negative.     Social History  Substance Use Topics  . Smoking status: Former Smoker    Types: Cigars  . Smokeless tobacco: Never Used     Comment: quit cig 2 yrs ago, social smoking only  . Alcohol use Yes     Comment: occasional 3 x week   Objective:   BP 136/84 (BP Location: Left Arm, Patient Position: Sitting, Cuff Size: Normal)   Pulse 62   Temp 97.6 F (36.4 C) (Oral)   Resp 16   Wt 206 lb (93.4 kg)   BMI 28.73 kg/m  Vitals:   03/22/17 1131  BP: 136/84  Pulse: 62  Resp: 16  Temp: 97.6 F (36.4 C)  TempSrc: Oral  Weight: 206 lb (93.4 kg)     Physical Exam  Constitutional: He is oriented to person, place, and time. He appears well-developed and well-nourished.  HENT:  Head: Normocephalic and atraumatic.  Nose: Nose normal.  Mouth/Throat: Oropharynx is clear and moist.  Eyes: Pupils are equal, round, and reactive to light. Conjunctivae and EOM are normal.  Neck: Normal range of motion. Neck supple.  Cardiovascular: Normal rate, regular rhythm, normal heart sounds and intact distal pulses.   Pulmonary/Chest: Effort normal and breath sounds normal.  Abdominal:  Soft. Bowel sounds are normal.  Musculoskeletal: Normal range of motion.  Neurological: He is alert and oriented to person, place, and time. He has normal reflexes.  Skin: Skin is warm and dry.  Psychiatric: He has a normal mood and affect. His behavior is normal. Judgment and thought content normal.        Assessment & Plan:     1. Pre-operative clearance Patient cleared for total hip replacement. - EKG 12-Lead - CBC with Differential/Platelet - Hemoglobin A1c - POCT urinalysis dipstick - Albumin  2. HTN (hypertension), benign  - CBC with Differential/Platelet  3. History of hyperglycemia  - Hemoglobin A1c 4.OA of hip    HPI, Exam, and A&P  Transcribed under the direction and in the presence of Shunsuke Granzow L. Cranford Mon, MD  Electronically Signed: Katina Dung, CMA I have done the exam and reviewed the above chart and it is accurate to the best of my knowledge. Development worker, community has been used in this note in any air is in the dictation or transcription are unintentional.   [name], MD  [place]

## 2017-03-23 ENCOUNTER — Telehealth: Payer: Self-pay

## 2017-03-23 DIAGNOSIS — Z01812 Encounter for preprocedural laboratory examination: Secondary | ICD-10-CM | POA: Diagnosis not present

## 2017-03-23 DIAGNOSIS — Z01818 Encounter for other preprocedural examination: Secondary | ICD-10-CM | POA: Diagnosis not present

## 2017-03-23 DIAGNOSIS — M1611 Unilateral primary osteoarthritis, right hip: Secondary | ICD-10-CM | POA: Diagnosis not present

## 2017-03-23 LAB — COMPREHENSIVE METABOLIC PANEL
A/G RATIO: 1.4 (ref 1.2–2.2)
ALBUMIN: 4.1 g/dL (ref 3.6–4.8)
ALK PHOS: 73 IU/L (ref 39–117)
ALT: 13 IU/L (ref 0–44)
AST: 20 IU/L (ref 0–40)
BILIRUBIN TOTAL: 0.4 mg/dL (ref 0.0–1.2)
BUN / CREAT RATIO: 17 (ref 10–24)
BUN: 14 mg/dL (ref 8–27)
CHLORIDE: 102 mmol/L (ref 96–106)
CO2: 25 mmol/L (ref 20–29)
Calcium: 9.6 mg/dL (ref 8.6–10.2)
Creatinine, Ser: 0.83 mg/dL (ref 0.76–1.27)
GFR calc Af Amer: 105 mL/min/{1.73_m2} (ref >59)
GFR calc non Af Amer: 90 mL/min/{1.73_m2} (ref >59)
GLOBULIN, TOTAL: 2.9 g/dL (ref 1.5–4.5)
GLUCOSE: 84 mg/dL (ref 65–99)
POTASSIUM: 4.7 mmol/L (ref 3.5–5.2)
SODIUM: 139 mmol/L (ref 134–144)
TOTAL PROTEIN: 7 g/dL (ref 6.0–8.5)

## 2017-03-23 LAB — CBC WITH DIFFERENTIAL/PLATELET
BASOS ABS: 0 10*3/uL (ref 0.0–0.2)
BASOS: 1 %
EOS (ABSOLUTE): 0.1 10*3/uL (ref 0.0–0.4)
Eos: 1 %
Hematocrit: 42.6 % (ref 37.5–51.0)
Hemoglobin: 14.1 g/dL (ref 13.0–17.7)
IMMATURE GRANS (ABS): 0.1 10*3/uL (ref 0.0–0.1)
Immature Granulocytes: 1 %
LYMPHS ABS: 2 10*3/uL (ref 0.7–3.1)
LYMPHS: 29 %
MCH: 30.9 pg (ref 26.6–33.0)
MCHC: 33.1 g/dL (ref 31.5–35.7)
MCV: 93 fL (ref 79–97)
Monocytes Absolute: 0.6 10*3/uL (ref 0.1–0.9)
Monocytes: 9 %
NEUTROS ABS: 4.1 10*3/uL (ref 1.4–7.0)
Neutrophils: 59 %
PLATELETS: 257 10*3/uL (ref 150–379)
RBC: 4.56 x10E6/uL (ref 4.14–5.80)
RDW: 13.9 % (ref 12.3–15.4)
WBC: 6.9 10*3/uL (ref 3.4–10.8)

## 2017-03-23 LAB — HEMOGLOBIN A1C
Est. average glucose Bld gHb Est-mCnc: 114 mg/dL
HEMOGLOBIN A1C: 5.6 % (ref 4.8–5.6)

## 2017-03-23 LAB — ALBUMIN: Albumin: 4.1 g/dL (ref 3.6–4.8)

## 2017-03-23 NOTE — Telephone Encounter (Signed)
done

## 2017-03-23 NOTE — Telephone Encounter (Signed)
Noted-aa 

## 2017-03-23 NOTE — Telephone Encounter (Signed)
Dr Rosanna Randy can you please sign off on the office note so we can fax it to [facility] for surgery next week. Thank you-aa

## 2017-03-24 ENCOUNTER — Telehealth: Payer: Self-pay

## 2017-03-24 NOTE — Telephone Encounter (Signed)
Pt advised.   Thanks,   -Lyndsay Talamante  

## 2017-03-24 NOTE — Telephone Encounter (Signed)
-----   Message from Jerrol Banana., MD sent at 03/23/2017  1:42 PM EDT ----- ok

## 2017-03-29 DIAGNOSIS — D649 Anemia, unspecified: Secondary | ICD-10-CM | POA: Diagnosis not present

## 2017-03-29 DIAGNOSIS — Z9079 Acquired absence of other genital organ(s): Secondary | ICD-10-CM | POA: Diagnosis not present

## 2017-03-29 DIAGNOSIS — I10 Essential (primary) hypertension: Secondary | ICD-10-CM | POA: Diagnosis not present

## 2017-03-29 DIAGNOSIS — Z8546 Personal history of malignant neoplasm of prostate: Secondary | ICD-10-CM | POA: Diagnosis not present

## 2017-03-29 DIAGNOSIS — T84020A Dislocation of internal right hip prosthesis, initial encounter: Secondary | ICD-10-CM | POA: Diagnosis not present

## 2017-03-29 DIAGNOSIS — H409 Unspecified glaucoma: Secondary | ICD-10-CM | POA: Diagnosis not present

## 2017-03-29 DIAGNOSIS — D72829 Elevated white blood cell count, unspecified: Secondary | ICD-10-CM | POA: Diagnosis not present

## 2017-03-29 DIAGNOSIS — M25551 Pain in right hip: Secondary | ICD-10-CM | POA: Diagnosis not present

## 2017-03-29 DIAGNOSIS — M1611 Unilateral primary osteoarthritis, right hip: Secondary | ICD-10-CM | POA: Diagnosis not present

## 2017-03-31 DIAGNOSIS — T84020A Dislocation of internal right hip prosthesis, initial encounter: Secondary | ICD-10-CM | POA: Diagnosis not present

## 2017-03-31 DIAGNOSIS — M1611 Unilateral primary osteoarthritis, right hip: Secondary | ICD-10-CM | POA: Diagnosis not present

## 2017-04-05 DIAGNOSIS — R2689 Other abnormalities of gait and mobility: Secondary | ICD-10-CM | POA: Diagnosis not present

## 2017-04-05 DIAGNOSIS — M25551 Pain in right hip: Secondary | ICD-10-CM | POA: Diagnosis not present

## 2017-04-12 DIAGNOSIS — R2689 Other abnormalities of gait and mobility: Secondary | ICD-10-CM | POA: Diagnosis not present

## 2017-04-12 DIAGNOSIS — M25551 Pain in right hip: Secondary | ICD-10-CM | POA: Diagnosis not present

## 2017-04-14 DIAGNOSIS — M25551 Pain in right hip: Secondary | ICD-10-CM | POA: Diagnosis not present

## 2017-04-14 DIAGNOSIS — R2689 Other abnormalities of gait and mobility: Secondary | ICD-10-CM | POA: Diagnosis not present

## 2017-04-18 DIAGNOSIS — M25551 Pain in right hip: Secondary | ICD-10-CM | POA: Diagnosis not present

## 2017-04-18 DIAGNOSIS — R2689 Other abnormalities of gait and mobility: Secondary | ICD-10-CM | POA: Diagnosis not present

## 2017-04-20 DIAGNOSIS — R2689 Other abnormalities of gait and mobility: Secondary | ICD-10-CM | POA: Diagnosis not present

## 2017-04-20 DIAGNOSIS — M25551 Pain in right hip: Secondary | ICD-10-CM | POA: Diagnosis not present

## 2017-04-25 DIAGNOSIS — M25551 Pain in right hip: Secondary | ICD-10-CM | POA: Diagnosis not present

## 2017-04-25 DIAGNOSIS — M25651 Stiffness of right hip, not elsewhere classified: Secondary | ICD-10-CM | POA: Diagnosis not present

## 2017-04-27 DIAGNOSIS — M25651 Stiffness of right hip, not elsewhere classified: Secondary | ICD-10-CM | POA: Diagnosis not present

## 2017-04-27 DIAGNOSIS — M25551 Pain in right hip: Secondary | ICD-10-CM | POA: Diagnosis not present

## 2017-05-03 DIAGNOSIS — M25551 Pain in right hip: Secondary | ICD-10-CM | POA: Diagnosis not present

## 2017-05-03 DIAGNOSIS — R2689 Other abnormalities of gait and mobility: Secondary | ICD-10-CM | POA: Diagnosis not present

## 2017-05-03 DIAGNOSIS — M25651 Stiffness of right hip, not elsewhere classified: Secondary | ICD-10-CM | POA: Diagnosis not present

## 2017-05-04 DIAGNOSIS — Z96643 Presence of artificial hip joint, bilateral: Secondary | ICD-10-CM | POA: Diagnosis not present

## 2017-05-05 DIAGNOSIS — M25551 Pain in right hip: Secondary | ICD-10-CM | POA: Diagnosis not present

## 2017-05-05 DIAGNOSIS — M25651 Stiffness of right hip, not elsewhere classified: Secondary | ICD-10-CM | POA: Diagnosis not present

## 2017-05-09 DIAGNOSIS — M25651 Stiffness of right hip, not elsewhere classified: Secondary | ICD-10-CM | POA: Diagnosis not present

## 2017-05-09 DIAGNOSIS — M25551 Pain in right hip: Secondary | ICD-10-CM | POA: Diagnosis not present

## 2017-05-10 DIAGNOSIS — M9903 Segmental and somatic dysfunction of lumbar region: Secondary | ICD-10-CM | POA: Diagnosis not present

## 2017-05-10 DIAGNOSIS — M9901 Segmental and somatic dysfunction of cervical region: Secondary | ICD-10-CM | POA: Diagnosis not present

## 2017-05-10 DIAGNOSIS — M5416 Radiculopathy, lumbar region: Secondary | ICD-10-CM | POA: Diagnosis not present

## 2017-05-10 DIAGNOSIS — M5033 Other cervical disc degeneration, cervicothoracic region: Secondary | ICD-10-CM | POA: Diagnosis not present

## 2017-05-11 DIAGNOSIS — M25551 Pain in right hip: Secondary | ICD-10-CM | POA: Diagnosis not present

## 2017-05-11 DIAGNOSIS — M25651 Stiffness of right hip, not elsewhere classified: Secondary | ICD-10-CM | POA: Diagnosis not present

## 2017-05-16 DIAGNOSIS — M25551 Pain in right hip: Secondary | ICD-10-CM | POA: Diagnosis not present

## 2017-05-16 DIAGNOSIS — M25651 Stiffness of right hip, not elsewhere classified: Secondary | ICD-10-CM | POA: Diagnosis not present

## 2017-05-18 DIAGNOSIS — M25651 Stiffness of right hip, not elsewhere classified: Secondary | ICD-10-CM | POA: Diagnosis not present

## 2017-05-18 DIAGNOSIS — M25551 Pain in right hip: Secondary | ICD-10-CM | POA: Diagnosis not present

## 2017-05-23 DIAGNOSIS — M25651 Stiffness of right hip, not elsewhere classified: Secondary | ICD-10-CM | POA: Diagnosis not present

## 2017-05-23 DIAGNOSIS — M25551 Pain in right hip: Secondary | ICD-10-CM | POA: Diagnosis not present

## 2017-05-31 DIAGNOSIS — M25651 Stiffness of right hip, not elsewhere classified: Secondary | ICD-10-CM | POA: Diagnosis not present

## 2017-05-31 DIAGNOSIS — M25551 Pain in right hip: Secondary | ICD-10-CM | POA: Diagnosis not present

## 2017-06-06 ENCOUNTER — Telehealth: Payer: Self-pay | Admitting: Family Medicine

## 2017-06-06 DIAGNOSIS — H401431 Capsular glaucoma with pseudoexfoliation of lens, bilateral, mild stage: Secondary | ICD-10-CM | POA: Diagnosis not present

## 2017-06-06 DIAGNOSIS — M25651 Stiffness of right hip, not elsewhere classified: Secondary | ICD-10-CM | POA: Diagnosis not present

## 2017-06-06 DIAGNOSIS — M25551 Pain in right hip: Secondary | ICD-10-CM | POA: Diagnosis not present

## 2017-06-07 DIAGNOSIS — M5416 Radiculopathy, lumbar region: Secondary | ICD-10-CM | POA: Diagnosis not present

## 2017-06-07 DIAGNOSIS — M5033 Other cervical disc degeneration, cervicothoracic region: Secondary | ICD-10-CM | POA: Diagnosis not present

## 2017-06-07 DIAGNOSIS — M9903 Segmental and somatic dysfunction of lumbar region: Secondary | ICD-10-CM | POA: Diagnosis not present

## 2017-06-07 DIAGNOSIS — M9901 Segmental and somatic dysfunction of cervical region: Secondary | ICD-10-CM | POA: Diagnosis not present

## 2017-06-14 ENCOUNTER — Ambulatory Visit: Payer: Medicare Other

## 2017-06-14 ENCOUNTER — Ambulatory Visit: Payer: Medicare Other | Admitting: Family Medicine

## 2017-06-15 DIAGNOSIS — Z96641 Presence of right artificial hip joint: Secondary | ICD-10-CM | POA: Diagnosis not present

## 2017-06-15 DIAGNOSIS — M47817 Spondylosis without myelopathy or radiculopathy, lumbosacral region: Secondary | ICD-10-CM | POA: Diagnosis not present

## 2017-06-30 ENCOUNTER — Ambulatory Visit (INDEPENDENT_AMBULATORY_CARE_PROVIDER_SITE_OTHER): Payer: Medicare Other | Admitting: Family Medicine

## 2017-06-30 ENCOUNTER — Encounter: Payer: Self-pay | Admitting: Family Medicine

## 2017-06-30 VITALS — BP 124/72 | HR 72 | Temp 98.1°F | Resp 16 | Ht 71.0 in | Wt 202.0 lb

## 2017-06-30 DIAGNOSIS — I10 Essential (primary) hypertension: Secondary | ICD-10-CM | POA: Diagnosis not present

## 2017-06-30 DIAGNOSIS — Z0001 Encounter for general adult medical examination with abnormal findings: Secondary | ICD-10-CM

## 2017-06-30 DIAGNOSIS — E7800 Pure hypercholesterolemia, unspecified: Secondary | ICD-10-CM | POA: Diagnosis not present

## 2017-06-30 DIAGNOSIS — Z0000 Encounter for general adult medical examination without abnormal findings: Secondary | ICD-10-CM

## 2017-06-30 DIAGNOSIS — M159 Polyosteoarthritis, unspecified: Secondary | ICD-10-CM

## 2017-06-30 DIAGNOSIS — Z23 Encounter for immunization: Secondary | ICD-10-CM | POA: Diagnosis not present

## 2017-06-30 DIAGNOSIS — N400 Enlarged prostate without lower urinary tract symptoms: Secondary | ICD-10-CM | POA: Diagnosis not present

## 2017-06-30 NOTE — Progress Notes (Signed)
Patient: Dennis Steele, Male    DOB: [birth year], 66 y.o.   MRN: [id number] Visit Date: 06/30/2017  Today's Provider: [name], MD   Chief Complaint  Patient presents with  . Medicare Wellness   Subjective:    Annual wellness visit Dennis Steele is a 66 y.o. male. He feels well. He reports exercising, now that he has been released from his hip replacement. He reports he is sleeping well.  -----------------------------------------------------------   Review of Systems  Constitutional: Negative.   HENT: Positive for tinnitus.   Eyes: Negative.   Respiratory: Negative.   Cardiovascular: Negative.   Gastrointestinal: Negative.   Endocrine: Negative.   Genitourinary: Negative.   Musculoskeletal: Negative.   Skin: Negative.   Allergic/Immunologic: Negative.   Neurological: Negative.   Hematological: Negative.   Psychiatric/Behavioral: Negative.     Social History   Socioeconomic History  . Marital status: Married    Spouse name: Not on file  . Number of children: Not on file  . Years of education: Not on file  . Highest education level: Not on file  Social Needs  . Financial resource strain: Not on file  . Food insecurity - worry: Not on file  . Food insecurity - inability: Not on file  . Transportation needs - medical: Not on file  . Transportation needs - non-medical: Not on file  Occupational History  . Not on file  Tobacco Use  . Smoking status: Former Smoker    Types: Cigars  . Smokeless tobacco: Never Used  . Tobacco comment: quit cig 2 yrs ago, social smoking only  Substance and Sexual Activity  . Alcohol use: Yes    Comment: occasional 3 x week  . Drug use: No  . Sexual activity: Yes  Other Topics Concern  . Not on file  Social History Narrative  . Not on file    Past Medical History:  Diagnosis Date  . Cancer [facility])    prostate  . GERD (gastroesophageal reflux disease)   . Hypertension   . OAG (open angle glaucoma)       Patient Active Problem List   Diagnosis Date Noted  . Rotator cuff tear 04/15/2016  . Prostate cancer ([place]) 06/03/2015  . Benign fibroma of prostate 05/26/2015  . Internal hemorrhoids 05/26/2015  . Personal history of urinary calculi 05/26/2015  . H/O meningitis 05/26/2015  . BP (high blood pressure) 05/26/2015  . Lumbar radiculopathy 05/26/2015  . Arthritis, degenerative 05/26/2015  . Hyperlipidemia 05/26/2015  . Open-angle glaucoma 08/09/2013  . Lumbar canal stenosis 11/24/2012  . Degeneration of intervertebral disc of lumbosacral region 11/24/2012    Past Surgical History:  Procedure Laterality Date  . ANTERIOR CRUCIATE LIGAMENT REPAIR  25 yrs ago   not sure which side  . COLONOSCOPY WITH PROPOFOL N/A 10/04/2016   Procedure: COLONOSCOPY WITH PROPOFOL;  Surgeon: Manya Silvas, MD;  Location: [facility] ENDOSCOPY;  Service: Endoscopy;  Laterality: N/A;  . ESOPHAGOGASTRODUODENOSCOPY (EGD) WITH PROPOFOL N/A 10/04/2016   Procedure: ESOPHAGOGASTRODUODENOSCOPY (EGD) WITH PROPOFOL;  Surgeon: Manya Silvas, MD;  Location: [facility] ENDOSCOPY;  Service: Endoscopy;  Laterality: N/A;  . EYE SURGERY  04/2015   lens surgery from a dislodged les from cataract surgery  . LYMPHADENECTOMY  06/19/2012   Procedure: LYMPHADENECTOMY;  Surgeon: Dutch Gray, MD;  Location: [facility] ORS;  Service: Urology;  Laterality: Bilateral;  . ROBOT ASSISTED LAPAROSCOPIC RADICAL PROSTATECTOMY  06/19/2012   Procedure: ROBOTIC ASSISTED LAPAROSCOPIC RADICAL PROSTATECTOMY LEVEL 2;  Surgeon: Dutch Gray, MD;  Location: [facility] ORS;  Service: Urology;  Laterality: N/A;  . TOTAL HIP ARTHROPLASTY Right 2015    His family history includes Cancer in his brother, father, and mother; Diabetes in his mother; Heart disease in his mother; Stroke in his mother.      Current Outpatient Medications:  .  aspirin 81 MG tablet, Take 1 tablet (81 mg total) by mouth daily., Disp: 30 tablet, Rfl: 12 .  calcium carbonate (TUMS - DOSED IN MG ELEMENTAL  CALCIUM) 500 MG chewable tablet, Chew 1 tablet by mouth daily., Disp: , Rfl:  .  lisinopril (PRINIVIL,ZESTRIL) 20 MG tablet, TAKE 1 TABLET BY MOUTH DAILY, Disp: 90 tablet, Rfl: 3 .  MULTIPLE VITAMIN PO, Take by mouth., Disp: , Rfl:  .  timolol (TIMOPTIC) 0.25 % ophthalmic solution, Apply to eye., Disp: , Rfl:  .  Travoprost, BAK Free, (TRAVATAN Z) 0.004 % SOLN ophthalmic solution, Apply to eye., Disp: , Rfl:  .  valACYclovir (VALTREX) 1000 MG tablet, Take 1,000 mg by mouth as needed. Only for outbreaks of fever blisters, Disp: , Rfl:  .  doxycycline (VIBRA-TABS) 100 MG tablet, Take 1 tablet (100 mg total) by mouth 2 (two) times daily. (Patient not taking: Reported on 03/22/2017), Disp: 20 tablet, Rfl: 0 .  tadalafil (CIALIS) 5 MG tablet, Take by mouth., Disp: , Rfl:  .  triamcinolone cream (KENALOG) 0.1 %, Apply 1 application topically 3 (three) times daily. To insect bites (Patient not taking: Reported on 06/30/2017), Disp: 30 g, Rfl: 0  Patient Care Team: Jerrol Banana., MD as PCP - General (Family Medicine)     Objective:   Vitals: BP 124/72 (BP Location: Left Arm, Patient Position: Sitting, Cuff Size: Normal)   Pulse 72   Temp 98.1 F (36.7 C) (Oral)   Resp 16   Ht 5\' 11"  (1.803 m)   Wt 202 lb (91.6 kg)   SpO2 98%   BMI 28.17 kg/m   Physical Exam  Constitutional: He is oriented to person, place, and time. He appears well-developed and well-nourished.  HENT:  Head: Normocephalic and atraumatic.  Right Ear: External ear normal.  Left Ear: External ear normal.  Nose: Nose normal.  Mouth/Throat: Oropharynx is clear and moist.  Eyes: Conjunctivae and EOM are normal. Pupils are equal, round, and reactive to light.  Neck: Normal range of motion. Neck supple.  Cardiovascular: Normal rate, regular rhythm, normal heart sounds and intact distal pulses.  Pulmonary/Chest: Effort normal and breath sounds normal.  Abdominal: Soft. Bowel sounds are normal.  Genitourinary: Rectum  normal, prostate normal and penis normal.  Musculoskeletal: Normal range of motion.  Neurological: He is alert and oriented to person, place, and time. He has normal reflexes.  Skin: Skin is warm and dry.  Psychiatric: He has a normal mood and affect. His behavior is normal. Judgment and thought content normal.    Activities of Daily Living In your present state of health, do you have any difficulty performing the following activities: 06/30/2017  Hearing? Y  Vision? N  Difficulty concentrating or making decisions? N  Walking or climbing stairs? N  Dressing or bathing? N  Doing errands, shopping? N  Some recent data might be hidden    Fall Risk Assessment Fall Risk  06/30/2017 06/08/2016 06/03/2015  Falls in the past year? No No No     Depression Screen PHQ 2/9 Scores 06/30/2017 06/08/2016 06/03/2015  PHQ - 2 Score 0 0 0    Cognitive Testing -  6-CIT  Correct? Score   What year is it? yes 0 0 or 4  What month is it? yes 0 0 or 3  Memorize:    Pia Mau,  40,  [address],  [place],      What time is it? (within 1 hour) yes 0 0 or 3  Count backwards from 20 yes 0 0, 2, or 4  Name the months of the year yes 0 0, 2, or 4  Repeat name & address above yes 2 0, 2, 4, 6, 8, or 10       TOTAL SCORE  2/28   Interpretation:  Normal  Normal (0-7) Abnormal (8-28)       Assessment & Plan:     Annual Wellness Visit  Reviewed patient's Family Medical History Reviewed and updated list of patient's medical providers Assessment of cognitive impairment was done Assessed patient's functional ability Established a written schedule for health screening [place] Completed and Reviewed  Exercise Activities and Dietary recommendations Goals    None      Immunization History  Administered Date(s) Administered  . Influenza, High Dose Seasonal PF 04/15/2016  . Pneumococcal Conjugate-13 06/08/2016  . Zoster 05/19/2011    Health Maintenance  Topic Date Due    . Samul Dada  12/20/1967  . PNA vac Low Risk Adult (2 of 2 - PPSV23) 06/08/2017  . INFLUENZA VACCINE  02/23/2018 (Originally 02/23/2017)  . COLONOSCOPY  10/05/2019  . Hepatitis C Screening  Completed     Discussed health benefits of physical activity, and encouraged him to engage in regular exercise appropriate for his age and condition.  URI HTN HLD OA S/p Bilat THR   ------------------------------------------------------------------------------------------------------------   I have done the exam and reviewed the above chart and it is accurate to the best of my knowledge. Development worker, community has been used in this note in any air is in the dictation or transcription are unintentional.  [name], MD  [place]

## 2017-07-01 DIAGNOSIS — I10 Essential (primary) hypertension: Secondary | ICD-10-CM | POA: Diagnosis not present

## 2017-07-01 DIAGNOSIS — N400 Enlarged prostate without lower urinary tract symptoms: Secondary | ICD-10-CM | POA: Diagnosis not present

## 2017-07-01 DIAGNOSIS — E7800 Pure hypercholesterolemia, unspecified: Secondary | ICD-10-CM | POA: Diagnosis not present

## 2017-07-02 LAB — CBC WITH DIFFERENTIAL/PLATELET
BASOS PCT: 0.6 %
Basophils Absolute: 50 cells/uL (ref 0–200)
EOS ABS: 101 {cells}/uL (ref 15–500)
Eosinophils Relative: 1.2 %
HEMATOCRIT: 41.1 % (ref 38.5–50.0)
HEMOGLOBIN: 13.8 g/dL (ref 13.2–17.1)
LYMPHS ABS: 1252 {cells}/uL (ref 850–3900)
MCH: 30.9 pg (ref 27.0–33.0)
MCHC: 33.6 g/dL (ref 32.0–36.0)
MCV: 92.2 fL (ref 80.0–100.0)
MPV: 10.2 fL (ref 7.5–12.5)
Monocytes Relative: 8.5 %
NEUTROS ABS: 6283 {cells}/uL (ref 1500–7800)
Neutrophils Relative %: 74.8 %
Platelets: 213 10*3/uL (ref 140–400)
RBC: 4.46 10*6/uL (ref 4.20–5.80)
RDW: 12.6 % (ref 11.0–15.0)
Total Lymphocyte: 14.9 %
WBC: 8.4 10*3/uL (ref 3.8–10.8)
WBCMIX: 714 {cells}/uL (ref 200–950)

## 2017-07-02 LAB — LIPID PANEL
CHOL/HDL RATIO: 3.4 (calc) (ref <5.0)
Cholesterol: 170 mg/dL (ref <200)
HDL: 50 mg/dL (ref >40)
LDL CHOLESTEROL (CALC): 100 mg/dL — AB
Non-HDL Cholesterol (Calc): 120 mg/dL (calc) (ref <130)
TRIGLYCERIDES: 104 mg/dL (ref <150)

## 2017-07-02 LAB — COMPLETE METABOLIC PANEL WITH GFR
AG RATIO: 1.2 (calc) (ref 1.0–2.5)
ALKALINE PHOSPHATASE (APISO): 59 U/L (ref 40–115)
ALT: 12 U/L (ref 9–46)
AST: 15 U/L (ref 10–35)
Albumin: 3.6 g/dL (ref 3.6–5.1)
BILIRUBIN TOTAL: 0.4 mg/dL (ref 0.2–1.2)
BUN: 17 mg/dL (ref 7–25)
CHLORIDE: 106 mmol/L (ref 98–110)
CO2: 24 mmol/L (ref 20–32)
Calcium: 9.3 mg/dL (ref 8.6–10.3)
Creat: 0.84 mg/dL (ref 0.70–1.25)
GFR, EST AFRICAN AMERICAN: 104 mL/min/{1.73_m2} (ref >=60)
GFR, Est Non African American: 90 mL/min/{1.73_m2} (ref >=60)
GLUCOSE: 98 mg/dL (ref 65–99)
Globulin: 2.9 g/dL (calc) (ref 1.9–3.7)
POTASSIUM: 4.2 mmol/L (ref 3.5–5.3)
Sodium: 138 mmol/L (ref 135–146)
Total Protein: 6.5 g/dL (ref 6.1–8.1)

## 2017-07-02 LAB — TSH: TSH: 1.39 m[IU]/L (ref 0.40–4.50)

## 2017-07-02 LAB — PSA

## 2017-07-12 ENCOUNTER — Telehealth: Payer: Self-pay

## 2017-07-12 DIAGNOSIS — M9901 Segmental and somatic dysfunction of cervical region: Secondary | ICD-10-CM | POA: Diagnosis not present

## 2017-07-12 DIAGNOSIS — M5416 Radiculopathy, lumbar region: Secondary | ICD-10-CM | POA: Diagnosis not present

## 2017-07-12 DIAGNOSIS — M9903 Segmental and somatic dysfunction of lumbar region: Secondary | ICD-10-CM | POA: Diagnosis not present

## 2017-07-12 DIAGNOSIS — M5033 Other cervical disc degeneration, cervicothoracic region: Secondary | ICD-10-CM | POA: Diagnosis not present

## 2017-07-12 NOTE — Telephone Encounter (Signed)
lmtcb

## 2017-07-12 NOTE — Telephone Encounter (Signed)
Advised  ED 

## 2017-07-12 NOTE — Telephone Encounter (Signed)
-----   Message from Jerrol Banana., MD sent at 07/11/2017  4:24 PM EST ----- Labs OK

## 2017-07-12 NOTE — Telephone Encounter (Signed)
LMTCB ED 

## 2017-07-12 NOTE — Telephone Encounter (Signed)
Pt is returning call.  CB#[phone]/MW

## 2017-08-11 DIAGNOSIS — D225 Melanocytic nevi of trunk: Secondary | ICD-10-CM | POA: Diagnosis not present

## 2017-08-11 DIAGNOSIS — Z1283 Encounter for screening for malignant neoplasm of skin: Secondary | ICD-10-CM | POA: Diagnosis not present

## 2017-08-11 DIAGNOSIS — L821 Other seborrheic keratosis: Secondary | ICD-10-CM | POA: Diagnosis not present

## 2017-08-11 DIAGNOSIS — L719 Rosacea, unspecified: Secondary | ICD-10-CM | POA: Diagnosis not present

## 2017-08-11 DIAGNOSIS — Z85828 Personal history of other malignant neoplasm of skin: Secondary | ICD-10-CM | POA: Diagnosis not present

## 2017-08-11 DIAGNOSIS — L570 Actinic keratosis: Secondary | ICD-10-CM | POA: Diagnosis not present

## 2017-08-11 DIAGNOSIS — D1800 Hemangioma unspecified site: Secondary | ICD-10-CM | POA: Diagnosis not present

## 2017-08-11 DIAGNOSIS — L820 Inflamed seborrheic keratosis: Secondary | ICD-10-CM | POA: Diagnosis not present

## 2017-08-11 DIAGNOSIS — L578 Other skin changes due to chronic exposure to nonionizing radiation: Secondary | ICD-10-CM | POA: Diagnosis not present

## 2017-08-11 DIAGNOSIS — L812 Freckles: Secondary | ICD-10-CM | POA: Diagnosis not present

## 2017-08-11 DIAGNOSIS — D485 Neoplasm of uncertain behavior of skin: Secondary | ICD-10-CM | POA: Diagnosis not present

## 2017-08-24 DIAGNOSIS — M47817 Spondylosis without myelopathy or radiculopathy, lumbosacral region: Secondary | ICD-10-CM | POA: Diagnosis not present

## 2017-10-03 DIAGNOSIS — Z8546 Personal history of malignant neoplasm of prostate: Secondary | ICD-10-CM | POA: Diagnosis not present

## 2017-10-10 DIAGNOSIS — H1033 Unspecified acute conjunctivitis, bilateral: Secondary | ICD-10-CM | POA: Diagnosis not present

## 2017-10-11 DIAGNOSIS — N5201 Erectile dysfunction due to arterial insufficiency: Secondary | ICD-10-CM | POA: Diagnosis not present

## 2017-10-11 DIAGNOSIS — Z8546 Personal history of malignant neoplasm of prostate: Secondary | ICD-10-CM | POA: Diagnosis not present

## 2017-10-12 DIAGNOSIS — H1033 Unspecified acute conjunctivitis, bilateral: Secondary | ICD-10-CM | POA: Diagnosis not present

## 2017-10-17 DIAGNOSIS — M47817 Spondylosis without myelopathy or radiculopathy, lumbosacral region: Secondary | ICD-10-CM | POA: Diagnosis not present

## 2017-10-25 DIAGNOSIS — M5033 Other cervical disc degeneration, cervicothoracic region: Secondary | ICD-10-CM | POA: Diagnosis not present

## 2017-10-25 DIAGNOSIS — M9903 Segmental and somatic dysfunction of lumbar region: Secondary | ICD-10-CM | POA: Diagnosis not present

## 2017-10-25 DIAGNOSIS — M5416 Radiculopathy, lumbar region: Secondary | ICD-10-CM | POA: Diagnosis not present

## 2017-10-25 DIAGNOSIS — M9901 Segmental and somatic dysfunction of cervical region: Secondary | ICD-10-CM | POA: Diagnosis not present

## 2017-10-27 NOTE — Telephone Encounter (Signed)
complete

## 2017-11-07 DIAGNOSIS — M5126 Other intervertebral disc displacement, lumbar region: Secondary | ICD-10-CM | POA: Diagnosis not present

## 2017-11-07 DIAGNOSIS — M545 Low back pain: Secondary | ICD-10-CM | POA: Diagnosis not present

## 2017-11-09 DIAGNOSIS — M5136 Other intervertebral disc degeneration, lumbar region: Secondary | ICD-10-CM | POA: Diagnosis not present

## 2017-11-09 DIAGNOSIS — M5416 Radiculopathy, lumbar region: Secondary | ICD-10-CM | POA: Diagnosis not present

## 2017-11-16 DIAGNOSIS — M5416 Radiculopathy, lumbar region: Secondary | ICD-10-CM | POA: Diagnosis not present

## 2017-11-22 ENCOUNTER — Encounter: Payer: Self-pay | Admitting: Physician Assistant

## 2017-11-22 ENCOUNTER — Ambulatory Visit (INDEPENDENT_AMBULATORY_CARE_PROVIDER_SITE_OTHER): Payer: Medicare Other | Admitting: Physician Assistant

## 2017-11-22 VITALS — BP 132/78 | HR 82 | Temp 100.0°F | Resp 16 | Ht 71.0 in | Wt 201.0 lb

## 2017-11-22 DIAGNOSIS — B9789 Other viral agents as the cause of diseases classified elsewhere: Secondary | ICD-10-CM

## 2017-11-22 DIAGNOSIS — J069 Acute upper respiratory infection, unspecified: Secondary | ICD-10-CM

## 2017-11-22 MED ORDER — HYDROCODONE-HOMATROPINE 5-1.5 MG/5ML PO SYRP: 5.0000 mL | ORAL_SOLUTION | Freq: Three times a day (TID) | ORAL | 0 refills | Status: DC | PRN

## 2017-11-22 MED ORDER — AZELASTINE HCL 0.1 % NA SOLN: 2.0000 | Freq: Two times a day (BID) | NASAL | 12 refills | Status: DC

## 2017-11-22 NOTE — Progress Notes (Signed)
[place]  Chief Complaint  Patient presents with  . Sinusitis    Started five days ago.  [name] Kitchen URI    Subjective:    Patient ID: Dennis Steele, male    DOB: 1950/12/22, 67 y.o.   MRN: [id number]  Upper Respiratory Infection: Dennis Steele is a 67 y.o. male with a past medical history significant for allergic rhinitis complaining of symptoms of a URI, possible sinusitis. Symptoms include congestion, cough and sore throat. Onset of symptoms was 5 days ago, gradually worsening since that time. He also c/o congestion, cough described as productive, nasal congestion, post nasal drip, shortness of breath, sinus pressure, sore throat and wheezing for the past 5 days .  He is drinking plenty of fluids. Evaluation to date: none. Treatment to date: antihistamines, cough suppressants, decongestants and nasal steroids. The treatment has provided minimal.   Review of Systems  Constitutional: Positive for fatigue. Negative for activity change, appetite change, chills, diaphoresis, fever and unexpected weight change.  HENT: Positive for congestion, postnasal drip, sinus pressure, sinus pain, sneezing, sore throat, tinnitus and trouble swallowing. Negative for ear discharge, ear pain, hearing loss, nosebleeds and rhinorrhea.   Eyes: Negative.   Respiratory: Positive for cough, chest tightness and wheezing. Negative for apnea, choking, shortness of breath and stridor.   Gastrointestinal: Negative.   Musculoskeletal: Negative for neck pain and neck stiffness.  Neurological: Positive for headaches. Negative for dizziness and light-headedness.  Hematological: Negative for adenopathy.       Objective:   BP 132/78 (BP Location: Left Arm, Patient Position: Sitting, Cuff Size: Large)   Pulse 82   Temp 100 F (37.8 C) (Oral)   Resp 16   Ht 5\' 11"  (1.803 m)   Wt 201 lb (91.2 kg)   SpO2 96%   BMI 28.03 kg/m   Patient Active Problem List   Diagnosis Date Noted  .  Rotator cuff tear 04/15/2016  . Prostate cancer ([place]) 06/03/2015  . Benign fibroma of prostate 05/26/2015  . Internal hemorrhoids 05/26/2015  . Personal history of urinary calculi 05/26/2015  . H/O meningitis 05/26/2015  . BP (high blood pressure) 05/26/2015  . Lumbar radiculopathy 05/26/2015  . Arthritis, degenerative 05/26/2015  . Hyperlipidemia 05/26/2015  . Open-angle glaucoma 08/09/2013  . Lumbar canal stenosis 11/24/2012  . Degeneration of intervertebral disc of lumbosacral region 11/24/2012    Outpatient Encounter Medications as of 11/22/2017  Medication Sig Note  . aspirin 81 MG tablet Take 1 tablet (81 mg total) by mouth daily.   . calcium carbonate (TUMS - DOSED IN MG ELEMENTAL CALCIUM) 500 MG chewable tablet Chew 1 tablet by mouth daily.   [name] Kitchen lisinopril (PRINIVIL,ZESTRIL) 20 MG tablet TAKE 1 TABLET BY MOUTH DAILY   . MULTIPLE VITAMIN PO Take by mouth. 06/03/2015: Received from: [facility]  . tadalafil (CIALIS) 5 MG tablet Take by mouth. 06/03/2015: Received from: [facility]  . timolol (TIMOPTIC) 0.25 % ophthalmic solution Apply to eye. 06/03/2015: Received from: [facility]  . Travoprost, BAK Free, (TRAVATAN Z) 0.004 % SOLN ophthalmic solution Apply to eye. 06/03/2015: Received from: [facility]  . triamcinolone cream (KENALOG) 0.1 % Apply 1 application topically 3 (three) times daily. To insect bites   . valACYclovir (VALTREX) 1000 MG tablet Take 1,000 mg by mouth as needed. Only for outbreaks of fever blisters   . azelastine (ASTELIN) 0.1 % nasal spray Place 2 sprays into both nostrils 2 (two) times daily. Use in  each nostril as directed   . doxycycline (VIBRA-TABS) 100 MG tablet Take 1 tablet (100 mg total) by mouth 2 (two) times daily. (Patient not taking: Reported on 03/22/2017)   . HYDROcodone-homatropine (HYCODAN) 5-1.5 MG/5ML syrup Take 5 mLs by mouth every 8 (eight) hours as needed for cough.    No  facility-administered encounter medications on file as of 11/22/2017.     No Known Allergies     Physical Exam  Constitutional: He is oriented to person, place, and time. He appears well-developed and well-nourished.  HENT:  Right Ear: Tympanic membrane and external ear normal.  Left Ear: Tympanic membrane and external ear normal.  Mouth/Throat: Oropharynx is clear and moist. No oropharyngeal exudate.  Eyes: Right eye exhibits no discharge. Left eye exhibits no discharge.  Neck: Neck supple.  Cardiovascular: Normal rate and regular rhythm.  Pulmonary/Chest: Effort normal and breath sounds normal.  Lymphadenopathy:    He has no cervical adenopathy.  Neurological: He is alert and oriented to person, place, and time.  Skin: Skin is warm and dry.  Psychiatric: He has a normal mood and affect. His behavior is normal.       Assessment & Plan:  1. Viral URI with cough  Treat symptomatically, counseled on course of illness. Call back one week if not improving.   - azelastine (ASTELIN) 0.1 % nasal spray; Place 2 sprays into both nostrils 2 (two) times daily. Use in each nostril as directed  Dispense: 30 mL; Refill: 12 - HYDROcodone-homatropine (HYCODAN) 5-1.5 MG/5ML syrup; Take 5 mLs by mouth every 8 (eight) hours as needed for cough.  Dispense: 120 mL; Refill: 0  Return if symptoms worsen or fail to improve.   The entirety of the information documented in the History of Present Illness, Review of Systems and Physical Exam were personally obtained by me. Portions of this information were initially documented by Ashley Royalty, CMA and reviewed by me for thoroughness and accuracy.

## 2017-11-22 NOTE — Patient Instructions (Signed)
Upper Respiratory Infection, Adult Most upper respiratory infections (URIs) are caused by a virus. A URI affects the nose, throat, and upper air passages. The most common type of URI is often called "the common cold." Follow these instructions at home:  Take medicines only as told by your doctor.  Gargle warm saltwater or take cough drops to comfort your throat as told by your doctor.  Use a warm mist humidifier or inhale steam from a shower to increase air moisture. This may make it easier to breathe.  Drink enough fluid to keep your pee (urine) clear or pale yellow.  Eat soups and other clear broths.  Have a healthy diet.  Rest as needed.  Go back to work when your fever is gone or your doctor says it is okay. ? You may need to stay home longer to avoid giving your URI to others. ? You can also wear a face mask and wash your hands often to prevent spread of the virus.  Use your inhaler more if you have asthma.  Do not use any tobacco products, including cigarettes, chewing tobacco, or electronic cigarettes. If you need help quitting, ask your doctor. Contact a doctor if:  You are getting worse, not better.  Your symptoms are not helped by medicine.  You have chills.  You are getting more short of breath.  You have brown or red mucus.  You have yellow or brown discharge from your nose.  You have pain in your face, especially when you bend forward.  You have a fever.  You have puffy (swollen) neck glands.  You have pain while swallowing.  You have white areas in the back of your throat. Get help right away if:  You have very bad or constant: ? Headache. ? Ear pain. ? Pain in your forehead, behind your eyes, and over your cheekbones (sinus pain). ? Chest pain.  You have long-lasting (chronic) lung disease and any of the following: ? Wheezing. ? Long-lasting cough. ? Coughing up blood. ? A change in your usual mucus.  You have a stiff neck.  You have  changes in your: ? Vision. ? Hearing. ? Thinking. ? Mood. This information is not intended to replace advice given to you by your health care provider. Make sure you discuss any questions you have with your health care provider. Document Released: 12/29/2007 Document Revised: 03/14/2016 Document Reviewed: 10/17/2013 Elsevier Interactive Patient Education  2018 [employer] Inc[employer]  

## 2017-12-05 DIAGNOSIS — M5416 Radiculopathy, lumbar region: Secondary | ICD-10-CM | POA: Diagnosis not present

## 2017-12-05 DIAGNOSIS — Z01818 Encounter for other preprocedural examination: Secondary | ICD-10-CM | POA: Diagnosis not present

## 2017-12-05 DIAGNOSIS — M48062 Spinal stenosis, lumbar region with neurogenic claudication: Secondary | ICD-10-CM | POA: Diagnosis not present

## 2017-12-06 ENCOUNTER — Ambulatory Visit: Payer: Self-pay

## 2017-12-07 ENCOUNTER — Telehealth: Payer: Self-pay | Admitting: Family Medicine

## 2017-12-07 NOTE — Telephone Encounter (Signed)
Received Medical Clearance form from [facility], placed form in providers box. Thanks CC

## 2017-12-08 NOTE — Telephone Encounter (Signed)
Spoke with [facility] and they only want [place] to send the last OV, EKG and Labs.  No clearance needed for them.

## 2017-12-12 DIAGNOSIS — M48061 Spinal stenosis, lumbar region without neurogenic claudication: Secondary | ICD-10-CM | POA: Diagnosis not present

## 2017-12-12 DIAGNOSIS — Z8546 Personal history of malignant neoplasm of prostate: Secondary | ICD-10-CM | POA: Diagnosis not present

## 2017-12-12 DIAGNOSIS — M545 Low back pain: Secondary | ICD-10-CM | POA: Diagnosis not present

## 2017-12-12 DIAGNOSIS — Z87891 Personal history of nicotine dependence: Secondary | ICD-10-CM | POA: Diagnosis not present

## 2017-12-12 DIAGNOSIS — M48062 Spinal stenosis, lumbar region with neurogenic claudication: Secondary | ICD-10-CM | POA: Diagnosis not present

## 2017-12-12 DIAGNOSIS — Z96649 Presence of unspecified artificial hip joint: Secondary | ICD-10-CM | POA: Diagnosis not present

## 2017-12-12 DIAGNOSIS — M7138 Other bursal cyst, other site: Secondary | ICD-10-CM | POA: Diagnosis not present

## 2017-12-12 DIAGNOSIS — I10 Essential (primary) hypertension: Secondary | ICD-10-CM | POA: Diagnosis not present

## 2017-12-12 DIAGNOSIS — H409 Unspecified glaucoma: Secondary | ICD-10-CM | POA: Diagnosis not present

## 2017-12-12 DIAGNOSIS — M4316 Spondylolisthesis, lumbar region: Secondary | ICD-10-CM | POA: Diagnosis not present

## 2017-12-13 DIAGNOSIS — Z96649 Presence of unspecified artificial hip joint: Secondary | ICD-10-CM | POA: Diagnosis not present

## 2017-12-13 DIAGNOSIS — M4316 Spondylolisthesis, lumbar region: Secondary | ICD-10-CM | POA: Diagnosis not present

## 2017-12-13 DIAGNOSIS — Z87891 Personal history of nicotine dependence: Secondary | ICD-10-CM | POA: Diagnosis not present

## 2017-12-13 DIAGNOSIS — M7138 Other bursal cyst, other site: Secondary | ICD-10-CM | POA: Diagnosis not present

## 2017-12-13 DIAGNOSIS — M48061 Spinal stenosis, lumbar region without neurogenic claudication: Secondary | ICD-10-CM | POA: Diagnosis not present

## 2017-12-13 DIAGNOSIS — I10 Essential (primary) hypertension: Secondary | ICD-10-CM | POA: Diagnosis not present

## 2018-01-16 DIAGNOSIS — Z981 Arthrodesis status: Secondary | ICD-10-CM | POA: Diagnosis not present

## 2018-01-16 DIAGNOSIS — M5126 Other intervertebral disc displacement, lumbar region: Secondary | ICD-10-CM | POA: Diagnosis not present

## 2018-01-23 DIAGNOSIS — J019 Acute sinusitis, unspecified: Secondary | ICD-10-CM | POA: Diagnosis not present

## 2018-02-15 DIAGNOSIS — Z981 Arthrodesis status: Secondary | ICD-10-CM | POA: Diagnosis not present

## 2018-02-15 DIAGNOSIS — M5126 Other intervertebral disc displacement, lumbar region: Secondary | ICD-10-CM | POA: Diagnosis not present

## 2018-03-02 DIAGNOSIS — R2689 Other abnormalities of gait and mobility: Secondary | ICD-10-CM | POA: Diagnosis not present

## 2018-03-02 DIAGNOSIS — M6281 Muscle weakness (generalized): Secondary | ICD-10-CM | POA: Diagnosis not present

## 2018-03-06 DIAGNOSIS — M6281 Muscle weakness (generalized): Secondary | ICD-10-CM | POA: Diagnosis not present

## 2018-03-06 DIAGNOSIS — R2689 Other abnormalities of gait and mobility: Secondary | ICD-10-CM | POA: Diagnosis not present

## 2018-03-07 ENCOUNTER — Other Ambulatory Visit: Payer: Self-pay | Admitting: Family Medicine

## 2018-03-07 DIAGNOSIS — I10 Essential (primary) hypertension: Secondary | ICD-10-CM

## 2018-03-08 DIAGNOSIS — B353 Tinea pedis: Secondary | ICD-10-CM | POA: Diagnosis not present

## 2018-03-08 DIAGNOSIS — L400 Psoriasis vulgaris: Secondary | ICD-10-CM | POA: Diagnosis not present

## 2018-03-15 DIAGNOSIS — R2689 Other abnormalities of gait and mobility: Secondary | ICD-10-CM | POA: Diagnosis not present

## 2018-03-15 DIAGNOSIS — M6281 Muscle weakness (generalized): Secondary | ICD-10-CM | POA: Diagnosis not present

## 2018-03-20 DIAGNOSIS — M7138 Other bursal cyst, other site: Secondary | ICD-10-CM | POA: Diagnosis not present

## 2018-03-20 DIAGNOSIS — M545 Low back pain: Secondary | ICD-10-CM | POA: Diagnosis not present

## 2018-03-21 DIAGNOSIS — M6281 Muscle weakness (generalized): Secondary | ICD-10-CM | POA: Diagnosis not present

## 2018-03-21 DIAGNOSIS — R2689 Other abnormalities of gait and mobility: Secondary | ICD-10-CM | POA: Diagnosis not present

## 2018-03-30 DIAGNOSIS — M5416 Radiculopathy, lumbar region: Secondary | ICD-10-CM | POA: Diagnosis not present

## 2018-03-30 DIAGNOSIS — M9903 Segmental and somatic dysfunction of lumbar region: Secondary | ICD-10-CM | POA: Diagnosis not present

## 2018-03-30 DIAGNOSIS — M6281 Muscle weakness (generalized): Secondary | ICD-10-CM | POA: Diagnosis not present

## 2018-03-30 DIAGNOSIS — M5033 Other cervical disc degeneration, cervicothoracic region: Secondary | ICD-10-CM | POA: Diagnosis not present

## 2018-03-30 DIAGNOSIS — R2689 Other abnormalities of gait and mobility: Secondary | ICD-10-CM | POA: Diagnosis not present

## 2018-03-30 DIAGNOSIS — M9901 Segmental and somatic dysfunction of cervical region: Secondary | ICD-10-CM | POA: Diagnosis not present

## 2018-04-04 DIAGNOSIS — R2689 Other abnormalities of gait and mobility: Secondary | ICD-10-CM | POA: Diagnosis not present

## 2018-04-04 DIAGNOSIS — M6281 Muscle weakness (generalized): Secondary | ICD-10-CM | POA: Diagnosis not present

## 2018-04-13 DIAGNOSIS — L400 Psoriasis vulgaris: Secondary | ICD-10-CM | POA: Diagnosis not present

## 2018-04-13 DIAGNOSIS — B359 Dermatophytosis, unspecified: Secondary | ICD-10-CM | POA: Diagnosis not present

## 2018-04-18 DIAGNOSIS — R2689 Other abnormalities of gait and mobility: Secondary | ICD-10-CM | POA: Diagnosis not present

## 2018-04-18 DIAGNOSIS — M6281 Muscle weakness (generalized): Secondary | ICD-10-CM | POA: Diagnosis not present

## 2018-04-25 DIAGNOSIS — M9903 Segmental and somatic dysfunction of lumbar region: Secondary | ICD-10-CM | POA: Diagnosis not present

## 2018-04-25 DIAGNOSIS — M5033 Other cervical disc degeneration, cervicothoracic region: Secondary | ICD-10-CM | POA: Diagnosis not present

## 2018-04-25 DIAGNOSIS — M5416 Radiculopathy, lumbar region: Secondary | ICD-10-CM | POA: Diagnosis not present

## 2018-04-25 DIAGNOSIS — M9901 Segmental and somatic dysfunction of cervical region: Secondary | ICD-10-CM | POA: Diagnosis not present

## 2018-05-02 DIAGNOSIS — M6281 Muscle weakness (generalized): Secondary | ICD-10-CM | POA: Diagnosis not present

## 2018-05-02 DIAGNOSIS — R2689 Other abnormalities of gait and mobility: Secondary | ICD-10-CM | POA: Diagnosis not present

## 2018-05-10 ENCOUNTER — Ambulatory Visit (INDEPENDENT_AMBULATORY_CARE_PROVIDER_SITE_OTHER): Payer: Medicare Other | Admitting: Family Medicine

## 2018-05-10 ENCOUNTER — Encounter: Payer: Self-pay | Admitting: Family Medicine

## 2018-05-10 VITALS — BP 120/72 | HR 83 | Temp 98.2°F | Wt 202.8 lb

## 2018-05-10 DIAGNOSIS — R61 Generalized hyperhidrosis: Secondary | ICD-10-CM | POA: Diagnosis not present

## 2018-05-10 DIAGNOSIS — R232 Flushing: Secondary | ICD-10-CM | POA: Diagnosis not present

## 2018-05-10 NOTE — Progress Notes (Signed)
Patient: Dennis Steele Male    DOB: Mar 27, 1951   67 y.o.   MRN: [id number] Visit Date: 05/10/2018  Today's Provider: Lavon Paganini, MD   Chief Complaint  Patient presents with  . Fever  . Headache  . Generalized Body Aches   Subjective:    I, Tiburcio Pea, CMA, am acting as a scribe for Lavon Paganini, MD.   URI   This is a new problem. Episode onset: 1 week ago at night time only. The problem has been waxing and waning. Maximum temperature: unknown, but patient states he felt "feverish"  Associated symptoms include coughing (mild) and headaches. Pertinent negatives include no congestion or sinus pain. Associated symptoms comments:  body ache . Treatments tried: Tylenol and Motrin. The treatment provided mild relief.    No recent international travel  The day that it started about 1 wk ago, he had been out playing golf and forgot to put on sunscreen.  That evening, he developed a hot, flushing feeling and felt as though he had a fever, but thermometer was not working.  Took Tylenol PM, slept well, and felt well in the morning.  This continued each evening for the last week.  He then developed a bad headache last night that has now resolved.  He states this is out of the usual for him.  He has had some cold sweats and she soaked through a short of few times in the evenings.  He denies any infectious symptoms such as sinus congestion, runny nose, shortness of breath, wheezing, urinary symptoms.  He has had many sinus infections previously, but states this does not feel like a sinus infection.  He has a bit of a cough at baseline but has not changed.  He has a history of prostate cancer status post resection about 9 years ago.  His last PSA was undetectable less than 1 year ago.  He is taking Cialis daily for ED.  He has been taking this for about 6 years.  He recently switched to a generic version.  He is followed by urology annually.   No Known Allergies   Current  Outpatient Medications:  .  aspirin 81 MG tablet, Take 1 tablet (81 mg total) by mouth daily., Disp: 30 tablet, Rfl: 12 .  azelastine (ASTELIN) 0.1 % nasal spray, Place 2 sprays into both nostrils 2 (two) times daily. Use in each nostril as directed, Disp: 30 mL, Rfl: 12 .  calcium carbonate (TUMS - DOSED IN MG ELEMENTAL CALCIUM) 500 MG chewable tablet, Chew 1 tablet by mouth daily., Disp: , Rfl:  .  lisinopril (PRINIVIL,ZESTRIL) 20 MG tablet, TAKE ONE TABLET EVERY DAY, Disp: 90 tablet, Rfl: 3 .  MULTIPLE VITAMIN PO, Take by mouth., Disp: , Rfl:  .  tadalafil (CIALIS) 5 MG tablet, Take by mouth., Disp: , Rfl:  .  timolol (TIMOPTIC) 0.25 % ophthalmic solution, Apply to eye., Disp: , Rfl:  .  Travoprost, BAK Free, (TRAVATAN Z) 0.004 % SOLN ophthalmic solution, Apply to eye., Disp: , Rfl:  .  triamcinolone cream (KENALOG) 0.1 %, Apply 1 application topically 3 (three) times daily. To insect bites, Disp: 30 g, Rfl: 0 .  valACYclovir (VALTREX) 1000 MG tablet, Take 1,000 mg by mouth as needed. Only for outbreaks of fever blisters, Disp: , Rfl:   Review of Systems  Constitutional: Positive for fever.  HENT: Negative for congestion and sinus pain.   Respiratory: Positive for cough (mild).   Cardiovascular: Negative.  Gastrointestinal: Negative.   Musculoskeletal: Positive for myalgias.  Neurological: Positive for headaches.    Social History   Tobacco Use  . Smoking status: Former Smoker    Types: Cigars  . Smokeless tobacco: Never Used  . Tobacco comment: quit cig 2 yrs ago, social smoking only  Substance Use Topics  . Alcohol use: Yes    Comment: occasional 3 x week   Objective:   BP 120/72 (BP Location: Right Arm, Patient Position: Sitting, Cuff Size: Normal)   Pulse 83   Temp 98.2 F (36.8 C) (Oral)   Wt 202 lb 12.8 oz (92 kg)   SpO2 96%   BMI 28.28 kg/m  Vitals:   05/10/18 1415  BP: 120/72  Pulse: 83  Temp: 98.2 F (36.8 C)  TempSrc: Oral  SpO2: 96%  Weight: 202 lb  12.8 oz (92 kg)     Physical Exam  Constitutional: He is oriented to person, place, and time. He appears well-developed and well-nourished. No distress.  HENT:  Head: Normocephalic and atraumatic.  Mouth/Throat: Oropharynx is clear and moist.  Eyes: Pupils are equal, round, and reactive to light. No scleral icterus.  Neck: Neck supple. No JVD present.  Cardiovascular: Normal rate, regular rhythm, normal heart sounds and intact distal pulses.  No murmur heard. Pulmonary/Chest: Effort normal and breath sounds normal. No respiratory distress. He has no wheezes. He has no rales.  Abdominal: Soft. Bowel sounds are normal. He exhibits no distension. There is no tenderness.  Musculoskeletal: He exhibits no edema.  Lymphadenopathy:       Head (right side): No submental, no submandibular, no preauricular, no posterior auricular and no occipital adenopathy present.       Head (left side): No submental, no submandibular, no preauricular, no posterior auricular and no occipital adenopathy present.    He has no cervical adenopathy.    He has no axillary adenopathy.       Right: No supraclavicular adenopathy present.       Left: No supraclavicular adenopathy present.  Neurological: He is alert and oriented to person, place, and time. He has normal strength. No cranial nerve deficit. Gait normal.  Skin: Skin is warm and dry. Capillary refill takes less than 2 seconds. No rash noted.  Psychiatric: He has a normal mood and affect. His behavior is normal.  Vitals reviewed.      Assessment & Plan:   1. Flushing 2. Night sweats - new onset evening flushing - no signs/symptoms of any infection - no LAD - most recent PSA undetectable - UTD on cancer screenings - will check TSH, CMP, and CBC to ensure no secondary cause - suspect that cialis could be causing flushing, though he has been on this for a long time - will hold cialis for ~1 wk to see if symptoms stop - will need to consider other  treatments for ED if stopping cialis resolves symptoms - will f/u in 2 wks if it has not resolved and consider further work-up - TSH - Comprehensive metabolic panel - CBC w/Diff/Platelet    Return in about 2 weeks (around 05/24/2018) for PCP flushing f/u.   The entirety of the information documented in the History of Present Illness, Review of Systems and Physical Exam were personally obtained by me. Portions of this information were initially documented by Tiburcio Pea, CMA and reviewed by me for thoroughness and accuracy.    Virginia Crews, MD, MPH [facility] 05/10/2018 2:56 PM

## 2018-05-11 ENCOUNTER — Telehealth: Payer: Self-pay | Admitting: Family Medicine

## 2018-05-11 LAB — CBC WITH DIFFERENTIAL/PLATELET
BASOS: 1 %
Basophils Absolute: 0.1 10*3/uL (ref 0.0–0.2)
EOS (ABSOLUTE): 0.1 10*3/uL (ref 0.0–0.4)
EOS: 1 %
HEMATOCRIT: 39.8 % (ref 37.5–51.0)
Hemoglobin: 14 g/dL (ref 13.0–17.7)
IMMATURE GRANS (ABS): 0 10*3/uL (ref 0.0–0.1)
IMMATURE GRANULOCYTES: 1 %
LYMPHS: 51 %
Lymphocytes Absolute: 2.9 10*3/uL (ref 0.7–3.1)
MCH: 32.6 pg (ref 26.6–33.0)
MCHC: 35.2 g/dL (ref 31.5–35.7)
MCV: 93 fL (ref 79–97)
MONOS ABS: 0.5 10*3/uL (ref 0.1–0.9)
Monocytes: 9 %
NEUTROS PCT: 37 %
Neutrophils Absolute: 2 10*3/uL (ref 1.4–7.0)
Platelets: 149 10*3/uL — ABNORMAL LOW (ref 150–450)
RBC: 4.29 x10E6/uL (ref 4.14–5.80)
RDW: 13 % (ref 12.3–15.4)
WBC: 5.6 10*3/uL (ref 3.4–10.8)

## 2018-05-11 LAB — COMPREHENSIVE METABOLIC PANEL
ALBUMIN: 3.6 g/dL (ref 3.6–4.8)
ALT: 70 IU/L — ABNORMAL HIGH (ref 0–44)
AST: 58 IU/L — ABNORMAL HIGH (ref 0–40)
Albumin/Globulin Ratio: 1.2 (ref 1.2–2.2)
Alkaline Phosphatase: 72 IU/L (ref 39–117)
BUN/Creatinine Ratio: 21 (ref 10–24)
BUN: 17 mg/dL (ref 8–27)
Bilirubin Total: 0.3 mg/dL (ref 0.0–1.2)
CALCIUM: 9 mg/dL (ref 8.6–10.2)
CO2: 22 mmol/L (ref 20–29)
CREATININE: 0.81 mg/dL (ref 0.76–1.27)
Chloride: 103 mmol/L (ref 96–106)
GFR calc Af Amer: 105 mL/min/{1.73_m2} (ref >59)
GFR calc non Af Amer: 91 mL/min/{1.73_m2} (ref >59)
GLOBULIN, TOTAL: 2.9 g/dL (ref 1.5–4.5)
Glucose: 90 mg/dL (ref 65–99)
Potassium: 4.5 mmol/L (ref 3.5–5.2)
SODIUM: 142 mmol/L (ref 134–144)
TOTAL PROTEIN: 6.5 g/dL (ref 6.0–8.5)

## 2018-05-11 LAB — TSH: TSH: 1.78 u[IU]/mL (ref 0.450–4.500)

## 2018-05-11 NOTE — Telephone Encounter (Signed)
-----   Message from Virginia Crews, MD sent at 05/11/2018  1:59 PM EDT ----- Grossly normal labs.  Platelets are slightly low, but no cause for symptoms  Virginia Crews, MD, MPH [facility] 05/11/2018 1:59 PM

## 2018-05-11 NOTE — Telephone Encounter (Signed)
Patient advised of lab results

## 2018-05-11 NOTE — Telephone Encounter (Signed)
Pt needing a call back asap to discuss recent labs done. Pt is going out of town Fri. and wanting to know before the trip.  Thanks, [employer]

## 2018-05-17 ENCOUNTER — Ambulatory Visit
Admission: RE | Admit: 2018-05-17 | Discharge: 2018-05-17 | Disposition: A | Discharge status: Home / Self Care | Payer: Medicare Other | Source: Ambulatory Visit | Attending: Family Medicine | Admitting: Family Medicine

## 2018-05-17 ENCOUNTER — Ambulatory Visit (INDEPENDENT_AMBULATORY_CARE_PROVIDER_SITE_OTHER): Payer: Medicare Other | Admitting: Family Medicine

## 2018-05-17 ENCOUNTER — Encounter: Payer: Self-pay | Admitting: Family Medicine

## 2018-05-17 VITALS — BP 110/70 | HR 81 | Temp 98.6°F | Resp 16 | Wt 201.0 lb

## 2018-05-17 DIAGNOSIS — R05 Cough: Secondary | ICD-10-CM | POA: Diagnosis not present

## 2018-05-17 DIAGNOSIS — J40 Bronchitis, not specified as acute or chronic: Secondary | ICD-10-CM | POA: Diagnosis not present

## 2018-05-17 DIAGNOSIS — R5383 Other fatigue: Secondary | ICD-10-CM | POA: Diagnosis not present

## 2018-05-17 DIAGNOSIS — R509 Fever, unspecified: Secondary | ICD-10-CM

## 2018-05-17 DIAGNOSIS — I279 Pulmonary heart disease, unspecified: Secondary | ICD-10-CM | POA: Insufficient documentation

## 2018-05-17 MED ORDER — AZITHROMYCIN 250 MG PO TABS: ORAL_TABLET | ORAL | 0 refills | Status: DC

## 2018-05-17 NOTE — Progress Notes (Signed)
Patient: Dennis Steele Male    DOB: June 12, 1951   67 y.o.   MRN: [id number] Visit Date: 05/17/2018  Today's Provider: [name], MD   Chief Complaint  Patient presents with  . Fever   Subjective:    HPI    Patient returns to office today for follow up visit from 05/10/18. Patient complains of a fever for the past 3 weeks only in the evenings. Patient reports that his fever runs anywhere between 100-100.9. Patient reports associated with fever he has had symptoms of sweats, feeling "flushed",loss of energy and dry cough. Patient reports taken otc Tylenol PM at night to control symptoms. When patient was seen in office on 05/10/18 he states that Dr. Brita Romp wanted him to stop taking Cialis to see if his symptoms would subside, patient reports that his blood work was reported back normal.   No Known Allergies   Current Outpatient Medications:  .  aspirin 81 MG tablet, Take 1 tablet (81 mg total) by mouth daily., Disp: 30 tablet, Rfl: 12 .  azelastine (ASTELIN) 0.1 % nasal spray, Place 2 sprays into both nostrils 2 (two) times daily. Use in each nostril as directed, Disp: 30 mL, Rfl: 12 .  calcium carbonate (TUMS - DOSED IN MG ELEMENTAL CALCIUM) 500 MG chewable tablet, Chew 1 tablet by mouth daily., Disp: , Rfl:  .  lisinopril (PRINIVIL,ZESTRIL) 20 MG tablet, TAKE ONE TABLET EVERY DAY, Disp: 90 tablet, Rfl: 3 .  MULTIPLE VITAMIN PO, Take by mouth., Disp: , Rfl:  .  timolol (TIMOPTIC) 0.25 % ophthalmic solution, Apply to eye., Disp: , Rfl:  .  Travoprost, BAK Free, (TRAVATAN Z) 0.004 % SOLN ophthalmic solution, Apply to eye., Disp: , Rfl:  .  tadalafil (CIALIS) 5 MG tablet, Take by mouth., Disp: , Rfl:  .  triamcinolone cream (KENALOG) 0.1 %, Apply 1 application topically 3 (three) times daily. To insect bites (Patient not taking: Reported on 05/17/2018), Disp: 30 g, Rfl: 0 .  valACYclovir (VALTREX) 1000 MG tablet, Take 1,000 mg by mouth as needed. Only for outbreaks of  fever blisters, Disp: , Rfl:   Review of Systems  Constitutional: Positive for appetite change, chills, fatigue and fever.  HENT: Negative.   Eyes: Negative.   Respiratory: Positive for cough.   Cardiovascular: Negative.   Gastrointestinal: Negative.   Endocrine: Negative.   Genitourinary: Negative.   Musculoskeletal: Negative.   Allergic/Immunologic: Negative.   Neurological: Negative.   Hematological: Negative.   Psychiatric/Behavioral: Negative.     Social History   Tobacco Use  . Smoking status: Former Smoker    Types: Cigars  . Smokeless tobacco: Never Used  . Tobacco comment: quit cig 2 yrs ago, social smoking only  Substance Use Topics  . Alcohol use: Yes    Comment: occasional 3 x week   Objective:   BP 110/70   Pulse 81   Temp 98.6 F (37 C) (Oral)   Resp 16   Wt 201 lb (91.2 kg)   SpO2 95%   BMI 28.03 kg/m  Vitals:   05/17/18 1011  BP: 110/70  Pulse: 81  Resp: 16  Temp: 98.6 F (37 C)  TempSrc: Oral  SpO2: 95%  Weight: 201 lb (91.2 kg)     Physical Exam  Constitutional: He is oriented to person, place, and time. He appears well-developed and well-nourished.  HENT:  Head: Normocephalic and atraumatic.  Right Ear: External ear normal.  Left Ear: External ear normal.  Nose: Nose normal.  Eyes: Conjunctivae are normal. No scleral icterus.  Neck: No thyromegaly present.  Cardiovascular: Normal rate, regular rhythm, normal heart sounds and intact distal pulses.  Pulmonary/Chest: Effort normal and breath sounds normal.  Abdominal: Soft.  Musculoskeletal: He exhibits no edema.  Neurological: He is alert and oriented to person, place, and time.  Skin: Skin is warm and dry.  Psychiatric: He has a normal mood and affect. His behavior is normal. Thought content normal.        Assessment & Plan:     1. Bronchitis Possibility was Z-Pak. - CBC with Differential/Platelet - Quantiferon tb gold assay (blood) - Sed Rate (ESR) - Blood culture  (routine single) - Blood culture (routine single) - DG Chest 2 View; Future - azithromycin (ZITHROMAX) 250 MG tablet; Take as directed (Patient not taking: Reported on 05/23/2018)  Dispense: 6 tablet; Refill: 0  2. Fever, unspecified fever cause No etiology at this time.  T-max is always about 100 or below.  No significant fever.  Time rule out viral cardiomyopathy and hospital SVE 3 referral to cardiology.  Blood cultures were negative x2.  May need ID referral if this persists. - CBC with Differential/Platelet - Quantiferon tb gold assay (blood) - Sed Rate (ESR) - Blood culture (routine single) - Blood culture (routine single) - azithromycin (ZITHROMAX) 250 MG tablet; Take as directed (Patient not taking: Reported on 05/23/2018)  Dispense: 6 tablet; Refill: 0  3. Fatigue, unspecified type We will out viral cardiomyopathy with referral to cardiology.  Discussed this with patient.  For than 50% of 25 minutes visit spent in counseling and coordination of care.  I have done the exam and reviewed the chart and it is accurate to the best of my knowledge. Development worker, community has been used and  any errors in dictation or transcription are unintentional. Miguel Aschoff M.D. [facility]       Patient seen and examined by Dr. Miguel Aschoff, note scribed by Jennings Books, [place], MD  [facility]

## 2018-05-20 LAB — CBC WITH DIFFERENTIAL/PLATELET
BASOS: 2 %
Basophils Absolute: 0.2 10*3/uL (ref 0.0–0.2)
EOS (ABSOLUTE): 0 10*3/uL (ref 0.0–0.4)
EOS: 0 %
HEMATOCRIT: 43 % (ref 37.5–51.0)
HEMOGLOBIN: 14.3 g/dL (ref 13.0–17.7)
Immature Grans (Abs): 0 10*3/uL (ref 0.0–0.1)
Immature Granulocytes: 0 %
LYMPHS ABS: 5.5 10*3/uL — AB (ref 0.7–3.1)
Lymphs: 54 %
MCH: 30.8 pg (ref 26.6–33.0)
MCHC: 33.3 g/dL (ref 31.5–35.7)
MCV: 93 fL (ref 79–97)
MONOS ABS: 2.4 10*3/uL — AB (ref 0.1–0.9)
Monocytes: 23 %
Neutrophils Absolute: 2.2 10*3/uL (ref 1.4–7.0)
Neutrophils: 21 %
Platelets: 195 10*3/uL (ref 150–450)
RBC: 4.65 x10E6/uL (ref 4.14–5.80)
RDW: 13 % (ref 12.3–15.4)
WBC: 10.3 10*3/uL (ref 3.4–10.8)

## 2018-05-20 LAB — QUANTIFERON-TB GOLD PLUS
QUANTIFERON NIL VALUE: 0.29 [IU]/mL
QUANTIFERON TB1 AG VALUE: 0.28 [IU]/mL
QUANTIFERON TB2 AG VALUE: 0.25 [IU]/mL
QuantiFERON Mitogen Value: >10 IU/mL
QuantiFERON-TB Gold Plus: NEGATIVE

## 2018-05-20 LAB — SEDIMENTATION RATE: Sed Rate: 25 mm/hr (ref 0–30)

## 2018-05-23 ENCOUNTER — Encounter: Payer: Self-pay | Admitting: Family Medicine

## 2018-05-23 ENCOUNTER — Ambulatory Visit: Payer: Medicare Other | Admitting: Family Medicine

## 2018-05-23 ENCOUNTER — Ambulatory Visit (INDEPENDENT_AMBULATORY_CARE_PROVIDER_SITE_OTHER): Payer: Medicare Other | Admitting: Family Medicine

## 2018-05-23 VITALS — BP 120/76 | HR 84 | Temp 97.9°F | Resp 16 | Wt 198.0 lb

## 2018-05-23 DIAGNOSIS — R5383 Other fatigue: Secondary | ICD-10-CM

## 2018-05-23 LAB — CULTURE, BLOOD (SINGLE)

## 2018-05-23 NOTE — Progress Notes (Signed)
Patient: Dennis Steele Male    DOB: [birth year]   67 y.o.   MRN: [id number] Visit Date: 05/23/2018  Today's Provider: [name], MD   Chief Complaint  Patient presents with  . Fever   Subjective:    Fever   This is a recurrent problem. The problem occurs intermittently (Pt reports his temp increases in the evening. ). Associated symptoms include headaches (Only in the evening). Pertinent negatives include no abdominal pain, chest pain, congestion, coughing, diarrhea, ear pain, muscle aches, nausea, rash, sleepiness, sore throat, urinary pain, vomiting or wheezing. He has tried acetaminophen for the symptoms.  His biggest complaint is fatigue. He is worried about this     No Known Allergies   Current Outpatient Medications:  .  aspirin 81 MG tablet, Take 1 tablet (81 mg total) by mouth daily., Disp: 30 tablet, Rfl: 12 .  azelastine (ASTELIN) 0.1 % nasal spray, Place 2 sprays into both nostrils 2 (two) times daily. Use in each nostril as directed, Disp: 30 mL, Rfl: 12 .  calcium carbonate (TUMS - DOSED IN MG ELEMENTAL CALCIUM) 500 MG chewable tablet, Chew 1 tablet by mouth daily., Disp: , Rfl:  .  lisinopril (PRINIVIL,ZESTRIL) 20 MG tablet, TAKE ONE TABLET EVERY DAY, Disp: 90 tablet, Rfl: 3 .  MULTIPLE VITAMIN PO, Take by mouth., Disp: , Rfl:  .  tadalafil (CIALIS) 5 MG tablet, Take by mouth., Disp: , Rfl:  .  timolol (TIMOPTIC) 0.25 % ophthalmic solution, Apply to eye., Disp: , Rfl:  .  Travoprost, BAK Free, (TRAVATAN Z) 0.004 % SOLN ophthalmic solution, Apply to eye., Disp: , Rfl:  .  valACYclovir (VALTREX) 1000 MG tablet, Take 1,000 mg by mouth as needed. Only for outbreaks of fever blisters, Disp: , Rfl:  .  azithromycin (ZITHROMAX) 250 MG tablet, Take as directed (Patient not taking: Reported on 05/23/2018), Disp: 6 tablet, Rfl: 0 .  triamcinolone cream (KENALOG) 0.1 %, Apply 1 application topically 3 (three) times daily. To insect bites (Patient not taking:  Reported on 05/17/2018), Disp: 30 g, Rfl: 0  Review of Systems  Constitutional: Positive for appetite change (Pt reports a decreased appetite), diaphoresis, fatigue and fever (Temp was 99 last night). Negative for activity change, chills and unexpected weight change.  HENT: Negative for congestion, ear pain and sore throat.   Respiratory: Negative.  Negative for cough and wheezing.   Cardiovascular: Negative.  Negative for chest pain.  Gastrointestinal: Negative.  Negative for abdominal pain, diarrhea, nausea and vomiting.  Genitourinary: Negative.  Negative for dysuria.  Musculoskeletal: Negative.   Skin: Negative.  Negative for rash.  Neurological: Positive for headaches (Only in the evening). Negative for dizziness, weakness and light-headedness.  Hematological: Negative for adenopathy.    Social History   Tobacco Use  . Smoking status: Former Smoker    Types: Cigars  . Smokeless tobacco: Never Used  . Tobacco comment: quit cig 2 yrs ago, social smoking only  Substance Use Topics  . Alcohol use: Yes    Comment: occasional 3 x week   Objective:   BP 120/76 (BP Location: Right Arm, Patient Position: Sitting, Cuff Size: Normal)   Pulse 84   Temp 97.9 F (36.6 C) (Oral)   Resp 16   Wt 198 lb (89.8 kg)   BMI 27.62 kg/m  Vitals:   05/23/18 1014  BP: 120/76  Pulse: 84  Resp: 16  Temp: 97.9 F (36.6 C)  TempSrc: Oral  Weight: 198  lb (89.8 kg)     Physical Exam  Constitutional: He is oriented to person, place, and time. He appears well-developed and well-nourished.  HENT:  Head: Normocephalic and atraumatic.  Right Ear: External ear normal.  Left Ear: External ear normal.  Eyes: Conjunctivae are normal. No scleral icterus.  Neck: No thyromegaly present.  Cardiovascular: Normal rate, regular rhythm and normal heart sounds.  Pulmonary/Chest: Effort normal.  Abdominal: Soft.  Musculoskeletal: He exhibits no edema.  Neurological: He is alert and oriented to person,  place, and time.  Skin: Skin is warm and dry.  Psychiatric: He has a normal mood and affect. His behavior is normal. Judgment and thought content normal.        Assessment & Plan:     1. Fatigue, unspecified type Pt says it feels as though he has fever at night. No temperature readings.from home .Afebrile here R/o SBE/cardiomyopathy. May need ID referral  I have done the exam and reviewed the chart and it is accurate to the best of my knowledge. Development worker, community has been used and  any errors in dictation or transcription are unintentional. Miguel Aschoff M.D. [facility]   - Ambulatory referral to Cardiology       [name], MD  [facility]

## 2018-05-24 DIAGNOSIS — I10 Essential (primary) hypertension: Secondary | ICD-10-CM | POA: Diagnosis not present

## 2018-05-24 DIAGNOSIS — I208 Other forms of angina pectoris: Secondary | ICD-10-CM | POA: Diagnosis not present

## 2018-05-29 DIAGNOSIS — R2689 Other abnormalities of gait and mobility: Secondary | ICD-10-CM | POA: Diagnosis not present

## 2018-05-29 DIAGNOSIS — M6281 Muscle weakness (generalized): Secondary | ICD-10-CM | POA: Diagnosis not present

## 2018-06-07 DIAGNOSIS — M47817 Spondylosis without myelopathy or radiculopathy, lumbosacral region: Secondary | ICD-10-CM | POA: Diagnosis not present

## 2018-06-07 DIAGNOSIS — Z981 Arthrodesis status: Secondary | ICD-10-CM | POA: Diagnosis not present

## 2018-06-08 DIAGNOSIS — H401431 Capsular glaucoma with pseudoexfoliation of lens, bilateral, mild stage: Secondary | ICD-10-CM | POA: Diagnosis not present

## 2018-06-09 ENCOUNTER — Telehealth: Payer: Self-pay

## 2018-06-09 DIAGNOSIS — I208 Other forms of angina pectoris: Secondary | ICD-10-CM | POA: Diagnosis not present

## 2018-06-09 NOTE — Telephone Encounter (Signed)
Called pt to schedule an AWV prior to his CPE on 07/06/18 and pt declined. Pt states he does not want to complete an AWV only a CPE. FYI to PCP! -MM

## 2018-06-15 DIAGNOSIS — E782 Mixed hyperlipidemia: Secondary | ICD-10-CM | POA: Diagnosis not present

## 2018-06-15 DIAGNOSIS — R0602 Shortness of breath: Secondary | ICD-10-CM | POA: Diagnosis not present

## 2018-06-15 DIAGNOSIS — I10 Essential (primary) hypertension: Secondary | ICD-10-CM | POA: Diagnosis not present

## 2018-06-27 DIAGNOSIS — M5416 Radiculopathy, lumbar region: Secondary | ICD-10-CM | POA: Diagnosis not present

## 2018-06-27 DIAGNOSIS — M9901 Segmental and somatic dysfunction of cervical region: Secondary | ICD-10-CM | POA: Diagnosis not present

## 2018-06-27 DIAGNOSIS — M5033 Other cervical disc degeneration, cervicothoracic region: Secondary | ICD-10-CM | POA: Diagnosis not present

## 2018-06-27 DIAGNOSIS — M9903 Segmental and somatic dysfunction of lumbar region: Secondary | ICD-10-CM | POA: Diagnosis not present

## 2018-07-03 DIAGNOSIS — S56414A Strain of extensor muscle, fascia and tendon of left middle finger at forearm level, initial encounter: Secondary | ICD-10-CM | POA: Diagnosis not present

## 2018-07-06 ENCOUNTER — Ambulatory Visit (INDEPENDENT_AMBULATORY_CARE_PROVIDER_SITE_OTHER): Payer: Medicare Other | Admitting: Family Medicine

## 2018-07-06 VITALS — BP 135/88 | HR 71 | Temp 98.6°F | Resp 16 | Ht 71.0 in | Wt 200.0 lb

## 2018-07-06 DIAGNOSIS — M150 Primary generalized (osteo)arthritis: Secondary | ICD-10-CM

## 2018-07-06 DIAGNOSIS — C61 Malignant neoplasm of prostate: Secondary | ICD-10-CM

## 2018-07-06 DIAGNOSIS — M5416 Radiculopathy, lumbar region: Secondary | ICD-10-CM | POA: Diagnosis not present

## 2018-07-06 DIAGNOSIS — M159 Polyosteoarthritis, unspecified: Secondary | ICD-10-CM

## 2018-07-06 DIAGNOSIS — E7800 Pure hypercholesterolemia, unspecified: Secondary | ICD-10-CM | POA: Diagnosis not present

## 2018-07-06 DIAGNOSIS — I10 Essential (primary) hypertension: Secondary | ICD-10-CM

## 2018-07-06 NOTE — Progress Notes (Signed)
Dennis Steele  MRN: [id number] DOB: 02/23/1951  Subjective:  HPI   The patient is a 67 year old male who presents for follow up of chronic health.  He was last seen on 05/23/18 for fatigue.  He has no complaints.  Patient Active Problem List   Diagnosis Date Noted  . Rotator cuff tear 04/15/2016  . Prostate cancer ([place]) 06/03/2015  . Benign fibroma of prostate 05/26/2015  . Internal hemorrhoids 05/26/2015  . Personal history of urinary calculi 05/26/2015  . H/O meningitis 05/26/2015  . BP (high blood pressure) 05/26/2015  . Lumbar radiculopathy 05/26/2015  . Arthritis, degenerative 05/26/2015  . Hyperlipidemia 05/26/2015  . Open-angle glaucoma 08/09/2013  . Lumbar canal stenosis 11/24/2012  . Degeneration of intervertebral disc of lumbosacral region 11/24/2012    Past Medical History:  Diagnosis Date  . Cancer [facility])    prostate  . GERD (gastroesophageal reflux disease)   . Hypertension   . OAG (open angle glaucoma)     Social History   Socioeconomic History  . Marital status: Married    Spouse name: Not on file  . Number of children: Not on file  . Years of education: Not on file  . Highest education level: Not on file  Occupational History  . Not on file  Social Needs  . Financial resource strain: Not on file  . Food insecurity:    Worry: Not on file    Inability: Not on file  . Transportation needs:    Medical: Not on file    Non-medical: Not on file  Tobacco Use  . Smoking status: Former Smoker    Types: Cigars  . Smokeless tobacco: Never Used  . Tobacco comment: quit cig 2 yrs ago, social smoking only  Substance and Sexual Activity  . Alcohol use: Yes    Comment: occasional 3 x week  . Drug use: No  . Sexual activity: Yes  Lifestyle  . Physical activity:    Days per week: Not on file    Minutes per session: Not on file  . Stress: Not on file  Relationships  . Social connections:    Talks on phone: Not on file    Gets together: Not on file      Attends religious service: Not on file    Active member of club or organization: Not on file    Attends meetings of clubs or organizations: Not on file    Relationship status: Not on file  . Intimate partner violence:    Fear of current or ex partner: Not on file    Emotionally abused: Not on file    Physically abused: Not on file    Forced sexual activity: Not on file  Other Topics Concern  . Not on file  Social History Narrative  . Not on file    Outpatient Encounter Medications as of 07/06/2018  Medication Sig Note  . azelastine (ASTELIN) 0.1 % nasal spray Place 2 sprays into both nostrils 2 (two) times daily. Use in each nostril as directed 05/10/2018: PRN  . calcium carbonate (TUMS - DOSED IN MG ELEMENTAL CALCIUM) 500 MG chewable tablet Chew 1 tablet by mouth daily.   [name] Kitchen lisinopril (PRINIVIL,ZESTRIL) 20 MG tablet TAKE ONE TABLET EVERY DAY   . meloxicam (MOBIC) 15 MG tablet Take 15 mg by mouth daily.   . MULTIPLE VITAMIN PO Take by mouth. 06/03/2015: Received from: [facility]  . tadalafil (CIALIS) 5 MG tablet Take by mouth. 06/03/2015: Received  from: [facility]  . timolol (TIMOPTIC) 0.25 % ophthalmic solution Apply to eye. 06/03/2015: Received from: [facility]  . Travoprost, BAK Free, (TRAVATAN Z) 0.004 % SOLN ophthalmic solution Apply to eye. 06/03/2015: Received from: [facility]  . triamcinolone cream (KENALOG) 0.1 % Apply 1 application topically 3 (three) times daily. To insect bites   . valACYclovir (VALTREX) 1000 MG tablet Take 1,000 mg by mouth as needed. Only for outbreaks of fever blisters   . [DISCONTINUED] aspirin 81 MG tablet Take 1 tablet (81 mg total) by mouth daily. (Patient not taking: Reported on 07/06/2018)   . [DISCONTINUED] azithromycin (ZITHROMAX) 250 MG tablet Take as directed (Patient not taking: Reported on 05/23/2018)    No facility-administered encounter medications on file as of  07/06/2018.     No Known Allergies  Review of Systems  Constitutional: Negative.   HENT: Negative.   Eyes: Negative.   Respiratory: Negative.   Cardiovascular: Negative.   Gastrointestinal: Negative.   Genitourinary: Negative.   Musculoskeletal: Negative.   Skin: Negative.   Neurological: Negative.   Endo/Heme/Allergies: Negative.   Psychiatric/Behavioral: Negative.     Objective:  BP 135/88 (BP Location: Right Arm, Patient Position: Sitting, Cuff Size: Large)   Pulse 71   Temp 98.6 F (37 C) (Oral)   Resp 16   Ht 5\' 11"  (1.803 m)   Wt 200 lb (90.7 kg)   BMI 27.89 kg/m   Fall Risk  07/06/2018 06/30/2017 06/08/2016 06/03/2015  Falls in the past year? 0 No No No   Depression screen [facility] 2/9 07/06/2018 06/30/2017 06/08/2016 06/03/2015  Decreased Interest 0 - 0 0  Down, Depressed, Hopeless 0 0 0 0  PHQ - 2 Score 0 0 0 0   Functional Status Survey: Is the patient deaf or have difficulty hearing?: No Does the patient have difficulty seeing, even when wearing glasses/contacts?: No Does the patient have difficulty concentrating, remembering, or making decisions?: No Does the patient have difficulty walking or climbing stairs?: No Does the patient have difficulty dressing or bathing?: No Does the patient have difficulty doing errands alone such as visiting a doctor's office or shopping?: No   Office Visit from 07/06/2018 in [place]  AUDIT-C Score  4      Physical Exam  Constitutional: He is oriented to person, place, and time and well-developed, well-nourished, and in no distress.  HENT:  Head: Normocephalic and atraumatic.  Right Ear: External ear normal.  Left Ear: External ear normal.  Nose: Nose normal.  Eyes: Conjunctivae are normal. No scleral icterus.  Neck: No thyromegaly present.  Cardiovascular: Normal rate, regular rhythm and normal heart sounds.  Pulmonary/Chest: Effort normal and breath sounds normal.  Abdominal: Soft.  Musculoskeletal:         General: No edema.  Neurological: He is alert and oriented to person, place, and time. Gait normal. GCS score is 15.  Skin: Skin is warm and dry.  Psychiatric: Mood, memory, affect and judgment normal.    Assessment and Plan :   1. Essential hypertension  - CBC with Differential/Platelet - Comprehensive metabolic panel - TSH  2. Prostate cancer (HCC)  - PSA  3. Pure hypercholesterolemia  - Lipid Panel With LDL/HDL Ratio 4.h/o Prostate cancer The sweats the patient complained of a month or 2 ago that basically resolved.  Is much better.  HPI, Exam and A&P Transcribed under the direction and in the presence of [name]., MD. Electronically Signed: Althea Charon, RMA I  have done the exam and reviewed the chart and it is accurate to the best of my knowledge. Development worker, community has been used and  any errors in dictation or transcription are unintentional. Miguel Aschoff M.D. [facility]

## 2018-07-07 ENCOUNTER — Telehealth: Payer: Self-pay

## 2018-07-07 LAB — COMPREHENSIVE METABOLIC PANEL
ALBUMIN: 4 g/dL (ref 3.6–4.8)
ALT: 23 IU/L (ref 0–44)
AST: 27 IU/L (ref 0–40)
Albumin/Globulin Ratio: 1.4 (ref 1.2–2.2)
Alkaline Phosphatase: 63 IU/L (ref 39–117)
BUN/Creatinine Ratio: 28 — ABNORMAL HIGH (ref 10–24)
BUN: 20 mg/dL (ref 8–27)
Bilirubin Total: 0.3 mg/dL (ref 0.0–1.2)
CALCIUM: 9.1 mg/dL (ref 8.6–10.2)
CO2: 20 mmol/L (ref 20–29)
CREATININE: 0.72 mg/dL — AB (ref 0.76–1.27)
Chloride: 105 mmol/L (ref 96–106)
GFR calc Af Amer: 110 mL/min/{1.73_m2} (ref >59)
GFR, EST NON AFRICAN AMERICAN: 95 mL/min/{1.73_m2} (ref >59)
GLUCOSE: 95 mg/dL (ref 65–99)
Globulin, Total: 2.8 g/dL (ref 1.5–4.5)
Potassium: 4.4 mmol/L (ref 3.5–5.2)
SODIUM: 140 mmol/L (ref 134–144)
Total Protein: 6.8 g/dL (ref 6.0–8.5)

## 2018-07-07 LAB — CBC WITH DIFFERENTIAL/PLATELET
Basophils Absolute: 0.1 10*3/uL (ref 0.0–0.2)
Basos: 1 %
EOS (ABSOLUTE): 0.1 10*3/uL (ref 0.0–0.4)
Eos: 3 %
HEMATOCRIT: 38.7 % (ref 37.5–51.0)
HEMOGLOBIN: 13.3 g/dL (ref 13.0–17.7)
IMMATURE GRANS (ABS): 0 10*3/uL (ref 0.0–0.1)
IMMATURE GRANULOCYTES: 0 %
LYMPHS: 50 %
Lymphocytes Absolute: 2.3 10*3/uL (ref 0.7–3.1)
MCH: 31.7 pg (ref 26.6–33.0)
MCHC: 34.4 g/dL (ref 31.5–35.7)
MCV: 92 fL (ref 79–97)
MONOCYTES: 15 %
Monocytes Absolute: 0.7 10*3/uL (ref 0.1–0.9)
NEUTROS PCT: 31 %
Neutrophils Absolute: 1.5 10*3/uL (ref 1.4–7.0)
Platelets: 165 10*3/uL (ref 150–450)
RBC: 4.19 x10E6/uL (ref 4.14–5.80)
RDW: 14.2 % (ref 12.3–15.4)
WBC: 4.7 10*3/uL (ref 3.4–10.8)

## 2018-07-07 LAB — PSA: Prostate Specific Ag, Serum: <0.1 ng/mL (ref 0.0–4.0)

## 2018-07-07 LAB — LIPID PANEL WITH LDL/HDL RATIO
Cholesterol, Total: 200 mg/dL — ABNORMAL HIGH (ref 100–199)
HDL: 40 mg/dL (ref >39)
LDL CALC: 127 mg/dL — AB (ref 0–99)
LDL/HDL RATIO: 3.2 ratio (ref 0.0–3.6)
TRIGLYCERIDES: 164 mg/dL — AB (ref 0–149)
VLDL CHOLESTEROL CAL: 33 mg/dL (ref 5–40)

## 2018-07-07 LAB — TSH: TSH: 1.68 u[IU]/mL (ref 0.450–4.500)

## 2018-07-07 NOTE — Telephone Encounter (Signed)
Pt advised.   Thanks,   -Laura  

## 2018-07-07 NOTE — Telephone Encounter (Signed)
-----   Message from Jerrol Banana., MD sent at 07/07/2018  8:55 AM EST ----- Labs stable

## 2018-07-28 ENCOUNTER — Encounter: Payer: Self-pay | Admitting: Physician Assistant

## 2018-07-28 ENCOUNTER — Ambulatory Visit (INDEPENDENT_AMBULATORY_CARE_PROVIDER_SITE_OTHER): Payer: Medicare Other | Admitting: Physician Assistant

## 2018-07-28 VITALS — BP 117/82 | HR 81 | Temp 98.1°F | Wt 195.8 lb

## 2018-07-28 DIAGNOSIS — Z20828 Contact with and (suspected) exposure to other viral communicable diseases: Secondary | ICD-10-CM | POA: Diagnosis not present

## 2018-07-28 DIAGNOSIS — B9789 Other viral agents as the cause of diseases classified elsewhere: Secondary | ICD-10-CM

## 2018-07-28 DIAGNOSIS — J069 Acute upper respiratory infection, unspecified: Secondary | ICD-10-CM

## 2018-07-28 LAB — POCT INFLUENZA A/B
Influenza A, POC: NEGATIVE
Influenza B, POC: NEGATIVE

## 2018-07-28 MED ORDER — HYDROCODONE-HOMATROPINE 5-1.5 MG/5ML PO SYRP: 5.0000 mL | ORAL_SOLUTION | Freq: Three times a day (TID) | ORAL | 0 refills | Status: DC | PRN

## 2018-07-28 NOTE — Progress Notes (Signed)
Patient: Dennis Steele Male    DOB: 1951/06/14   68 y.o.   MRN: [id number] Visit Date: 07/28/2018  Today's Provider: Trinna Post, PA-C   Chief Complaint  Patient presents with  . Sinus Problem   Subjective:   Symptoms began 07/25/2018.  Sinus Problem  The current episode started in the past 7 days. The problem is unchanged. There has been no fever. He is experiencing no pain. Associated symptoms include congestion, coughing, headaches, sinus pressure and sneezing. Past treatments include oral decongestants, saline sprays and acetaminophen. The treatment provided mild relief.      No Known Allergies   Current Outpatient Medications:  .  azelastine (ASTELIN) 0.1 % nasal spray, Place 2 sprays into both nostrils 2 (two) times daily. Use in each nostril as directed, Disp: 30 mL, Rfl: 12 .  calcium carbonate (TUMS - DOSED IN MG ELEMENTAL CALCIUM) 500 MG chewable tablet, Chew 1 tablet by mouth daily., Disp: , Rfl:  .  lisinopril (PRINIVIL,ZESTRIL) 20 MG tablet, TAKE ONE TABLET EVERY DAY, Disp: 90 tablet, Rfl: 3 .  meloxicam (MOBIC) 15 MG tablet, Take 15 mg by mouth daily., Disp: , Rfl:  .  MULTIPLE VITAMIN PO, Take by mouth., Disp: , Rfl:  .  tadalafil (CIALIS) 5 MG tablet, Take by mouth., Disp: , Rfl:  .  timolol (TIMOPTIC) 0.25 % ophthalmic solution, Apply to eye., Disp: , Rfl:  .  Travoprost, BAK Free, (TRAVATAN Z) 0.004 % SOLN ophthalmic solution, Apply to eye., Disp: , Rfl:  .  triamcinolone cream (KENALOG) 0.1 %, Apply 1 application topically 3 (three) times daily. To insect bites, Disp: 30 g, Rfl: 0 .  valACYclovir (VALTREX) 1000 MG tablet, Take 1,000 mg by mouth as needed. Only for outbreaks of fever blisters, Disp: , Rfl:   Review of Systems  Constitutional: Negative.   HENT: Positive for congestion, sinus pressure and sneezing.   Respiratory: Positive for cough.   Cardiovascular: Negative.   Musculoskeletal: Negative.   Neurological: Positive for headaches.     Social History   Tobacco Use  . Smoking status: Former Smoker    Types: Cigars  . Smokeless tobacco: Never Used  . Tobacco comment: quit cig 2 yrs ago, social smoking only  Substance Use Topics  . Alcohol use: Yes    Comment: occasional 3 x week      Objective:   BP 117/82 (BP Location: Left Arm, Patient Position: Sitting, Cuff Size: Normal)   Pulse 81   Temp 98.1 F (36.7 C) (Oral)   Wt 195 lb 12.8 oz (88.8 kg)   SpO2 97%   BMI 27.31 kg/m  Vitals:   07/28/18 1144  BP: 117/82  Pulse: 81  Temp: 98.1 F (36.7 C)  TempSrc: Oral  SpO2: 97%  Weight: 195 lb 12.8 oz (88.8 kg)     Physical Exam Constitutional:      General: He is not in acute distress.    Appearance: He is well-developed. He is not diaphoretic.  HENT:     Right Ear: Tympanic membrane and external ear normal.     Left Ear: Tympanic membrane and external ear normal.     Nose: Rhinorrhea present.     Right Sinus: No maxillary sinus tenderness or frontal sinus tenderness.     Left Sinus: No maxillary sinus tenderness or frontal sinus tenderness.     Mouth/Throat:     Pharynx: Uvula midline. No oropharyngeal exudate.  Eyes:  General:        Right eye: Discharge present.        Left eye: Discharge present.    Conjunctiva/sclera: Conjunctivae normal.     Comments: Watery Discharge   Neck:     Musculoskeletal: Normal range of motion and neck supple.  Cardiovascular:     Rate and Rhythm: Normal rate and regular rhythm.  Pulmonary:     Effort: Pulmonary effort is normal. No respiratory distress.     Breath sounds: Normal breath sounds. No wheezing or rales.  Lymphadenopathy:     Cervical: No cervical adenopathy.  Skin:    General: Skin is warm and dry.  Neurological:     Mental Status: He is alert and oriented to person, place, and time.  Psychiatric:        Behavior: Behavior normal.         Assessment & Plan    1. Exposure to influenza  Rapid flu negative.   - POCT Influenza  A/B  2. Viral URI with cough  Counseled on symptomatic treatments including cough supressant, Mucinex and coricidin. May call back after ten days if not feeling better for antibiotic.  - HYDROcodone-homatropine (HYCODAN) 5-1.5 MG/5ML syrup; Take 5 mLs by mouth every 8 (eight) hours as needed for cough.  Dispense: 120 mL; Refill: 0  No follow-ups on file.  The entirety of the information documented in the History of Present Illness, Review of Systems and Physical Exam were personally obtained by me. Portions of this information were initially documented by Hurman Horn, CMA and reviewed by me for thoroughness and accuracy.         Trinna Post, PA-C  [facility]

## 2018-07-28 NOTE — Patient Instructions (Addendum)
Coricidin over the counter decongestant Mucinex Plain Can use flonase with all of this    Sinusitis, Adult Sinusitis is soreness and swelling (inflammation) of your sinuses. Sinuses are hollow spaces in the bones around your face. They are located:  Around your eyes.  In the middle of your forehead.  Behind your nose.  In your cheekbones. Your sinuses and nasal passages are lined with a fluid called mucus. Mucus drains out of your sinuses. Swelling can trap mucus in your sinuses. This lets germs (bacteria, virus, or fungus) grow, which leads to infection. Most of the time, this condition is caused by a virus. What are the causes? This condition is caused by:  Allergies.  Asthma.  Germs.  Things that block your nose or sinuses.  Growths in the nose (nasal polyps).  Chemicals or irritants in the air.  Fungus (rare). What increases the risk? You are more likely to develop this condition if:  You have a weak body defense system (immune system).  You do a lot of swimming or diving.  You use nasal sprays too much.  You smoke. What are the signs or symptoms? The main symptoms of this condition are pain and a feeling of pressure around the sinuses. Other symptoms include:  Stuffy nose (congestion).  Runny nose (drainage).  Swelling and warmth in the sinuses.  Headache.  Toothache.  A cough that may get worse at night.  Mucus that collects in the throat or the back of the nose (postnasal drip).  Being unable to smell and taste.  Being very tired (fatigue).  A fever.  Sore throat.  Bad breath. How is this diagnosed? This condition is diagnosed based on:  Your symptoms.  Your medical history.  A physical exam.  Tests to find out if your condition is short-term (acute) or long-term (chronic). Your doctor may: ? Check your nose for growths (polyps). ? Check your sinuses using a tool that has a light (endoscope). ? Check for allergies or germs. ? Do  imaging tests, such as an MRI or CT scan. How is this treated? Treatment for this condition depends on the cause and whether it is short-term or long-term.  If caused by a virus, your symptoms should go away on their own within 10 days. You may be given medicines to relieve symptoms. They include: ? Medicines that shrink swollen tissue in the nose. ? Medicines that treat allergies (antihistamines). ? A spray that treats swelling of the nostrils. ? Rinses that help get rid of thick mucus in your nose (nasal saline washes).  If caused by bacteria, your doctor may wait to see if you will get better without treatment. You may be given antibiotic medicine if you have: ? A very bad infection. ? A weak body defense system.  If caused by growths in the nose, you may need to have surgery. Follow these instructions at home: Medicines  Take, use, or apply over-the-counter and prescription medicines only as told by your doctor. These may include nasal sprays.  If you were prescribed an antibiotic medicine, take it as told by your doctor. Do not stop taking the antibiotic even if you start to feel better. Hydrate and humidify   Drink enough water to keep your pee (urine) pale yellow.  Use a cool mist humidifier to keep the humidity level in your home above 50%.  Breathe in steam for 10-15 minutes, 3-4 times a day, or as told by your doctor. You can do this in the bathroom  while a hot shower is running.  Try not to spend time in cool or dry air. Rest  Rest as much as you can.  Sleep with your head raised (elevated).  Make sure you get enough sleep each night. General instructions   Put a warm, moist washcloth on your face 3-4 times a day, or as often as told by your doctor. This will help with discomfort.  Wash your hands often with soap and water. If there is no soap and water, use hand sanitizer.  Do not smoke. Avoid being around people who are smoking (secondhand smoke).  Keep  all follow-up visits as told by your doctor. This is important. Contact a doctor if:  You have a fever.  Your symptoms get worse.  Your symptoms do not get better within 10 days. Get help right away if:  You have a very bad headache.  You cannot stop throwing up (vomiting).  You have very bad pain or swelling around your face or eyes.  You have trouble seeing.  You feel confused.  Your neck is stiff.  You have trouble breathing. Summary  Sinusitis is swelling of your sinuses. Sinuses are hollow spaces in the bones around your face.  This condition is caused by tissues in your nose that become inflamed or swollen. This traps germs. These can lead to infection.  If you were prescribed an antibiotic medicine, take it as told by your doctor. Do not stop taking it even if you start to feel better.  Keep all follow-up visits as told by your doctor. This is important. This information is not intended to replace advice given to you by your health care provider. Make sure you discuss any questions you have with your health care provider. Document Released: 12/29/2007 Document Revised: 12/12/2017 Document Reviewed: 12/12/2017 Elsevier Interactive Patient Education  2019 [employer].

## 2018-08-01 DIAGNOSIS — M9903 Segmental and somatic dysfunction of lumbar region: Secondary | ICD-10-CM | POA: Diagnosis not present

## 2018-08-01 DIAGNOSIS — M9901 Segmental and somatic dysfunction of cervical region: Secondary | ICD-10-CM | POA: Diagnosis not present

## 2018-08-01 DIAGNOSIS — M5033 Other cervical disc degeneration, cervicothoracic region: Secondary | ICD-10-CM | POA: Diagnosis not present

## 2018-08-01 DIAGNOSIS — M5416 Radiculopathy, lumbar region: Secondary | ICD-10-CM | POA: Diagnosis not present

## 2018-08-24 DIAGNOSIS — D485 Neoplasm of uncertain behavior of skin: Secondary | ICD-10-CM | POA: Diagnosis not present

## 2018-08-24 DIAGNOSIS — L821 Other seborrheic keratosis: Secondary | ICD-10-CM | POA: Diagnosis not present

## 2018-08-24 DIAGNOSIS — L820 Inflamed seborrheic keratosis: Secondary | ICD-10-CM | POA: Diagnosis not present

## 2018-08-24 DIAGNOSIS — Z1283 Encounter for screening for malignant neoplasm of skin: Secondary | ICD-10-CM | POA: Diagnosis not present

## 2018-08-24 DIAGNOSIS — L578 Other skin changes due to chronic exposure to nonionizing radiation: Secondary | ICD-10-CM | POA: Diagnosis not present

## 2018-08-24 DIAGNOSIS — D1800 Hemangioma unspecified site: Secondary | ICD-10-CM | POA: Diagnosis not present

## 2018-08-24 DIAGNOSIS — L400 Psoriasis vulgaris: Secondary | ICD-10-CM | POA: Diagnosis not present

## 2018-08-24 DIAGNOSIS — L719 Rosacea, unspecified: Secondary | ICD-10-CM | POA: Diagnosis not present

## 2018-08-24 DIAGNOSIS — L812 Freckles: Secondary | ICD-10-CM | POA: Diagnosis not present

## 2018-08-24 DIAGNOSIS — Z85828 Personal history of other malignant neoplasm of skin: Secondary | ICD-10-CM | POA: Diagnosis not present

## 2018-08-24 DIAGNOSIS — D229 Melanocytic nevi, unspecified: Secondary | ICD-10-CM | POA: Diagnosis not present

## 2018-08-24 DIAGNOSIS — D225 Melanocytic nevi of trunk: Secondary | ICD-10-CM | POA: Diagnosis not present

## 2018-08-29 DIAGNOSIS — M5416 Radiculopathy, lumbar region: Secondary | ICD-10-CM | POA: Diagnosis not present

## 2018-08-29 DIAGNOSIS — M9901 Segmental and somatic dysfunction of cervical region: Secondary | ICD-10-CM | POA: Diagnosis not present

## 2018-08-29 DIAGNOSIS — M5033 Other cervical disc degeneration, cervicothoracic region: Secondary | ICD-10-CM | POA: Diagnosis not present

## 2018-08-29 DIAGNOSIS — M9903 Segmental and somatic dysfunction of lumbar region: Secondary | ICD-10-CM | POA: Diagnosis not present

## 2018-09-07 ENCOUNTER — Ambulatory Visit
Admission: RE | Admit: 2018-09-07 | Discharge: 2018-09-07 | Disposition: A | Discharge status: Home / Self Care | Payer: Medicare Other | Attending: Family Medicine | Admitting: Family Medicine

## 2018-09-07 ENCOUNTER — Ambulatory Visit
Admission: RE | Admit: 2018-09-07 | Discharge: 2018-09-07 | Disposition: A | Discharge status: Home / Self Care | Payer: Medicare Other | Source: Ambulatory Visit | Attending: Family Medicine | Admitting: Family Medicine

## 2018-09-07 ENCOUNTER — Encounter: Payer: Self-pay | Admitting: Family Medicine

## 2018-09-07 ENCOUNTER — Ambulatory Visit (INDEPENDENT_AMBULATORY_CARE_PROVIDER_SITE_OTHER): Payer: Medicare Other | Admitting: Family Medicine

## 2018-09-07 VITALS — BP 132/72 | HR 78 | Temp 98.9°F | Resp 20 | Wt 204.0 lb

## 2018-09-07 DIAGNOSIS — R059 Cough, unspecified: Secondary | ICD-10-CM

## 2018-09-07 DIAGNOSIS — R05 Cough: Secondary | ICD-10-CM

## 2018-09-07 DIAGNOSIS — J324 Chronic pansinusitis: Secondary | ICD-10-CM

## 2018-09-07 MED ORDER — DOXYCYCLINE HYCLATE 100 MG PO TABS: 100.0000 mg | ORAL_TABLET | Freq: Two times a day (BID) | ORAL | 0 refills | Status: AC

## 2018-09-07 NOTE — Patient Instructions (Signed)
Robitussin cough syrup at bedtime.

## 2018-09-07 NOTE — Progress Notes (Signed)
Patient: Dennis Steele Male    DOB: 1950-09-17   68 y.o.   MRN: [id number] Visit Date: 09/07/2018  Today's Provider: [name], MD   Chief Complaint  Patient presents with  . Cough   Subjective:     Cough  This is a new problem. The current episode started 1 to 4 weeks ago (4 weeks). The problem has been unchanged. The cough is productive of sputum. Pertinent negatives include no chills, fever or shortness of breath. There is no history of environmental allergies.   Patient is also taking Coridcidn at bedtime with no relief. Recent travel to [place] in January.  No Known Allergies   Current Outpatient Medications:  .  azelastine (ASTELIN) 0.1 % nasal spray, Place 2 sprays into both nostrils 2 (two) times daily. Use in each nostril as directed, Disp: 30 mL, Rfl: 12 .  calcium carbonate (TUMS - DOSED IN MG ELEMENTAL CALCIUM) 500 MG chewable tablet, Chew 1 tablet by mouth daily., Disp: , Rfl:  .  HYDROcodone-homatropine (HYCODAN) 5-1.5 MG/5ML syrup, Take 5 mLs by mouth every 8 (eight) hours as needed for cough., Disp: 120 mL, Rfl: 0 .  lisinopril (PRINIVIL,ZESTRIL) 20 MG tablet, TAKE ONE TABLET EVERY DAY, Disp: 90 tablet, Rfl: 3 .  MULTIPLE VITAMIN PO, Take by mouth., Disp: , Rfl:  .  tadalafil (CIALIS) 5 MG tablet, Take by mouth., Disp: , Rfl:  .  timolol (TIMOPTIC) 0.25 % ophthalmic solution, Apply to eye., Disp: , Rfl:  .  Travoprost, BAK Free, (TRAVATAN Z) 0.004 % SOLN ophthalmic solution, Apply to eye., Disp: , Rfl:  .  triamcinolone cream (KENALOG) 0.1 %, Apply 1 application topically 3 (three) times daily. To insect bites, Disp: 30 g, Rfl: 0 .  valACYclovir (VALTREX) 1000 MG tablet, Take 1,000 mg by mouth as needed. Only for outbreaks of fever blisters, Disp: , Rfl:  .  meloxicam (MOBIC) 15 MG tablet, Take 15 mg by mouth daily., Disp: , Rfl:   Review of Systems  Constitutional: Negative for activity change, appetite change, chills, diaphoresis, fatigue, fever  and unexpected weight change.  HENT: Negative.   Respiratory: Positive for cough. Negative for shortness of breath.   Cardiovascular: Negative.   Gastrointestinal: Negative.   Endocrine: Negative.   Allergic/Immunologic: Negative for environmental allergies.  Psychiatric/Behavioral: Negative.     Social History   Tobacco Use  . Smoking status: Former Smoker    Types: Cigars  . Smokeless tobacco: Never Used  . Tobacco comment: quit cig 2 yrs ago, social smoking only  Substance Use Topics  . Alcohol use: Yes    Comment: occasional 3 x week      Objective:   BP 132/72 (BP Location: Left Arm, Patient Position: Sitting, Cuff Size: Normal)   Pulse 78   Temp 98.9 F (37.2 C)   Resp 20   Wt 204 lb (92.5 kg)   SpO2 97%   BMI 28.45 kg/m  Vitals:   09/07/18 1351  BP: 132/72  Pulse: 78  Resp: 20  Temp: 98.9 F (37.2 C)  SpO2: 97%  Weight: 204 lb (92.5 kg)     Physical Exam Vitals signs reviewed.  Constitutional:      General: He is not in acute distress.    Appearance: He is well-developed and normal weight. He is not ill-appearing.  HENT:     Head: Normocephalic and atraumatic.     Right Ear: Tympanic membrane and external ear normal.  Left Ear: Tympanic membrane and external ear normal.     Nose: Nose normal.     Mouth/Throat:     Pharynx: Oropharynx is clear.  Eyes:     General: No scleral icterus.    Conjunctiva/sclera: Conjunctivae normal.  Neck:     Thyroid: No thyromegaly.  Cardiovascular:     Rate and Rhythm: Normal rate and regular rhythm.     Heart sounds: Normal heart sounds.  Pulmonary:     Effort: Pulmonary effort is normal.     Breath sounds: Normal breath sounds.  Abdominal:     Palpations: Abdomen is soft.  Skin:    General: Skin is warm and dry.  Neurological:     General: No focal deficit present.     Mental Status: He is alert and oriented to person, place, and time. Mental status is at baseline.  Psychiatric:        Mood and  Affect: Mood normal.        Behavior: Behavior normal.        Thought Content: Thought content normal.        Judgment: Judgment normal.         Assessment & Plan    1. Cough R/o walking pneumonia. - DG Chest 2 View; Future - doxycycline (VIBRA-TABS) 100 MG tablet; Take 1 tablet (100 mg total) by mouth 2 (two) times daily for 7 days.  Dispense: 14 tablet; Refill: 0  2. Chronic pansinusitis Doxy.    I have done the exam and reviewed the above chart and it is accurate to the best of my knowledge. Development worker, community has been used in this note in any air is in the dictation or transcription are unintentional.  [name], MD  [facility]

## 2018-09-12 DIAGNOSIS — H401431 Capsular glaucoma with pseudoexfoliation of lens, bilateral, mild stage: Secondary | ICD-10-CM | POA: Diagnosis not present

## 2018-10-05 ENCOUNTER — Ambulatory Visit: Payer: Self-pay | Admitting: Family Medicine

## 2018-10-09 DIAGNOSIS — Z8546 Personal history of malignant neoplasm of prostate: Secondary | ICD-10-CM | POA: Diagnosis not present

## 2018-10-10 DIAGNOSIS — M9901 Segmental and somatic dysfunction of cervical region: Secondary | ICD-10-CM | POA: Diagnosis not present

## 2018-10-10 DIAGNOSIS — M9903 Segmental and somatic dysfunction of lumbar region: Secondary | ICD-10-CM | POA: Diagnosis not present

## 2018-10-10 DIAGNOSIS — M5033 Other cervical disc degeneration, cervicothoracic region: Secondary | ICD-10-CM | POA: Diagnosis not present

## 2018-10-10 DIAGNOSIS — M5416 Radiculopathy, lumbar region: Secondary | ICD-10-CM | POA: Diagnosis not present

## 2018-10-11 DIAGNOSIS — N5201 Erectile dysfunction due to arterial insufficiency: Secondary | ICD-10-CM | POA: Diagnosis not present

## 2018-10-11 DIAGNOSIS — Z8546 Personal history of malignant neoplasm of prostate: Secondary | ICD-10-CM | POA: Diagnosis not present

## 2018-11-23 IMAGING — CR DG CHEST 2V
1 series · 2 of 2 positions shown · non-contrast
Comparison: None.

CLINICAL DATA: 67-year-old male with a history of dry cough and
fever

EXAM:
CHEST - 2 VIEW

[Series 1: dg chest 2 view · 0.14mm/px · 2 of 2 slices shown]
[im 1/2]
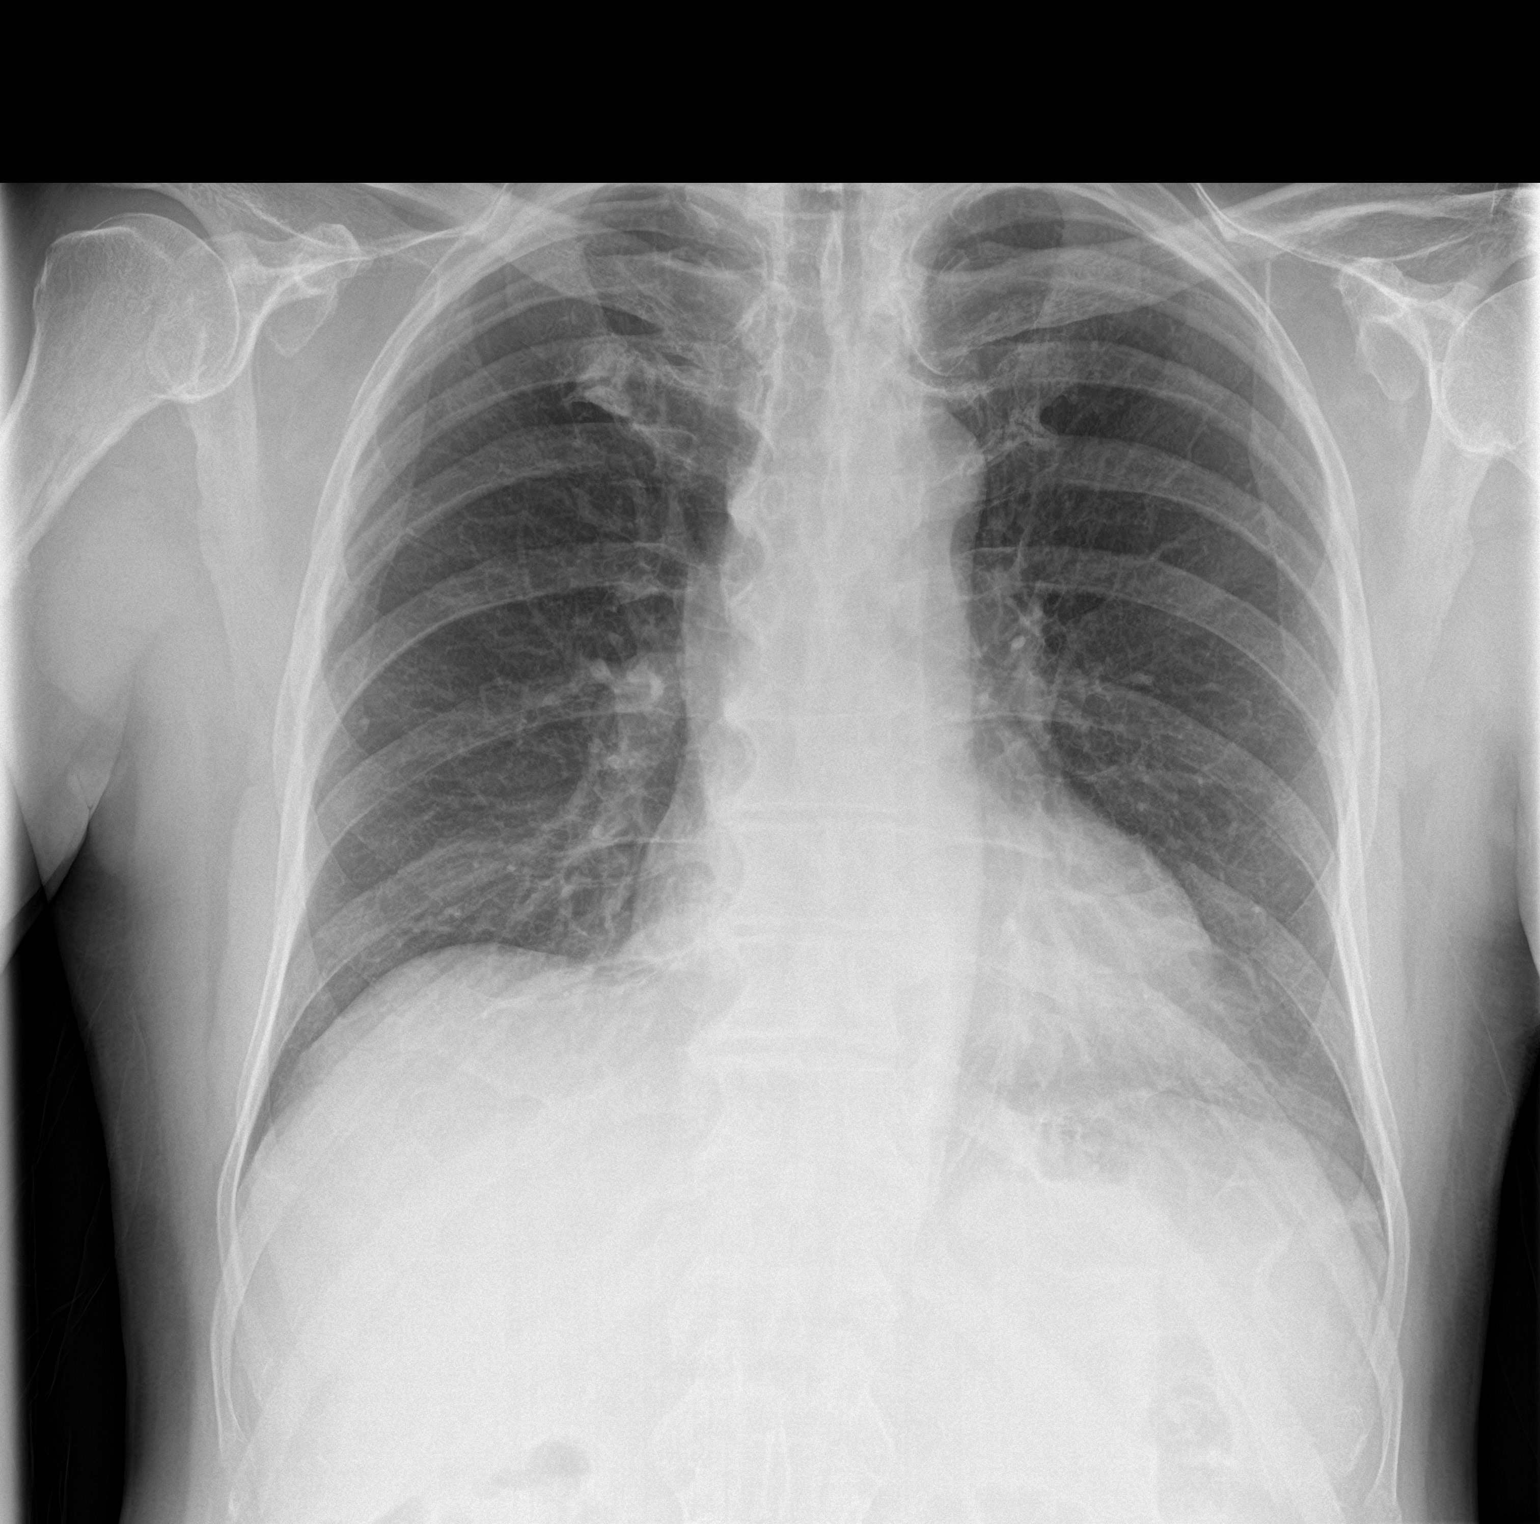
[im 2/2]
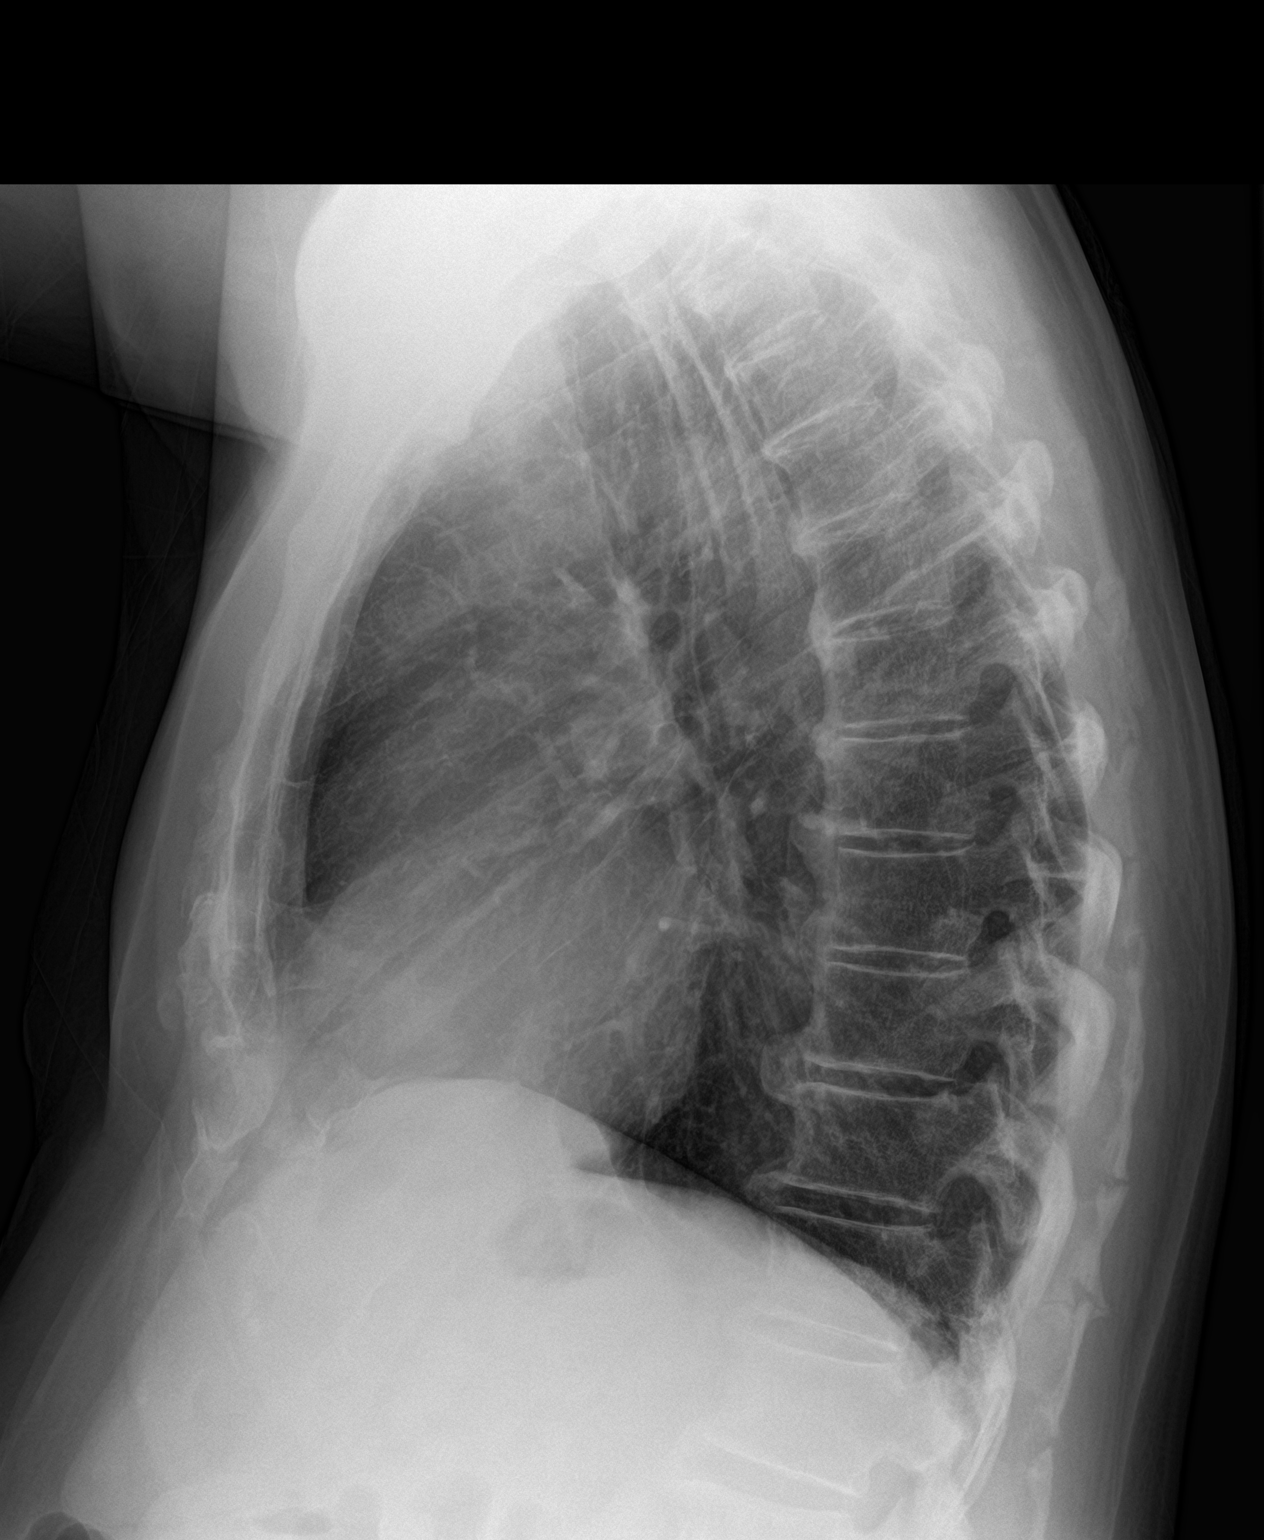

[2 of 2 positions shown; findings below may reference images not displayed]

FINDINGS: Cardiomediastinal silhouette unchanged in size and contour. No
evidence of central vascular congestion. No pneumothorax. No pleural
effusion. Coarsened interstitial markings of the bilateral lungs. No
new confluent airspace disease. Degenerative changes of the spine.
No displaced fracture.
IMPRESSION: Chronic lung changes without evidence of acute cardiopulmonary
disease.

## 2018-12-13 DIAGNOSIS — L304 Erythema intertrigo: Secondary | ICD-10-CM | POA: Diagnosis not present

## 2018-12-13 DIAGNOSIS — R21 Rash and other nonspecific skin eruption: Secondary | ICD-10-CM | POA: Diagnosis not present

## 2018-12-13 DIAGNOSIS — L237 Allergic contact dermatitis due to plants, except food: Secondary | ICD-10-CM | POA: Diagnosis not present

## 2018-12-13 DIAGNOSIS — L408 Other psoriasis: Secondary | ICD-10-CM | POA: Diagnosis not present

## 2018-12-26 DIAGNOSIS — M9901 Segmental and somatic dysfunction of cervical region: Secondary | ICD-10-CM | POA: Diagnosis not present

## 2018-12-26 DIAGNOSIS — M5033 Other cervical disc degeneration, cervicothoracic region: Secondary | ICD-10-CM | POA: Diagnosis not present

## 2018-12-26 DIAGNOSIS — M9903 Segmental and somatic dysfunction of lumbar region: Secondary | ICD-10-CM | POA: Diagnosis not present

## 2018-12-26 DIAGNOSIS — M5416 Radiculopathy, lumbar region: Secondary | ICD-10-CM | POA: Diagnosis not present

## 2019-01-30 DIAGNOSIS — M5416 Radiculopathy, lumbar region: Secondary | ICD-10-CM | POA: Diagnosis not present

## 2019-01-30 DIAGNOSIS — M5033 Other cervical disc degeneration, cervicothoracic region: Secondary | ICD-10-CM | POA: Diagnosis not present

## 2019-01-30 DIAGNOSIS — M9901 Segmental and somatic dysfunction of cervical region: Secondary | ICD-10-CM | POA: Diagnosis not present

## 2019-01-30 DIAGNOSIS — M9903 Segmental and somatic dysfunction of lumbar region: Secondary | ICD-10-CM | POA: Diagnosis not present

## 2019-02-27 DIAGNOSIS — M5416 Radiculopathy, lumbar region: Secondary | ICD-10-CM | POA: Diagnosis not present

## 2019-02-27 DIAGNOSIS — M9903 Segmental and somatic dysfunction of lumbar region: Secondary | ICD-10-CM | POA: Diagnosis not present

## 2019-02-27 DIAGNOSIS — M9901 Segmental and somatic dysfunction of cervical region: Secondary | ICD-10-CM | POA: Diagnosis not present

## 2019-02-27 DIAGNOSIS — M5033 Other cervical disc degeneration, cervicothoracic region: Secondary | ICD-10-CM | POA: Diagnosis not present

## 2019-03-05 ENCOUNTER — Other Ambulatory Visit: Payer: Self-pay | Admitting: Family Medicine

## 2019-03-05 DIAGNOSIS — I10 Essential (primary) hypertension: Secondary | ICD-10-CM

## 2019-03-13 ENCOUNTER — Encounter: Payer: Self-pay | Admitting: Surgery

## 2019-03-13 ENCOUNTER — Other Ambulatory Visit: Payer: Self-pay

## 2019-03-13 ENCOUNTER — Ambulatory Visit (INDEPENDENT_AMBULATORY_CARE_PROVIDER_SITE_OTHER): Payer: Medicare Other | Admitting: Surgery

## 2019-03-13 VITALS — BP 135/95 | HR 71 | Temp 97.7°F | Ht 71.0 in | Wt 204.4 lb

## 2019-03-13 DIAGNOSIS — K644 Residual hemorrhoidal skin tags: Secondary | ICD-10-CM

## 2019-03-13 NOTE — Patient Instructions (Addendum)
The patient is aware to call back for any questions or new concerns.  Avoid constipation and diarrhea Avoid sitting on the toilet for lengthy periods of time  Sitz baths are helpful as needed  Preparation H cream or Tucks pads as needed for comfort  Hemorrhoids Hemorrhoids are swollen veins in and around the rectum or anus. There are two types of hemorrhoids:  Internal hemorrhoids. These occur in the veins that are just inside the rectum. They may poke through to the outside and become irritated and painful.  External hemorrhoids. These occur in the veins that are outside the anus and can be felt as a painful swelling or hard lump near the anus. Most hemorrhoids do not cause serious problems, and they can be managed with home treatments such as diet and lifestyle changes. If home treatments do not help the symptoms, procedures can be done to shrink or remove the hemorrhoids. What are the causes? This condition is caused by increased pressure in the anal area. This pressure may result from various things, including:  Constipation.  Straining to have a bowel movement.  Diarrhea.  Pregnancy.  Obesity.  Sitting for long periods of time.  Heavy lifting or other activity that causes you to strain.  Anal sex.  Riding a bike for a long period of time. What are the signs or symptoms? Symptoms of this condition include:  Pain.  Anal itching or irritation.  Rectal bleeding.  Leakage of stool (feces).  Anal swelling.  One or more lumps around the anus. How is this diagnosed? This condition can often be diagnosed through a visual exam. Other exams or tests may also be done, such as:  An exam that involves feeling the rectal area with a gloved hand (digital rectal exam).  An exam of the anal canal that is done using a small tube (anoscope).  A blood test, if you have lost a significant amount of blood.  A test to look inside the colon using a flexible tube with a camera on  the end (sigmoidoscopy or colonoscopy). How is this treated? This condition can usually be treated at home. However, various procedures may be done if dietary changes, lifestyle changes, and other home treatments do not help your symptoms. These procedures can help make the hemorrhoids smaller or remove them completely. Some of these procedures involve surgery, and others do not. Common procedures include:  Rubber band ligation. Rubber bands are placed at the base of the hemorrhoids to cut off their blood supply.  Sclerotherapy. Medicine is injected into the hemorrhoids to shrink them.  Infrared coagulation. A type of light energy is used to get rid of the hemorrhoids.  Hemorrhoidectomy surgery. The hemorrhoids are surgically removed, and the veins that supply them are tied off.  Stapled hemorrhoidopexy surgery. The surgeon staples the base of the hemorrhoid to the rectal wall. Follow these instructions at home: Eating and drinking   Eat foods that have a lot of fiber in them, such as whole grains, beans, nuts, fruits, and vegetables.  Ask your health care provider about taking products that have added fiber (fiber supplements).  Reduce the amount of fat in your diet. You can do this by eating low-fat dairy products, eating less red meat, and avoiding processed foods.  Drink enough fluid to keep your urine pale yellow. Managing pain and swelling   Take warm sitz baths for 20 minutes, 3-4 times a day to ease pain and discomfort. You may do this in a bathtub or  using a portable sitz bath that fits over the toilet.  If directed, apply ice to the affected area. Using ice packs between sitz baths may be helpful. ? Put ice in a plastic bag. ? Place a towel between your skin and the bag. ? Leave the ice on for 20 minutes, 2-3 times a day. General instructions  Take over-the-counter and prescription medicines only as told by your health care provider.  Use medicated creams or  suppositories as told.  Get regular exercise. Ask your health care provider how much and what kind of exercise is best for you. In general, you should do moderate exercise for at least 30 minutes on most days of the week (150 minutes each week). This can include activities such as walking, biking, or yoga.  Go to the bathroom when you have the urge to have a bowel movement. Do not wait.  Avoid straining to have bowel movements.  Keep the anal area dry and clean. Use wet toilet paper or moist towelettes after a bowel movement.  Do not sit on the toilet for long periods of time. This increases blood pooling and pain.  Keep all follow-up visits as told by your health care provider. This is important. Contact a health care provider if you have:  Increasing pain and swelling that are not controlled by treatment or medicine.  Difficulty having a bowel movement, or you are unable to have a bowel movement.  Pain or inflammation outside the area of the hemorrhoids. Get help right away if you have:  Uncontrolled bleeding from your rectum. Summary  Hemorrhoids are swollen veins in and around the rectum or anus.  Most hemorrhoids can be managed with home treatments such as diet and lifestyle changes.  Taking warm sitz baths can help ease pain and discomfort.  In severe cases, procedures or surgery can be done to shrink or remove the hemorrhoids. This information is not intended to replace advice given to you by your health care provider. Make sure you discuss any questions you have with your health care provider. Document Released: 07/09/2000 Document Revised: 07/20/2018 Document Reviewed: 12/01/2017 Elsevier Patient Education  2020 [employer].

## 2019-03-15 ENCOUNTER — Encounter: Payer: Self-pay | Admitting: Surgery

## 2019-03-15 NOTE — Progress Notes (Signed)
03/15/2019  Reason for Visit:  External hemorrhoids  History of Present Illness: Dennis Steele is a 68 y.o. male presenting for evaluation of a mass in the perianal area.  Patient reports he noticed this about 3-4 weeks ago.  Denies any pain or blood in the stools.  He feels the mass is small but always there.  Does not change with bowel movements. No pain with bowel movements.  No straining or hard stools or constipation.  He reports the main symptoms are that he feels a mass and there is itching.  Has prior history of hemorrhoids, but unclear if internal or external but reports that Dr. Bary Castilla did a procedure for it many years ago (cannot find records).  He presents today because he wants to make sure there is nothing worrisome about this.  Past Medical History: Past Medical History:  Diagnosis Date  . Cancer [facility])    prostate  . GERD (gastroesophageal reflux disease)   . Hypertension   . OAG (open angle glaucoma)      Past Surgical History: Past Surgical History:  Procedure Laterality Date  . ANTERIOR CRUCIATE LIGAMENT REPAIR  25 yrs ago   not sure which side  . COLONOSCOPY WITH PROPOFOL N/A 10/04/2016   Procedure: COLONOSCOPY WITH PROPOFOL;  Surgeon: Manya Silvas, MD;  Location: [facility] ENDOSCOPY;  Service: Endoscopy;  Laterality: N/A;  . ESOPHAGOGASTRODUODENOSCOPY (EGD) WITH PROPOFOL N/A 10/04/2016   Procedure: ESOPHAGOGASTRODUODENOSCOPY (EGD) WITH PROPOFOL;  Surgeon: Manya Silvas, MD;  Location: [facility] ENDOSCOPY;  Service: Endoscopy;  Laterality: N/A;  . EYE SURGERY  04/2015   lens surgery from a dislodged les from cataract surgery  . LYMPHADENECTOMY  06/19/2012   Procedure: LYMPHADENECTOMY;  Surgeon: Dutch Gray, MD;  Location: [facility] ORS;  Service: Urology;  Laterality: Bilateral;  . ROBOT ASSISTED LAPAROSCOPIC RADICAL PROSTATECTOMY  06/19/2012   Procedure: ROBOTIC ASSISTED LAPAROSCOPIC RADICAL PROSTATECTOMY LEVEL 2;  Surgeon: Dutch Gray, MD;  Location: [facility] ORS;  Service: Urology;   Laterality: N/A;  . TOTAL HIP ARTHROPLASTY Right 2015    Home Medications: Prior to Admission medications   Medication Sig Start Date End Date Taking? Authorizing Provider  azelastine (ASTELIN) 0.1 % nasal spray Place 2 sprays into both nostrils 2 (two) times daily. Use in each nostril as directed 11/22/17  Yes Pollak, Adriana M, PA-C  calcium carbonate (TUMS - DOSED IN MG ELEMENTAL CALCIUM) 500 MG chewable tablet Chew 1 tablet by mouth daily.   Yes [provider]  lisinopril (ZESTRIL) 20 MG tablet TAKE ONE TABLET BY MOUTH EVERY DAY 03/05/19  Yes Chrismon, Simona Huh E, PA  MULTIPLE VITAMIN PO Take by mouth. 11/03/10  Yes [provider]  tadalafil (CIALIS) 5 MG tablet Take by mouth.   Yes [provider]  timolol (TIMOPTIC) 0.25 % ophthalmic solution Apply to eye.   Yes [provider]  Travoprost, BAK Free, (TRAVATAN Z) 0.004 % SOLN ophthalmic solution Apply to eye. 11/03/10  Yes [provider]  triamcinolone cream (KENALOG) 0.1 % Apply 1 application topically 3 (three) times daily. To insect bites 02/16/16  Yes Chauvin, Herbie [place], PA  Turmeric (QC TUMERIC COMPLEX PO) Take by mouth.   Yes [provider]  valACYclovir (VALTREX) 1000 MG tablet Take 1,000 mg by mouth as needed. Only for outbreaks of fever blisters   Yes [provider]    Allergies: No Known Allergies  Social History:  reports that he has quit smoking. His smoking use included cigars. He has never used smokeless tobacco.  He reports current alcohol use. He reports that he does not use drugs.   Family History: Family History  Problem Relation Age of Onset  . Diabetes Mother   . Cancer Mother        breast  . Stroke Mother   . Heart disease Mother   . Cancer Father        stomach, prostate- pt not complete  . Cancer Brother        esophagus    Review of Systems: Review of Systems  Constitutional: Negative for chills and fever.  Respiratory: Negative for  shortness of breath.   Cardiovascular: Negative for chest pain.  Gastrointestinal: Negative for blood in stool, constipation, diarrhea, nausea and vomiting.  Genitourinary: Negative for dysuria.  Musculoskeletal: Negative for myalgias.  Skin: Positive for itching (in the perianal area).  Neurological: Negative for dizziness.  Psychiatric/Behavioral: Negative for depression.    Physical Exam BP (!) 135/95   Pulse 71   Temp 97.7 F (36.5 C) (Temporal)   Ht 5\' 11"  (3.474 m)   Wt 204 lb 6.4 oz (92.7 kg)   SpO2 97%   BMI 28.51 kg/m  CONSTITUTIONAL: No acute distress HEENT:  Normocephalic, atraumatic, extraocular motion intact. NECK: Trachea is midline, and there is no jugular venous distension.  RESPIRATORY:  Lungs are clear, and breath sounds are equal bilaterally. Normal respiratory effort without pathologic use of accessory muscles. CARDIOVASCULAR: Heart is regular without murmurs, gallops, or rubs. GI: The abdomen is soft, non-tender, non-distended.  RECTAL:  External exam reveals an enlarged right posterior external hemorrhoid.  There is no inflammation, tenderness, erythema, or evidence of thrombosis.  The other two columns externally are normal size.  No perianal abscess, fissures, or other lesions.  Digital rectal exam does not reveal any enlarged internal hemorrhoids and there is no gross blood on the glove. MUSCULOSKELETAL:  Normal muscle strength and tone in all four extremities.  No peripheral edema or cyanosis. SKIN: Skin turgor is normal. There are no pathologic skin lesions.  NEUROLOGIC:  Motor and sensation is grossly normal.  Cranial nerves are grossly intact. PSYCH:  Alert and oriented to person, place and time. Affect is normal.  Laboratory Analysis: No results found for this or any previous visit (from the past 24 hour(s)).  Imaging: No results found.  Assessment and Plan: This is a 68 y.o. male with an enlarged external hemorrhoid  Discussed with the patient  that the external hemorrhoid is not inflamed or thrombosed and does not need any procedures at this point.  Discussed that one of the symptoms of enlarged external hemorrhoidal tissue is pruritus and that we could potentially excise the tissue for relief.  Discussed that this procedure could be painful given the innervation of the external columns.  The patient does not desire any surgery.  He feels the itching is not severe and can deal with it.  He is relieved that there is no worrisome lesion.  Discussed the role of fiber and MiraLax to maintain soft daily bowel movements.  He reports he already has soft bowel movements once or twice a day.  Discussed the role for Sitz baths, bowel habits on the toilet bowl, and medications as needed during flareups including Anusol, lidocaine, Tucks pads, or preparation H.    He can follow up as needed and we are certainly available if he changes his mind or if the pruritus becomes more bothersome.  Face-to-face time spent with the patient and care providers was 30 minutes, with  more than 50% of the time spent counseling, educating, and coordinating care of the patient.     Melvyn Neth, [facility]

## 2019-03-16 IMAGING — CR DG CHEST 2V
1 series · 2 of 2 positions shown · non-contrast
Comparison: 05/17/2018

CLINICAL DATA: Productive cough for several weeks

EXAM:
CHEST - 2 VIEW

[Series 1: dg chest 2 view · 0.14mm/px · 2 of 2 slices shown]
[im 1/2]
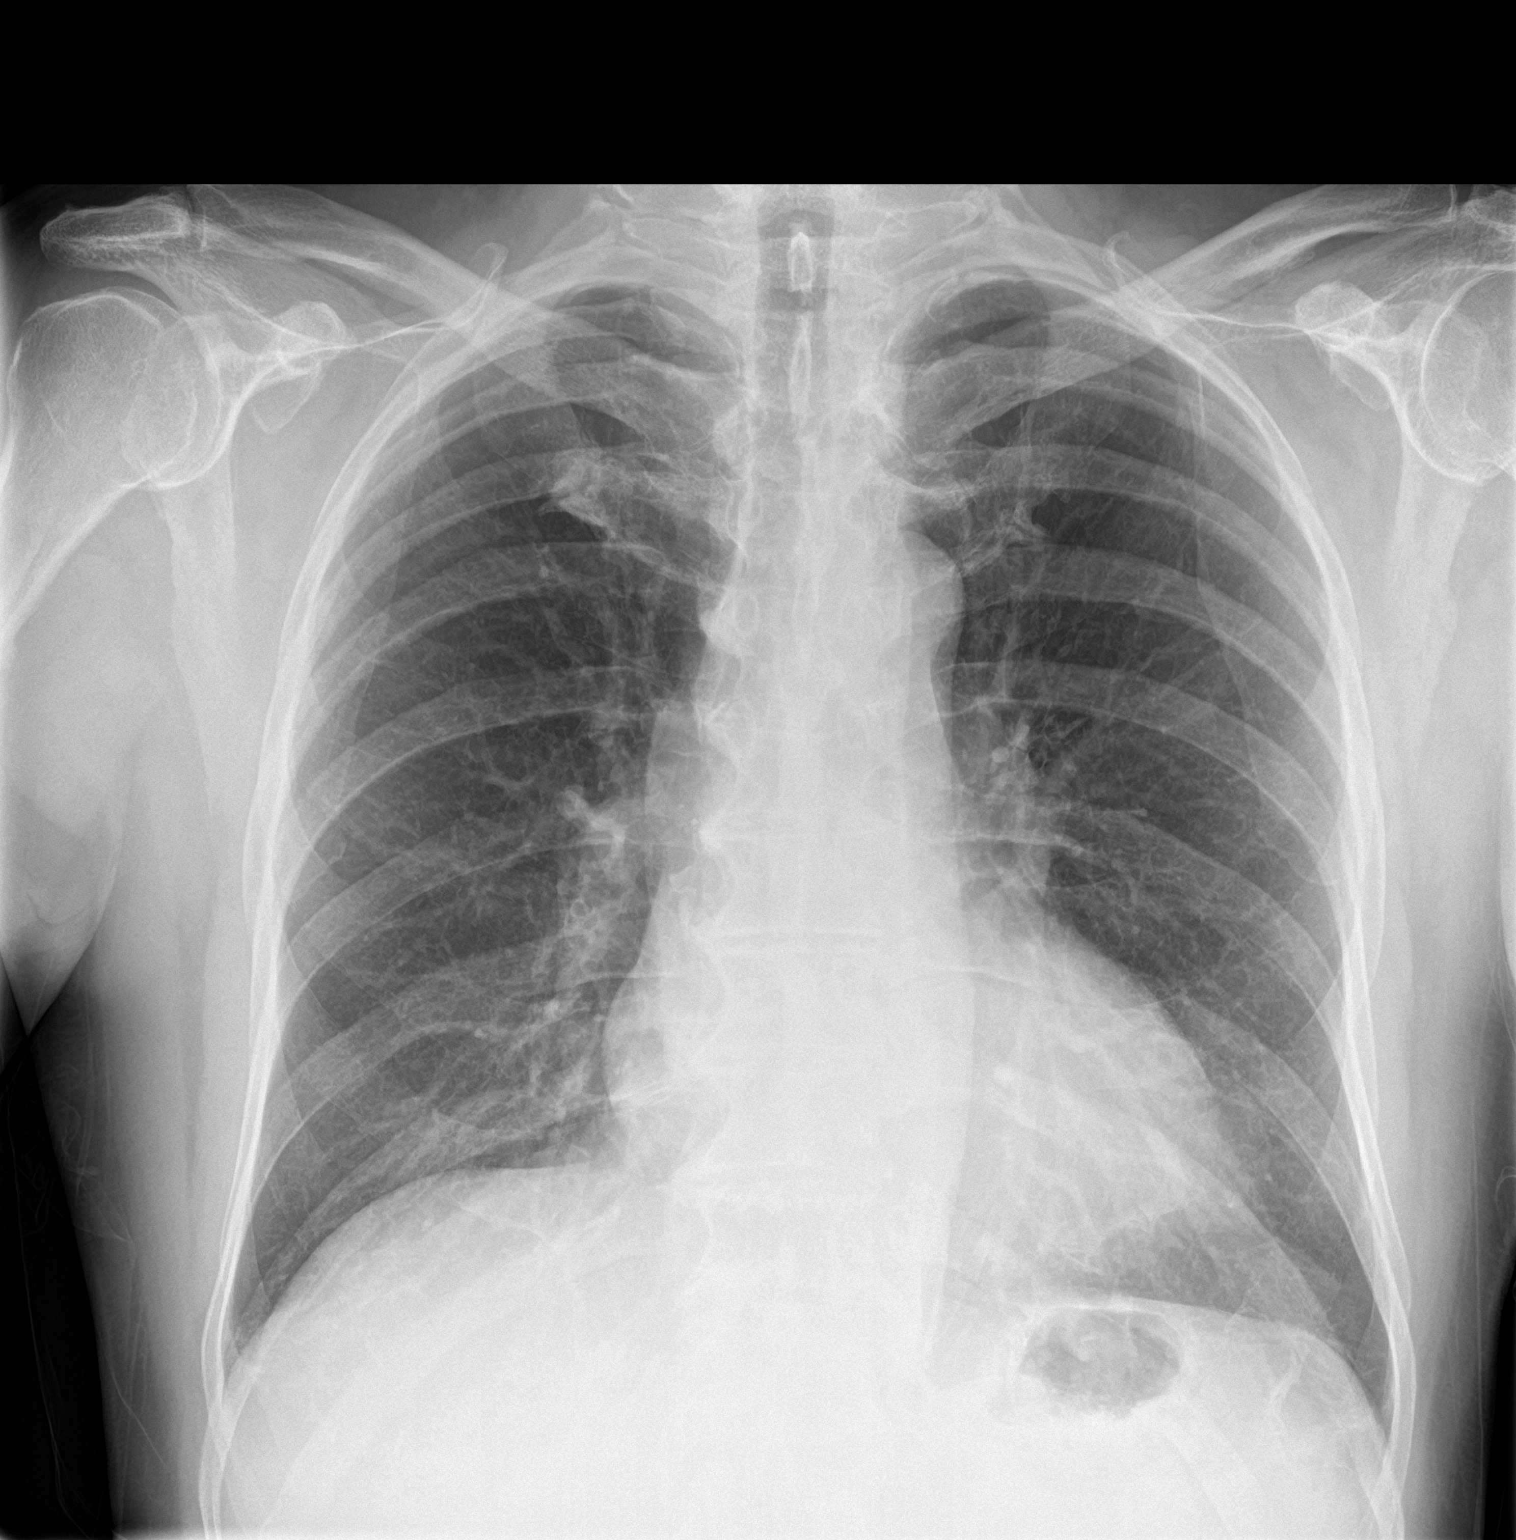
[im 2/2]
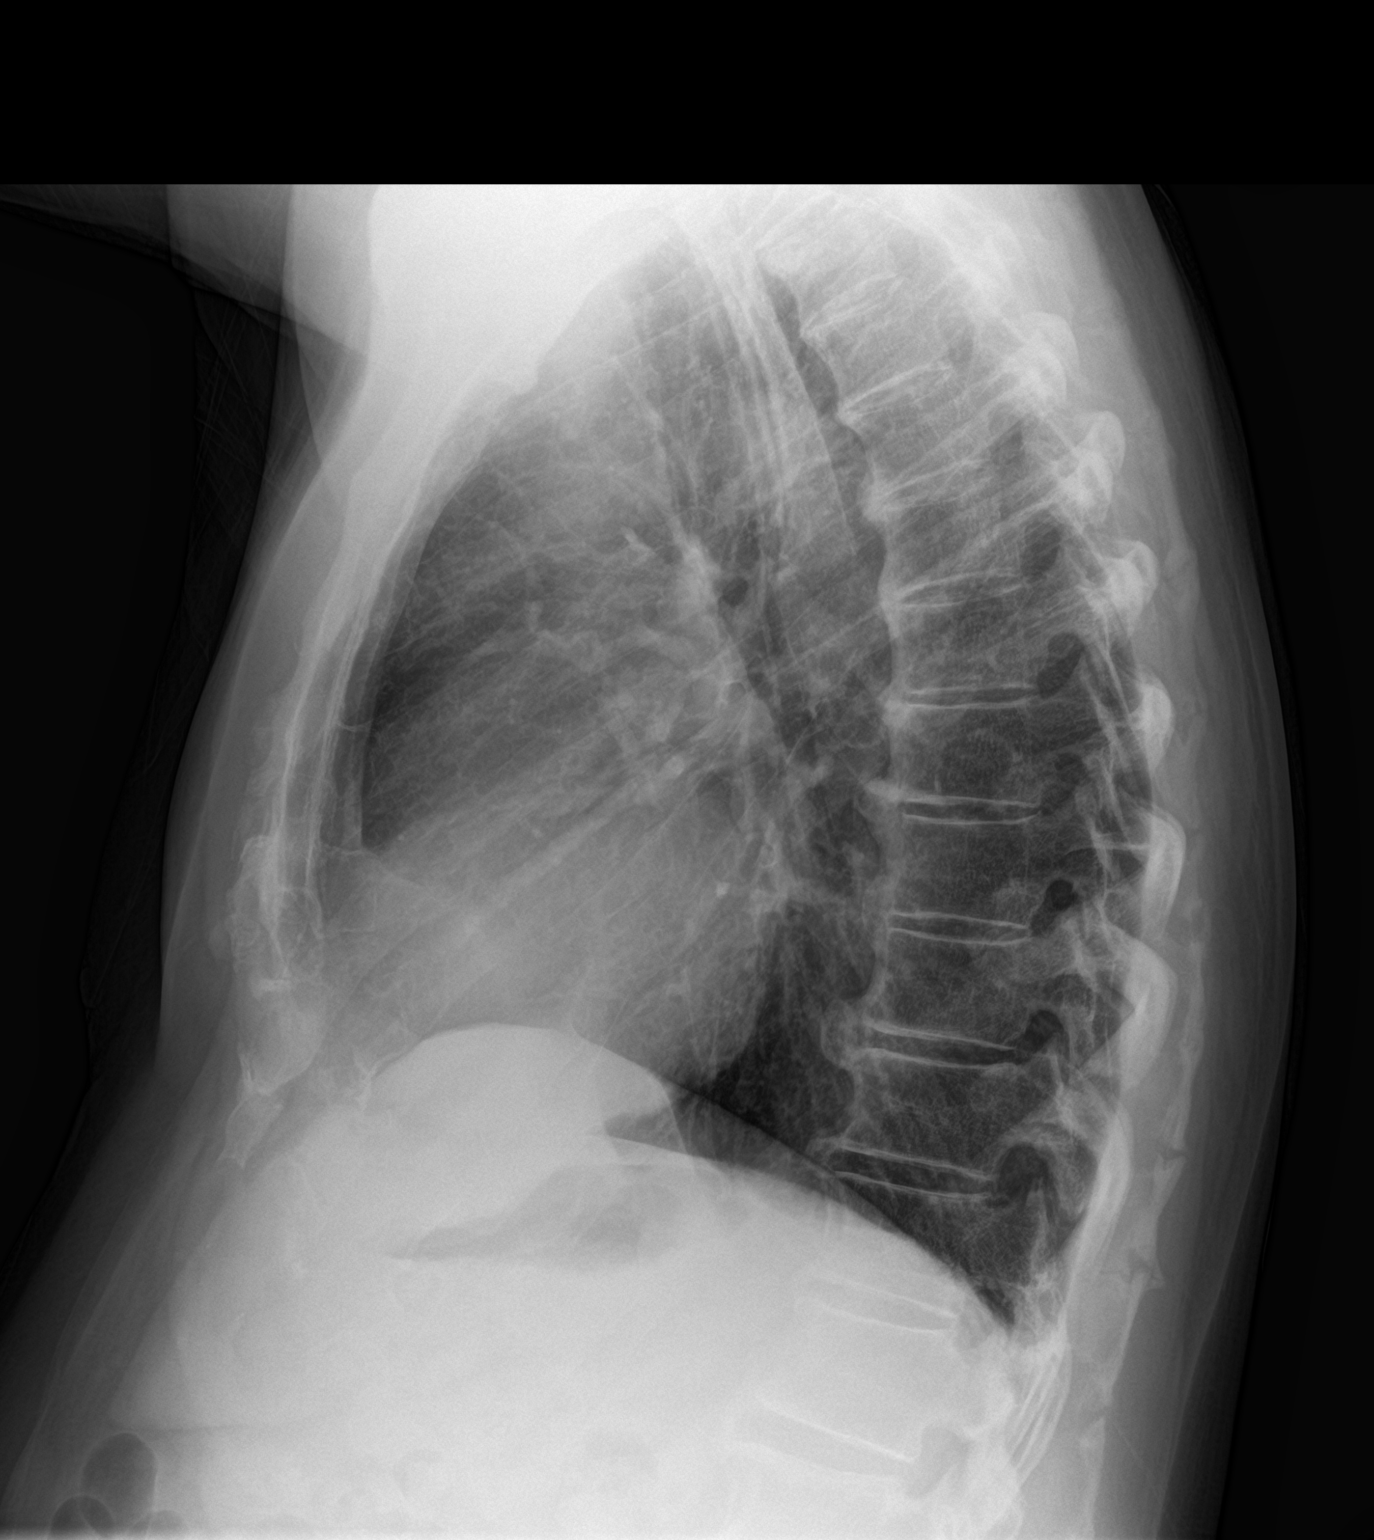

[2 of 2 positions shown; findings below may reference images not displayed]

FINDINGS: The heart size and mediastinal contours are within normal limits.
Both lungs are clear. The visualized skeletal structures are
unremarkable.
IMPRESSION: No active cardiopulmonary disease.

## 2019-04-11 DIAGNOSIS — M5033 Other cervical disc degeneration, cervicothoracic region: Secondary | ICD-10-CM | POA: Diagnosis not present

## 2019-04-11 DIAGNOSIS — M5416 Radiculopathy, lumbar region: Secondary | ICD-10-CM | POA: Diagnosis not present

## 2019-04-11 DIAGNOSIS — M9903 Segmental and somatic dysfunction of lumbar region: Secondary | ICD-10-CM | POA: Diagnosis not present

## 2019-04-11 DIAGNOSIS — M9901 Segmental and somatic dysfunction of cervical region: Secondary | ICD-10-CM | POA: Diagnosis not present

## 2019-04-18 DIAGNOSIS — B880 Other acariasis: Secondary | ICD-10-CM | POA: Diagnosis not present

## 2019-04-18 DIAGNOSIS — R21 Rash and other nonspecific skin eruption: Secondary | ICD-10-CM | POA: Diagnosis not present

## 2019-05-02 DIAGNOSIS — M9901 Segmental and somatic dysfunction of cervical region: Secondary | ICD-10-CM | POA: Diagnosis not present

## 2019-05-02 DIAGNOSIS — M5416 Radiculopathy, lumbar region: Secondary | ICD-10-CM | POA: Diagnosis not present

## 2019-05-02 DIAGNOSIS — M5033 Other cervical disc degeneration, cervicothoracic region: Secondary | ICD-10-CM | POA: Diagnosis not present

## 2019-05-02 DIAGNOSIS — M9903 Segmental and somatic dysfunction of lumbar region: Secondary | ICD-10-CM | POA: Diagnosis not present

## 2019-05-23 ENCOUNTER — Telehealth: Payer: Self-pay | Admitting: Family Medicine

## 2019-05-23 DIAGNOSIS — Z20828 Contact with and (suspected) exposure to other viral communicable diseases: Secondary | ICD-10-CM | POA: Diagnosis not present

## 2019-05-23 NOTE — Telephone Encounter (Signed)
Patient was advised to go to covid testing center to be tested.

## 2019-05-23 NOTE — Telephone Encounter (Signed)
Pt has been exposed by a family remember that has [place]. Having no symptoms but wants to be tested.  Please call pt back at [phone].  Thanks, [employer]

## 2019-05-29 DIAGNOSIS — M9903 Segmental and somatic dysfunction of lumbar region: Secondary | ICD-10-CM | POA: Diagnosis not present

## 2019-05-29 DIAGNOSIS — M9901 Segmental and somatic dysfunction of cervical region: Secondary | ICD-10-CM | POA: Diagnosis not present

## 2019-05-29 DIAGNOSIS — M5033 Other cervical disc degeneration, cervicothoracic region: Secondary | ICD-10-CM | POA: Diagnosis not present

## 2019-05-29 DIAGNOSIS — M5416 Radiculopathy, lumbar region: Secondary | ICD-10-CM | POA: Diagnosis not present

## 2019-06-20 ENCOUNTER — Other Ambulatory Visit: Payer: Self-pay

## 2019-06-26 DIAGNOSIS — M5416 Radiculopathy, lumbar region: Secondary | ICD-10-CM | POA: Diagnosis not present

## 2019-06-26 DIAGNOSIS — M9901 Segmental and somatic dysfunction of cervical region: Secondary | ICD-10-CM | POA: Diagnosis not present

## 2019-06-26 DIAGNOSIS — M5033 Other cervical disc degeneration, cervicothoracic region: Secondary | ICD-10-CM | POA: Diagnosis not present

## 2019-06-26 DIAGNOSIS — M9903 Segmental and somatic dysfunction of lumbar region: Secondary | ICD-10-CM | POA: Diagnosis not present

## 2019-07-31 DIAGNOSIS — M5033 Other cervical disc degeneration, cervicothoracic region: Secondary | ICD-10-CM | POA: Diagnosis not present

## 2019-07-31 DIAGNOSIS — M9903 Segmental and somatic dysfunction of lumbar region: Secondary | ICD-10-CM | POA: Diagnosis not present

## 2019-07-31 DIAGNOSIS — M9901 Segmental and somatic dysfunction of cervical region: Secondary | ICD-10-CM | POA: Diagnosis not present

## 2019-07-31 DIAGNOSIS — M5416 Radiculopathy, lumbar region: Secondary | ICD-10-CM | POA: Diagnosis not present

## 2019-08-15 DIAGNOSIS — Z961 Presence of intraocular lens: Secondary | ICD-10-CM | POA: Diagnosis not present

## 2019-08-20 DIAGNOSIS — H1820 Unspecified corneal edema: Secondary | ICD-10-CM | POA: Diagnosis not present

## 2019-09-04 DIAGNOSIS — M9901 Segmental and somatic dysfunction of cervical region: Secondary | ICD-10-CM | POA: Diagnosis not present

## 2019-09-04 DIAGNOSIS — M5033 Other cervical disc degeneration, cervicothoracic region: Secondary | ICD-10-CM | POA: Diagnosis not present

## 2019-09-04 DIAGNOSIS — M9903 Segmental and somatic dysfunction of lumbar region: Secondary | ICD-10-CM | POA: Diagnosis not present

## 2019-09-04 DIAGNOSIS — M5416 Radiculopathy, lumbar region: Secondary | ICD-10-CM | POA: Diagnosis not present

## 2019-09-08 ENCOUNTER — Ambulatory Visit: Payer: Medicare Other | Attending: Internal Medicine

## 2019-09-08 DIAGNOSIS — Z23 Encounter for immunization: Secondary | ICD-10-CM | POA: Insufficient documentation

## 2019-09-08 NOTE — Progress Notes (Signed)
   Covid-19 Vaccination Clinic  Name:  Dennis Steele    MRN: [id number] DOB: Sep 13, 1950  09/08/2019  [name] was observed post Covid-19 immunization for 15 minutes without incidence. He was provided with Vaccine Information Sheet and instruction to access the V-Safe system.   [name] was instructed to call 911 with any severe reactions post vaccine: [name] Kitchen Difficulty breathing  . Swelling of your face and throat  . A fast heartbeat  . A bad rash all over your body  . Dizziness and weakness    Immunizations Administered    Name Date Dose VIS Date Route   Pfizer COVID-19 Vaccine 09/08/2019  9:13 AM 0.3 mL 07/06/2019 Intramuscular   Manufacturer: [place]   Lot: [id number]   [place]: [id number]

## 2019-09-11 DIAGNOSIS — H401330 Pigmentary glaucoma, bilateral, stage unspecified: Secondary | ICD-10-CM | POA: Diagnosis not present

## 2019-09-11 DIAGNOSIS — S63634A Sprain of interphalangeal joint of right ring finger, initial encounter: Secondary | ICD-10-CM | POA: Diagnosis not present

## 2019-09-11 DIAGNOSIS — S63636A Sprain of interphalangeal joint of right little finger, initial encounter: Secondary | ICD-10-CM | POA: Diagnosis not present

## 2019-09-12 DIAGNOSIS — D485 Neoplasm of uncertain behavior of skin: Secondary | ICD-10-CM | POA: Diagnosis not present

## 2019-09-12 DIAGNOSIS — D1801 Hemangioma of skin and subcutaneous tissue: Secondary | ICD-10-CM | POA: Diagnosis not present

## 2019-09-12 DIAGNOSIS — H40021 Open angle with borderline findings, high risk, right eye: Secondary | ICD-10-CM | POA: Diagnosis not present

## 2019-09-12 DIAGNOSIS — Z86018 Personal history of other benign neoplasm: Secondary | ICD-10-CM | POA: Diagnosis not present

## 2019-09-12 DIAGNOSIS — Z85828 Personal history of other malignant neoplasm of skin: Secondary | ICD-10-CM | POA: Diagnosis not present

## 2019-09-12 DIAGNOSIS — L578 Other skin changes due to chronic exposure to nonionizing radiation: Secondary | ICD-10-CM | POA: Diagnosis not present

## 2019-09-12 DIAGNOSIS — L570 Actinic keratosis: Secondary | ICD-10-CM | POA: Diagnosis not present

## 2019-09-12 DIAGNOSIS — L820 Inflamed seborrheic keratosis: Secondary | ICD-10-CM | POA: Diagnosis not present

## 2019-09-12 DIAGNOSIS — Z1283 Encounter for screening for malignant neoplasm of skin: Secondary | ICD-10-CM | POA: Diagnosis not present

## 2019-09-12 DIAGNOSIS — L814 Other melanin hyperpigmentation: Secondary | ICD-10-CM | POA: Diagnosis not present

## 2019-09-12 DIAGNOSIS — L821 Other seborrheic keratosis: Secondary | ICD-10-CM | POA: Diagnosis not present

## 2019-09-13 DIAGNOSIS — H40021 Open angle with borderline findings, high risk, right eye: Secondary | ICD-10-CM | POA: Diagnosis not present

## 2019-09-20 DIAGNOSIS — H4010X3 Unspecified open-angle glaucoma, severe stage: Secondary | ICD-10-CM | POA: Diagnosis not present

## 2019-09-24 DIAGNOSIS — H4010X3 Unspecified open-angle glaucoma, severe stage: Secondary | ICD-10-CM | POA: Diagnosis not present

## 2019-09-25 DIAGNOSIS — M5033 Other cervical disc degeneration, cervicothoracic region: Secondary | ICD-10-CM | POA: Diagnosis not present

## 2019-09-25 DIAGNOSIS — M5416 Radiculopathy, lumbar region: Secondary | ICD-10-CM | POA: Diagnosis not present

## 2019-09-25 DIAGNOSIS — M9901 Segmental and somatic dysfunction of cervical region: Secondary | ICD-10-CM | POA: Diagnosis not present

## 2019-09-25 DIAGNOSIS — M9903 Segmental and somatic dysfunction of lumbar region: Secondary | ICD-10-CM | POA: Diagnosis not present

## 2019-09-26 DIAGNOSIS — Z20822 Contact with and (suspected) exposure to covid-19: Secondary | ICD-10-CM | POA: Diagnosis not present

## 2019-09-26 DIAGNOSIS — H4010X3 Unspecified open-angle glaucoma, severe stage: Secondary | ICD-10-CM | POA: Diagnosis not present

## 2019-09-28 DIAGNOSIS — H4041X3 Glaucoma secondary to eye inflammation, right eye, severe stage: Secondary | ICD-10-CM | POA: Diagnosis not present

## 2019-09-28 DIAGNOSIS — Z961 Presence of intraocular lens: Secondary | ICD-10-CM | POA: Diagnosis not present

## 2019-09-28 DIAGNOSIS — H401413 Capsular glaucoma with pseudoexfoliation of lens, right eye, severe stage: Secondary | ICD-10-CM | POA: Diagnosis not present

## 2019-10-02 ENCOUNTER — Ambulatory Visit: Payer: Medicare Other | Attending: Internal Medicine

## 2019-10-02 DIAGNOSIS — Z23 Encounter for immunization: Secondary | ICD-10-CM | POA: Insufficient documentation

## 2019-10-02 NOTE — Progress Notes (Signed)
   Covid-19 Vaccination Clinic  Name:  Dennis Steele    MRN: [id number] DOB: Sep 19, 1950  10/02/2019  [name] was observed post Covid-19 immunization for 15 minutes without incident. He was provided with Vaccine Information Sheet and instruction to access the V-Safe system.   [name] was instructed to call 911 with any severe reactions post vaccine: [name] Kitchen Difficulty breathing  . Swelling of face and throat  . A fast heartbeat  . A bad rash all over body  . Dizziness and weakness   Immunizations Administered    Name Date Dose VIS Date Route   Pfizer COVID-19 Vaccine 10/02/2019  2:55 PM 0.3 mL 07/06/2019 Intramuscular   Manufacturer: [place]   Lot: [id number]   [place]: [id number]

## 2019-10-03 DIAGNOSIS — Z8546 Personal history of malignant neoplasm of prostate: Secondary | ICD-10-CM | POA: Diagnosis not present

## 2019-10-07 DIAGNOSIS — D239 Other benign neoplasm of skin, unspecified: Secondary | ICD-10-CM

## 2019-10-07 DIAGNOSIS — C4492 Squamous cell carcinoma of skin, unspecified: Secondary | ICD-10-CM

## 2019-10-09 DIAGNOSIS — N5201 Erectile dysfunction due to arterial insufficiency: Secondary | ICD-10-CM | POA: Diagnosis not present

## 2019-10-09 DIAGNOSIS — Z8546 Personal history of malignant neoplasm of prostate: Secondary | ICD-10-CM | POA: Diagnosis not present

## 2019-10-25 ENCOUNTER — Ambulatory Visit: Payer: Medicare Other | Admitting: Dermatology

## 2019-10-30 DIAGNOSIS — M9901 Segmental and somatic dysfunction of cervical region: Secondary | ICD-10-CM | POA: Diagnosis not present

## 2019-10-30 DIAGNOSIS — M9903 Segmental and somatic dysfunction of lumbar region: Secondary | ICD-10-CM | POA: Diagnosis not present

## 2019-10-30 DIAGNOSIS — M5416 Radiculopathy, lumbar region: Secondary | ICD-10-CM | POA: Diagnosis not present

## 2019-10-30 DIAGNOSIS — M5033 Other cervical disc degeneration, cervicothoracic region: Secondary | ICD-10-CM | POA: Diagnosis not present

## 2019-11-26 DIAGNOSIS — S39012A Strain of muscle, fascia and tendon of lower back, initial encounter: Secondary | ICD-10-CM | POA: Diagnosis not present

## 2019-11-26 DIAGNOSIS — M48061 Spinal stenosis, lumbar region without neurogenic claudication: Secondary | ICD-10-CM | POA: Diagnosis not present

## 2020-01-09 ENCOUNTER — Other Ambulatory Visit: Payer: Self-pay | Admitting: Family Medicine

## 2020-01-09 DIAGNOSIS — M5416 Radiculopathy, lumbar region: Secondary | ICD-10-CM | POA: Diagnosis not present

## 2020-01-09 DIAGNOSIS — M5033 Other cervical disc degeneration, cervicothoracic region: Secondary | ICD-10-CM | POA: Diagnosis not present

## 2020-01-09 DIAGNOSIS — M9901 Segmental and somatic dysfunction of cervical region: Secondary | ICD-10-CM | POA: Diagnosis not present

## 2020-01-09 DIAGNOSIS — M9903 Segmental and somatic dysfunction of lumbar region: Secondary | ICD-10-CM | POA: Diagnosis not present

## 2020-01-09 NOTE — Telephone Encounter (Signed)
Medication Refill - Medication: meloxicam   Has the patient contacted their pharmacy? Yes.   (Agent: If no, request that the patient contact the pharmacy for the refill.) (Agent: If yes, when and what did the pharmacy advise?)  Preferred Pharmacy (with phone number or street name):  [place], [place] - [address]  [place] [address]  Phone: [phone] Fax: [phone]  Hours: Not open 24 hours    Agent: Please be advised that RX refills may take up to 3 business days. We ask that you follow-up with your pharmacy.

## 2020-01-10 MED ORDER — MELOXICAM 7.5 MG PO TABS: 7.5000 mg | ORAL_TABLET | Freq: Two times a day (BID) | ORAL | 3 refills | Status: DC | PRN

## 2020-01-10 NOTE — Telephone Encounter (Signed)
Meloxicam 7.5 mg twice daily as needed #60, 3 refills.

## 2020-01-22 ENCOUNTER — Encounter: Payer: Medicare Other | Admitting: Family Medicine

## 2020-02-27 ENCOUNTER — Other Ambulatory Visit: Payer: Self-pay | Admitting: Family Medicine

## 2020-02-27 DIAGNOSIS — I10 Essential (primary) hypertension: Secondary | ICD-10-CM

## 2020-02-27 NOTE — Telephone Encounter (Signed)
Pt has appt 03/18/20  With Dr. Rosanna Randy

## 2020-03-04 ENCOUNTER — Telehealth: Payer: Self-pay

## 2020-03-04 MED ORDER — FLUCONAZOLE 200 MG PO TABS: 200.0000 mg | ORAL_TABLET | ORAL | 0 refills | Status: DC

## 2020-03-04 NOTE — Telephone Encounter (Signed)
Spoke with patient and advised him of information per Dr. Nehemiah Massed. He has agreed to try oral treatment before coming in. He has a few samples of Ala-Quin at home to continue to use. If the rash is still present next week he will call me back to send in compounded cream.   Dr. Nehemiah Massed does not need to call patient back this afternoon.

## 2020-03-04 NOTE — Telephone Encounter (Signed)
Called and spoke with patient regarding a rash around groin area. He has been applying Ala-Quin which helps but does not completely improve condition. He states when speaking with you that you had mentioned just sending some medication in, rather then a office visit.

## 2020-03-04 NOTE — Telephone Encounter (Signed)
Please call pt and advise that I am in surgery all day and also had to review previous information in a different software due to our Ransomware issue and had to review previous records in different software. Advise him that I am sending Diflucan (Fluconazole) 200 mg - take 1 po on Tuesday (today) Thursday and Saturday disp #3 with 0rf May continue Alcortin A (but they do not make anymore and if needs more, can send a mix from [facility] that can be mailed to him etc)  He has intertrigo that is worsened and exacerbated by sweat and activity and friction and can develop yeast and inflammation and bacterial overgrowth.  It can recur with similar conditions and not curable but controllable.  This is all complicated by Psoriasis inversa which also occurs in same area and can flare with above and stress.  I can call him after 5:30 if he would like.

## 2020-03-04 NOTE — Telephone Encounter (Signed)
Patient has now called back this afternoon and would like a phone call back from you.   [phone]

## 2020-03-05 ENCOUNTER — Ambulatory Visit: Payer: Medicare Other | Admitting: Dermatology

## 2020-03-12 ENCOUNTER — Ambulatory Visit (INDEPENDENT_AMBULATORY_CARE_PROVIDER_SITE_OTHER): Payer: Medicare Other | Admitting: Dermatology

## 2020-03-12 ENCOUNTER — Other Ambulatory Visit: Payer: Self-pay

## 2020-03-12 DIAGNOSIS — L814 Other melanin hyperpigmentation: Secondary | ICD-10-CM | POA: Diagnosis not present

## 2020-03-12 DIAGNOSIS — Z1283 Encounter for screening for malignant neoplasm of skin: Secondary | ICD-10-CM | POA: Diagnosis not present

## 2020-03-12 DIAGNOSIS — D485 Neoplasm of uncertain behavior of skin: Secondary | ICD-10-CM | POA: Diagnosis not present

## 2020-03-12 DIAGNOSIS — L409 Psoriasis, unspecified: Secondary | ICD-10-CM

## 2020-03-12 DIAGNOSIS — L820 Inflamed seborrheic keratosis: Secondary | ICD-10-CM

## 2020-03-12 DIAGNOSIS — L570 Actinic keratosis: Secondary | ICD-10-CM

## 2020-03-12 DIAGNOSIS — D1800 Hemangioma unspecified site: Secondary | ICD-10-CM | POA: Diagnosis not present

## 2020-03-12 DIAGNOSIS — D229 Melanocytic nevi, unspecified: Secondary | ICD-10-CM | POA: Diagnosis not present

## 2020-03-12 DIAGNOSIS — L821 Other seborrheic keratosis: Secondary | ICD-10-CM | POA: Diagnosis not present

## 2020-03-12 DIAGNOSIS — L578 Other skin changes due to chronic exposure to nonionizing radiation: Secondary | ICD-10-CM | POA: Diagnosis not present

## 2020-03-12 DIAGNOSIS — Z85828 Personal history of other malignant neoplasm of skin: Secondary | ICD-10-CM

## 2020-03-12 NOTE — Progress Notes (Signed)
Follow-Up Visit   Subjective  Dennis Steele is a 69 y.o. male who presents for the following: UBSE (patient has noticed lesions on the R sideburn, chest, and back that is his concerned about and would like checked). The patient presents for Upper Body Skin Exam (UBSE) for skin cancer screening and mole check.  The following portions of the chart were reviewed this encounter and updated as appropriate:  Tobacco  Allergies  Meds  Problems  Med Hx  Surg Hx  Fam Hx     Review of Systems:  No other skin or systemic complaints except as noted in HPI or Assessment and Plan.  Objective  Well appearing patient in no apparent distress; mood and affect are within normal limits.  All skin waist up examined and groin/genitals.  Objective  groin: Clear   Objective  Right Ear, back x 2, R face x 3 (6): Erythematous keratotic or waxy stuck-on papule or plaque.   Objective  Face (10): Erythematous thin papules/macules with gritty scale.   Objective  R sup med pectoral: 0.4 cm dark brown macule   Assessment & Plan  Psoriasis Inversa complicated by intertrigo -  groin May worsen with stress (mental and physical), friction, sweating and changes in weather. It is treatable, but not curable.  Control is goal.  There will be periodic flares. Start [facility] mix Iodoquinol, 1%Hydrocortisone, 2.5% Niacinamide 2% to aa's BID PRN.   Inflamed seborrheic keratosis (6) Right Ear, back x 2, R face x 3  Destruction of lesion - Right Ear, back x 2, R face x 3 Complexity: simple   Destruction method: cryotherapy   Informed consent: discussed and consent obtained   Timeout:  patient name, date of birth, surgical site, and procedure verified Lesion destroyed using liquid nitrogen: Yes   Region frozen until ice ball extended beyond lesion: Yes   Outcome: patient tolerated procedure well with no complications   Post-procedure details: wound care instructions given    AK (actinic  keratosis) (10) Face  Destruction of lesion - Face Complexity: simple   Destruction method: cryotherapy   Informed consent: discussed and consent obtained   Timeout:  patient name, date of birth, surgical site, and procedure verified Lesion destroyed using liquid nitrogen: Yes   Region frozen until ice ball extended beyond lesion: Yes   Outcome: patient tolerated procedure well with no complications   Post-procedure details: wound care instructions given    Neoplasm of uncertain behavior of skin R sup med pectoral  Epidermal / dermal shaving  Lesion diameter (cm):  0.4 Informed consent: discussed and consent obtained   Timeout: patient name, date of birth, surgical site, and procedure verified   Procedure prep:  Patient was prepped and draped in usual sterile fashion Prep type:  Isopropyl alcohol Anesthesia: the lesion was anesthetized in a standard fashion   Anesthetic:  1% lidocaine w/ epinephrine 1-100,000 buffered w/ 8.4% NaHCO3 Instrument used: flexible razor blade   Hemostasis achieved with: pressure, aluminum chloride and electrodesiccation   Outcome: patient tolerated procedure well   Post-procedure details: sterile dressing applied and wound care instructions given   Dressing type: bandage and petrolatum    Specimen 1 - Surgical pathology Differential Diagnosis: D48.5 r/o dysplastic nevus  Check Margins: No 0.4 cm dark brown macule   Lentigines - Scattered tan macules - Discussed due to sun exposure - Benign, observe - Call for any changes  Seborrheic Keratoses - Stuck-on, waxy, tan-brown papules and plaques  - Discussed benign  etiology and prognosis. - Observe - Call for any changes  Melanocytic Nevi - Tan-brown and/or pink-flesh-colored symmetric macules and papules - Benign appearing on exam today - Observation - Call clinic for new or changing moles - Recommend daily use of broad spectrum spf 30+ sunscreen to sun-exposed areas.   Hemangiomas - Red  papules - Discussed benign nature - Observe - Call for any changes  Actinic Damage - diffuse scaly erythematous macules with underlying dyspigmentation - Recommend daily broad spectrum sunscreen SPF 30+ to sun-exposed areas, reapply every 2 hours as needed.  - Call for new or changing lesions.  History of Basal Cell Carcinoma of the Skin - No evidence of recurrence today - Recommend regular full body skin exams - Recommend daily broad spectrum sunscreen SPF 30+ to sun-exposed areas, reapply every 2 hours as needed.  - Call if any new or changing lesions are noted between office visits  History of Squamous Cell Carcinoma of the Skin - No evidence of recurrence today - No lymphadenopathy - Recommend regular full body skin exams - Recommend daily broad spectrum sunscreen SPF 30+ to sun-exposed areas, reapply every 2 hours as needed.  - Call if any new or changing lesions are noted between office visits  Skin cancer screening performed today.  Return in about 4 months (around 07/12/2020) for TBSE in 4-6 months.  Luther Redo, CMA, am acting as scribe for Sarina Ser, MD .  Documentation: I have reviewed the above documentation for accuracy and completeness, and I agree with the above.  Sarina Ser, MD

## 2020-03-12 NOTE — Patient Instructions (Addendum)
Wound Care Instructions  1. Cleanse wound gently with soap and water once a day then pat dry with clean gauze. Apply a thing coat of Petrolatum (petroleum jelly, "Vaseline") over the wound (unless you have an allergy to this). We recommend that you use a new, sterile tube of Vaseline. Do not pick or remove scabs. Do not remove the yellow or white "healing tissue" from the base of the wound.  2. Cover the wound with fresh, clean, nonstick gauze and secure with paper tape. You may use Band-Aids in place of gauze and tape if the would is small enough, but would recommend trimming much of the tape off as there is often too much. Sometimes Band-Aids can irritate the skin.  3. You should call the office for your biopsy report after 1 week if you have not already been contacted.  4. If you experience any problems, such as abnormal amounts of bleeding, swelling, significant bruising, significant pain, or evidence of infection, please call the office immediately.  5. FOR ADULT SURGERY PATIENTS: If you need something for pain relief you may take 1 extra strength Tylenol (acetaminophen) AND 2 Ibuprofen (200mg  each) together every 4 hours as needed for pain. (do not take these if you are allergic to them or if you have a reason you should not take them.) Typically, you may only need pain medication for 1 to 3 days.    Recommend Zeasorb AF powder daily to the groin to prevent rash.

## 2020-03-17 ENCOUNTER — Encounter: Payer: Self-pay | Admitting: Dermatology

## 2020-03-17 ENCOUNTER — Telehealth: Payer: Self-pay

## 2020-03-17 NOTE — Telephone Encounter (Signed)
Patient advised of results/hd

## 2020-03-17 NOTE — Telephone Encounter (Signed)
-----   Message from Ralene Bathe, MD sent at 03/17/2020 10:14 AM EDT ----- Skin , right sup med pectoral PIGMENTED SEBORRHEIC KERATOSIS  Benign keratosis No further treatment necessary Keep future appointments

## 2020-03-18 ENCOUNTER — Encounter: Payer: Medicare Other | Admitting: Family Medicine

## 2020-03-20 DIAGNOSIS — H4010X3 Unspecified open-angle glaucoma, severe stage: Secondary | ICD-10-CM | POA: Diagnosis not present

## 2020-03-28 ENCOUNTER — Other Ambulatory Visit: Payer: Self-pay | Admitting: Family Medicine

## 2020-03-28 DIAGNOSIS — I10 Essential (primary) hypertension: Secondary | ICD-10-CM

## 2020-04-07 DIAGNOSIS — S46012A Strain of muscle(s) and tendon(s) of the rotator cuff of left shoulder, initial encounter: Secondary | ICD-10-CM | POA: Diagnosis not present

## 2020-04-16 DIAGNOSIS — M9903 Segmental and somatic dysfunction of lumbar region: Secondary | ICD-10-CM | POA: Diagnosis not present

## 2020-04-16 DIAGNOSIS — M9901 Segmental and somatic dysfunction of cervical region: Secondary | ICD-10-CM | POA: Diagnosis not present

## 2020-04-16 DIAGNOSIS — S46012A Strain of muscle(s) and tendon(s) of the rotator cuff of left shoulder, initial encounter: Secondary | ICD-10-CM | POA: Diagnosis not present

## 2020-04-16 DIAGNOSIS — M19012 Primary osteoarthritis, left shoulder: Secondary | ICD-10-CM | POA: Diagnosis not present

## 2020-04-16 DIAGNOSIS — M5416 Radiculopathy, lumbar region: Secondary | ICD-10-CM | POA: Diagnosis not present

## 2020-04-16 DIAGNOSIS — M5033 Other cervical disc degeneration, cervicothoracic region: Secondary | ICD-10-CM | POA: Diagnosis not present

## 2020-04-17 DIAGNOSIS — Z20822 Contact with and (suspected) exposure to covid-19: Secondary | ICD-10-CM | POA: Diagnosis not present

## 2020-04-21 DIAGNOSIS — M75122 Complete rotator cuff tear or rupture of left shoulder, not specified as traumatic: Secondary | ICD-10-CM | POA: Diagnosis not present

## 2020-04-21 DIAGNOSIS — M19019 Primary osteoarthritis, unspecified shoulder: Secondary | ICD-10-CM | POA: Diagnosis not present

## 2020-04-22 DIAGNOSIS — Z03818 Encounter for observation for suspected exposure to other biological agents ruled out: Secondary | ICD-10-CM | POA: Diagnosis not present

## 2020-04-22 DIAGNOSIS — Z20822 Contact with and (suspected) exposure to covid-19: Secondary | ICD-10-CM | POA: Diagnosis not present

## 2020-04-22 DIAGNOSIS — U071 COVID-19: Secondary | ICD-10-CM | POA: Diagnosis not present

## 2020-04-23 ENCOUNTER — Telehealth: Payer: Self-pay

## 2020-04-23 ENCOUNTER — Encounter: Payer: Self-pay | Admitting: Physician Assistant

## 2020-04-23 ENCOUNTER — Other Ambulatory Visit: Payer: Self-pay | Admitting: Physician Assistant

## 2020-04-23 DIAGNOSIS — C61 Malignant neoplasm of prostate: Secondary | ICD-10-CM

## 2020-04-23 DIAGNOSIS — I10 Essential (primary) hypertension: Secondary | ICD-10-CM

## 2020-04-23 DIAGNOSIS — U071 COVID-19: Secondary | ICD-10-CM

## 2020-04-23 NOTE — Progress Notes (Signed)
I connected by phone with Dennis Steele on 04/23/2020 at 4:48 PM to discuss the potential use of a new treatment for mild to moderate COVID-19 viral infection in non-hospitalized patients.  This patient is a 69 y.o. male that meets the FDA criteria for Emergency Use Authorization of COVID monoclonal antibody casirivimab/imdevimab.  Has a (+) direct SARS-CoV-2 viral test result  Has mild or moderate COVID-19   Is NOT hospitalized due to COVID-19  Is within 10 days of symptom onset  Has at least one of the high risk factor(s) for progression to severe COVID-19 and/or hospitalization as defined in EUA.  Specific high risk criteria : Older age (>/= 71 yo), BMI > 25 and Cardiovascular disease or hypertension   I have spoken and communicated the following to the patient or parent/caregiver regarding COVID monoclonal antibody treatment:  1. FDA has authorized the emergency use for the treatment of mild to moderate COVID-19 in adults and pediatric patients with positive results of direct SARS-CoV-2 viral testing who are 56 years of age and older weighing at least 40 kg, and who are at high risk for progressing to severe COVID-19 and/or hospitalization.  2. The significant known and potential risks and benefits of COVID monoclonal antibody, and the extent to which such potential risks and benefits are unknown.  3. Information on available alternative treatments and the risks and benefits of those alternatives, including clinical trials.  4. Patients treated with COVID monoclonal antibody should continue to self-isolate and use infection control measures (e.g., wear mask, isolate, social distance, avoid sharing personal items, clean and disinfect "high touch" surfaces, and frequent handwashing) according to CDC guidelines.   5. The patient or parent/caregiver has the option to accept or refuse COVID monoclonal antibody treatment.  After reviewing this information with the patient, the patient has  agreed to receive one of the available covid 19 monoclonal antibodies and will be provided an appropriate fact sheet prior to infusion.  Sx onset 9/25. Set up for infusion on 9/30 @ 12:30pm. Directions given to [facility]. Pt is aware that insurance will be charged an infusion fee. Pt is fully vaccinated.   Angelena Form 04/23/2020 4:48 PM

## 2020-04-23 NOTE — Telephone Encounter (Signed)
Copied from [place] [id number]. Topic: General - Inquiry >> Apr 23, 2020 11:34 AM Gillis Ends D wrote: Reason for CRM: Patient has a positive covid test and has had symptoms since Sunday. He is interested in the  Monoclonal antibodies. Please give him a call [phone].Please advise

## 2020-04-23 NOTE — Telephone Encounter (Signed)
Patient advised and verbalized understanding 

## 2020-04-23 NOTE — Telephone Encounter (Signed)
He should hear from the infusion clinic this afternoon.  Recommend fluids, Tylenol, Robitussin twice a day vitamin C zinc and vitamin D daily until well.

## 2020-04-24 ENCOUNTER — Ambulatory Visit (HOSPITAL_COMMUNITY)
Admission: RE | Admit: 2020-04-24 | Discharge: 2020-04-24 | Disposition: A | Discharge status: Home / Self Care | Payer: Medicare Other | Source: Ambulatory Visit | Attending: Pulmonary Disease | Admitting: Pulmonary Disease

## 2020-04-24 ENCOUNTER — Other Ambulatory Visit (HOSPITAL_COMMUNITY): Payer: Self-pay

## 2020-04-24 DIAGNOSIS — Z20822 Contact with and (suspected) exposure to covid-19: Secondary | ICD-10-CM | POA: Diagnosis not present

## 2020-04-24 DIAGNOSIS — U071 COVID-19: Secondary | ICD-10-CM | POA: Insufficient documentation

## 2020-04-24 DIAGNOSIS — Z23 Encounter for immunization: Secondary | ICD-10-CM | POA: Diagnosis not present

## 2020-04-24 DIAGNOSIS — C61 Malignant neoplasm of prostate: Secondary | ICD-10-CM | POA: Insufficient documentation

## 2020-04-24 DIAGNOSIS — I10 Essential (primary) hypertension: Secondary | ICD-10-CM | POA: Diagnosis not present

## 2020-04-24 MED ORDER — ALBUTEROL SULFATE HFA 108 (90 BASE) MCG/ACT IN AERS: 2.0000 | INHALATION_SPRAY | Freq: Once | RESPIRATORY_TRACT | Status: DC | PRN

## 2020-04-24 MED ORDER — SODIUM CHLORIDE 0.9 % IV SOLN
1200.0000 mg | Freq: Once | INTRAVENOUS | Status: AC
  Administered 2020-04-24: 1200 mg via INTRAVENOUS

## 2020-04-24 MED ORDER — EPINEPHRINE 0.3 MG/0.3ML IJ SOAJ: 0.3000 mg | Freq: Once | INTRAMUSCULAR | Status: DC | PRN

## 2020-04-24 MED ORDER — METHYLPREDNISOLONE SODIUM SUCC 125 MG IJ SOLR: 125.0000 mg | Freq: Once | INTRAMUSCULAR | Status: DC | PRN

## 2020-04-24 MED ORDER — FAMOTIDINE IN NACL 20-0.9 MG/50ML-% IV SOLN: 20.0000 mg | Freq: Once | INTRAVENOUS | Status: DC | PRN

## 2020-04-24 MED ORDER — DIPHENHYDRAMINE HCL 50 MG/ML IJ SOLN: 50.0000 mg | Freq: Once | INTRAMUSCULAR | Status: DC | PRN

## 2020-04-24 MED ORDER — SODIUM CHLORIDE 0.9 % IV SOLN: INTRAVENOUS | Status: DC | PRN

## 2020-04-24 NOTE — Progress Notes (Addendum)
  Diagnosis: COVID-19  Physician: Dr Wright  Procedure: Covid Infusion Clinic Med: casirivimab\imdevimab infusion - Provided patient with casirivimab\imdevimab fact sheet for patients, parents and caregivers prior to infusion.  Complications: No immediate complications noted.  Discharge: Discharged home   Dennis Steele 04/24/2020   

## 2020-04-24 NOTE — Discharge Instructions (Signed)

## 2020-05-01 ENCOUNTER — Other Ambulatory Visit: Payer: Self-pay | Admitting: Family Medicine

## 2020-05-01 DIAGNOSIS — I10 Essential (primary) hypertension: Secondary | ICD-10-CM

## 2020-05-21 DIAGNOSIS — M9901 Segmental and somatic dysfunction of cervical region: Secondary | ICD-10-CM | POA: Diagnosis not present

## 2020-05-21 DIAGNOSIS — M9903 Segmental and somatic dysfunction of lumbar region: Secondary | ICD-10-CM | POA: Diagnosis not present

## 2020-05-21 DIAGNOSIS — M5033 Other cervical disc degeneration, cervicothoracic region: Secondary | ICD-10-CM | POA: Diagnosis not present

## 2020-05-21 DIAGNOSIS — M5416 Radiculopathy, lumbar region: Secondary | ICD-10-CM | POA: Diagnosis not present

## 2020-05-26 ENCOUNTER — Telehealth: Payer: Self-pay | Admitting: Family Medicine

## 2020-05-26 NOTE — Telephone Encounter (Signed)
Patients CPE appointment was rescheduled from 11-24th to Jan 18 , 2022 at 10:20 am. He is wanting a sooner CPE appointment.  Wanting to be worked in for a sooner appt.  Please call pt back to advise.  Thanks, [employer]

## 2020-05-27 NOTE — Telephone Encounter (Signed)
How about 06/30/20 at 3 pm?

## 2020-05-27 NOTE — Telephone Encounter (Signed)
Patient was advised and rescheduled.

## 2020-05-29 DIAGNOSIS — M48062 Spinal stenosis, lumbar region with neurogenic claudication: Secondary | ICD-10-CM | POA: Diagnosis not present

## 2020-05-29 DIAGNOSIS — M75122 Complete rotator cuff tear or rupture of left shoulder, not specified as traumatic: Secondary | ICD-10-CM | POA: Diagnosis not present

## 2020-05-29 DIAGNOSIS — M1611 Unilateral primary osteoarthritis, right hip: Secondary | ICD-10-CM | POA: Diagnosis not present

## 2020-05-29 DIAGNOSIS — M19012 Primary osteoarthritis, left shoulder: Secondary | ICD-10-CM | POA: Diagnosis not present

## 2020-05-29 DIAGNOSIS — I10 Essential (primary) hypertension: Secondary | ICD-10-CM | POA: Diagnosis not present

## 2020-05-29 DIAGNOSIS — M19019 Primary osteoarthritis, unspecified shoulder: Secondary | ICD-10-CM | POA: Diagnosis not present

## 2020-05-29 DIAGNOSIS — Z01812 Encounter for preprocedural laboratory examination: Secondary | ICD-10-CM | POA: Diagnosis not present

## 2020-06-02 DIAGNOSIS — Z8546 Personal history of malignant neoplasm of prostate: Secondary | ICD-10-CM | POA: Diagnosis not present

## 2020-06-02 DIAGNOSIS — Z79899 Other long term (current) drug therapy: Secondary | ICD-10-CM | POA: Diagnosis not present

## 2020-06-02 DIAGNOSIS — H4089 Other specified glaucoma: Secondary | ICD-10-CM | POA: Diagnosis not present

## 2020-06-02 DIAGNOSIS — M19212 Secondary osteoarthritis, left shoulder: Secondary | ICD-10-CM | POA: Diagnosis not present

## 2020-06-02 DIAGNOSIS — Z87891 Personal history of nicotine dependence: Secondary | ICD-10-CM | POA: Diagnosis not present

## 2020-06-02 DIAGNOSIS — K219 Gastro-esophageal reflux disease without esophagitis: Secondary | ICD-10-CM | POA: Diagnosis not present

## 2020-06-02 DIAGNOSIS — G8918 Other acute postprocedural pain: Secondary | ICD-10-CM | POA: Diagnosis not present

## 2020-06-02 DIAGNOSIS — H409 Unspecified glaucoma: Secondary | ICD-10-CM | POA: Diagnosis not present

## 2020-06-02 DIAGNOSIS — Z23 Encounter for immunization: Secondary | ICD-10-CM | POA: Diagnosis not present

## 2020-06-02 DIAGNOSIS — M75122 Complete rotator cuff tear or rupture of left shoulder, not specified as traumatic: Secondary | ICD-10-CM | POA: Diagnosis not present

## 2020-06-02 DIAGNOSIS — Z96641 Presence of right artificial hip joint: Secondary | ICD-10-CM | POA: Diagnosis not present

## 2020-06-02 DIAGNOSIS — M19012 Primary osteoarthritis, left shoulder: Secondary | ICD-10-CM | POA: Diagnosis not present

## 2020-06-02 DIAGNOSIS — I10 Essential (primary) hypertension: Secondary | ICD-10-CM | POA: Diagnosis not present

## 2020-06-02 DIAGNOSIS — G629 Polyneuropathy, unspecified: Secondary | ICD-10-CM | POA: Diagnosis not present

## 2020-06-02 DIAGNOSIS — Z981 Arthrodesis status: Secondary | ICD-10-CM | POA: Diagnosis not present

## 2020-06-02 DIAGNOSIS — H9190 Unspecified hearing loss, unspecified ear: Secondary | ICD-10-CM | POA: Diagnosis not present

## 2020-06-02 DIAGNOSIS — M25512 Pain in left shoulder: Secondary | ICD-10-CM | POA: Diagnosis not present

## 2020-06-02 DIAGNOSIS — Z9221 Personal history of antineoplastic chemotherapy: Secondary | ICD-10-CM | POA: Diagnosis not present

## 2020-06-03 DIAGNOSIS — M19212 Secondary osteoarthritis, left shoulder: Secondary | ICD-10-CM | POA: Diagnosis not present

## 2020-06-03 DIAGNOSIS — G629 Polyneuropathy, unspecified: Secondary | ICD-10-CM | POA: Diagnosis not present

## 2020-06-03 DIAGNOSIS — I10 Essential (primary) hypertension: Secondary | ICD-10-CM | POA: Diagnosis not present

## 2020-06-03 DIAGNOSIS — K219 Gastro-esophageal reflux disease without esophagitis: Secondary | ICD-10-CM | POA: Diagnosis not present

## 2020-06-03 DIAGNOSIS — M75122 Complete rotator cuff tear or rupture of left shoulder, not specified as traumatic: Secondary | ICD-10-CM | POA: Diagnosis not present

## 2020-06-03 DIAGNOSIS — H409 Unspecified glaucoma: Secondary | ICD-10-CM | POA: Diagnosis not present

## 2020-06-16 DIAGNOSIS — M19012 Primary osteoarthritis, left shoulder: Secondary | ICD-10-CM | POA: Diagnosis not present

## 2020-06-16 DIAGNOSIS — M75122 Complete rotator cuff tear or rupture of left shoulder, not specified as traumatic: Secondary | ICD-10-CM | POA: Diagnosis not present

## 2020-06-18 ENCOUNTER — Encounter: Payer: Medicare Other | Admitting: Family Medicine

## 2020-06-30 ENCOUNTER — Other Ambulatory Visit: Payer: Self-pay

## 2020-06-30 ENCOUNTER — Encounter: Payer: Self-pay | Admitting: Family Medicine

## 2020-06-30 ENCOUNTER — Ambulatory Visit (INDEPENDENT_AMBULATORY_CARE_PROVIDER_SITE_OTHER): Payer: Medicare Other | Admitting: Family Medicine

## 2020-06-30 VITALS — BP 125/87 | HR 88 | Resp 16 | Ht 71.0 in | Wt 202.0 lb

## 2020-06-30 DIAGNOSIS — M5416 Radiculopathy, lumbar region: Secondary | ICD-10-CM | POA: Diagnosis not present

## 2020-06-30 DIAGNOSIS — Z1211 Encounter for screening for malignant neoplasm of colon: Secondary | ICD-10-CM | POA: Diagnosis not present

## 2020-06-30 DIAGNOSIS — Z0000 Encounter for general adult medical examination without abnormal findings: Secondary | ICD-10-CM | POA: Diagnosis not present

## 2020-06-30 DIAGNOSIS — H4010X3 Unspecified open-angle glaucoma, severe stage: Secondary | ICD-10-CM | POA: Diagnosis not present

## 2020-06-30 DIAGNOSIS — E7800 Pure hypercholesterolemia, unspecified: Secondary | ICD-10-CM | POA: Diagnosis not present

## 2020-06-30 DIAGNOSIS — I10 Essential (primary) hypertension: Secondary | ICD-10-CM | POA: Diagnosis not present

## 2020-06-30 DIAGNOSIS — M8949 Other hypertrophic osteoarthropathy, multiple sites: Secondary | ICD-10-CM

## 2020-06-30 DIAGNOSIS — M159 Polyosteoarthritis, unspecified: Secondary | ICD-10-CM

## 2020-06-30 DIAGNOSIS — Z8546 Personal history of malignant neoplasm of prostate: Secondary | ICD-10-CM

## 2020-06-30 MED ORDER — LISINOPRIL 20 MG PO TABS: 20.0000 mg | ORAL_TABLET | Freq: Every day | ORAL | 4 refills | Status: DC

## 2020-06-30 NOTE — Progress Notes (Signed)
Annual Wellness Visit     Patient: Dennis Steele, Male    DOB: 21-Dec-1950, 69 y.o.   MRN: [id number] Visit Date: 06/30/2020  Today's Provider: [name], MD   Chief Complaint  Patient presents with  . Annual Exam   Subjective    Dennis Steele is a 69 y.o. male who presents today for his Annual Wellness Visit. He reports consuming a general diet. The patient does not participate in regular exercise at present. He generally feels fairly well. He reports sleeping fairly well. He does not have additional problems to discuss today.   Patient had shoulder surgery about 4 weeks ago by Dr. Cathie Olden [facility]). He is recovering well.  He had a total shoulder replacement.  He gets his sling off later this week.      Medications: Outpatient Medications Prior to Visit  Medication Sig  . azelastine (ASTELIN) 0.1 % nasal spray Place 2 sprays into both nostrils 2 (two) times daily. Use in each nostril as directed  . calcium carbonate (TUMS - DOSED IN MG ELEMENTAL CALCIUM) 500 MG chewable tablet Chew 1 tablet by mouth daily.  . fluconazole (DIFLUCAN) 200 MG tablet Take 1 tablet (200 mg total) by mouth as directed. Take 1 PO on Tuesday, Thursday and Saturday  . lisinopril (ZESTRIL) 20 MG tablet TAKE ONE TABLET EVERY DAY  . meloxicam (MOBIC) 7.5 MG tablet Take 1 tablet (7.5 mg total) by mouth 2 (two) times daily as needed for pain.  [name] Kitchen MULTIPLE VITAMIN PO Take by mouth.  . tadalafil (CIALIS) 5 MG tablet Take by mouth.  . timolol (TIMOPTIC) 0.25 % ophthalmic solution Apply to eye.  . Travoprost, BAK Free, (TRAVATAN Z) 0.004 % SOLN ophthalmic solution Apply to eye.  . triamcinolone cream (KENALOG) 0.1 % Apply 1 application topically 3 (three) times daily. To insect bites  . Turmeric (QC TUMERIC COMPLEX PO) Take by mouth.  . valACYclovir (VALTREX) 1000 MG tablet Take 1,000 mg by mouth as needed. Only for outbreaks of fever blisters   No facility-administered medications prior  to visit.    No Known Allergies  Patient Care Team: Jerrol Banana., MD as PCP - General (Family Medicine)  Review of Systems  All other systems reviewed and are negative.      Objective    Vitals: BP 125/87   Pulse 88   Resp 16   Ht 5\' 11"  (1.803 m)   Wt 202 lb (91.6 kg)   BMI 28.17 kg/m  BP Readings from Last 3 Encounters:  06/30/20 125/87  04/24/20 (!) 155/87  03/13/19 (!) 135/95   Wt Readings from Last 3 Encounters:  06/30/20 202 lb (91.6 kg)  03/13/19 204 lb 6.4 oz (92.7 kg)  09/07/18 204 lb (92.5 kg)      Physical Exam Vitals reviewed.  Constitutional:      Appearance: He is well-developed.  HENT:     Head: Normocephalic and atraumatic.     Right Ear: External ear normal.     Left Ear: External ear normal.     Nose: Nose normal.  Eyes:     Conjunctiva/sclera: Conjunctivae normal.     Pupils: Pupils are equal, round, and reactive to light.  Cardiovascular:     Rate and Rhythm: Normal rate and regular rhythm.     Heart sounds: Normal heart sounds.  Pulmonary:     Effort: Pulmonary effort is normal.     Breath sounds: Normal breath sounds.  Abdominal:  General: Bowel sounds are normal.     Palpations: Abdomen is soft.  Genitourinary:    Penis: Normal.      Testes: Normal.  Musculoskeletal:     Cervical back: Normal range of motion and neck supple.     Comments: Surgical scar on leftt shoulder.  Skin:    General: Skin is warm and dry.  Neurological:     General: No focal deficit present.     Mental Status: He is alert and oriented to person, place, and time.     Deep Tendon Reflexes: Reflexes are normal and symmetric.  Psychiatric:        Mood and Affect: Mood normal.        Behavior: Behavior normal.        Thought Content: Thought content normal.        Judgment: Judgment normal.      Most recent functional status assessment: In your present state of health, do you have any difficulty performing the following activities:  06/30/2020  Hearing? N  Vision? N  Difficulty concentrating or making decisions? N  Walking or climbing stairs? N  Dressing or bathing? N  Doing errands, shopping? N  Some recent data might be hidden   Most recent fall risk assessment: Fall Risk  06/30/2020  Falls in the past year? 0  Comment -  Number falls in past yr: 0  Injury with Fall? 0  Follow up Falls evaluation completed    Most recent depression screenings: PHQ 2/9 Scores 06/30/2020 07/06/2018  PHQ - 2 Score 0 0  PHQ- 9 Score 0 -   Most recent cognitive screening: No flowsheet data found. Most recent Audit-C alcohol use screening Alcohol Use Disorder Test (AUDIT) 06/30/2020  1. How often do you have a drink containing alcohol? 3  2. How many drinks containing alcohol do you have on a typical day when you are drinking? 0  3. How often do you have six or more drinks on one occasion? 0  AUDIT-C Score 3  4. How often during the last year have you found that you were not able to stop drinking once you had started? 0  5. How often during the last year have you failed to do what was normally expected from you because of drinking? 0  6. How often during the last year have you needed a first drink in the morning to get yourself going after a heavy drinking session? 0  7. How often during the last year have you had a feeling of guilt of remorse after drinking? 0  8. How often during the last year have you been unable to remember what happened the night before because you had been drinking? 0  9. Have you or someone else been injured as a result of your drinking? 0  10. Has a relative or friend or a doctor or another health worker been concerned about your drinking or suggested you cut down? 0  Alcohol Use Disorder Identification Test Final Score (AUDIT) 3  Alcohol Brief Interventions/Follow-up AUDIT Score <7 follow-up not indicated   A score of 3 or more in women, and 4 or more in men indicates increased risk for alcohol abuse,  EXCEPT if all of the points are from question 1   No results found for any visits on 06/30/20.  Assessment & Plan     Annual wellness visit done today including the all of the following: Reviewed patient's Family Medical History Reviewed and updated list of patient's  medical providers Assessment of cognitive impairment was done Assessed patient's functional ability Established a written schedule for health screening [place] Completed and Reviewed  Exercise Activities and Dietary recommendations Goals   None     Immunization History  Administered Date(s) Administered  . Influenza, High Dose Seasonal PF 04/15/2016  . Influenza-Unspecified 05/25/2017, 05/09/2018  . PFIZER SARS-COV-2 Vaccination 09/08/2019, 10/02/2019  . Pneumococcal Conjugate-13 06/08/2016  . Pneumococcal Polysaccharide-23 06/30/2017  . Zoster 05/19/2011  . Zoster Recombinat (Shingrix) 04/17/2018, 07/13/2018    Health Maintenance  Topic Date Due  . TETANUS/TDAP  Never done  . COLONOSCOPY  10/05/2019  . INFLUENZA VACCINE  02/24/2020  . COVID-19 Vaccine  Completed  . Hepatitis C Screening  Completed  . PNA vac Low Risk Adult  Completed     Discussed health benefits of physical activity, and encouraged him to engage in regular exercise appropriate for his age and condition.   1. Encounter for Medicare annual wellness exam  - Lipid panel - TSH - CBC w/Diff/Platelet - Comprehensive Metabolic Panel (CMET)  2. Essential hypertension Good control - Lipid panel - TSH - CBC w/Diff/Platelet - Comprehensive Metabolic Panel (CMET) - lisinopril (ZESTRIL) 20 MG tablet; Take 1 tablet (20 mg total) by mouth daily.  Dispense: 90 tablet; Refill: 4  3. Pure hypercholesterolemia  - Lipid panel - TSH - CBC w/Diff/Platelet - Comprehensive Metabolic Panel (CMET)  4. History of prostate cancer Follow-up PSA - PSA  5. Screening for colon cancer Time for colonoscopy.  Refer back to GI  along with consideration of reflux the patient has occasionally. - Ambulatory referral to Gastroenterology  6. Primary osteoarthritis involving multiple joints That is post left total shoulder replacement  7. Lumbar radiculopathy Clinically stable    No follow-ups on file.        Laverne Klugh Cranford Mon, MD  [facility] [phone] (phone) [phone] (fax)  [place]

## 2020-07-01 LAB — COMPREHENSIVE METABOLIC PANEL
ALT: 8 IU/L (ref 0–44)
AST: 12 IU/L (ref 0–40)
Albumin/Globulin Ratio: 1.4 (ref 1.2–2.2)
Albumin: 4.3 g/dL (ref 3.7–4.7)
Alkaline Phosphatase: 82 IU/L (ref 44–121)
BUN/Creatinine Ratio: 19 (ref 10–24)
BUN: 18 mg/dL (ref 8–27)
Bilirubin Total: 0.3 mg/dL (ref 0.0–1.2)
CO2: 21 mmol/L (ref 20–29)
Calcium: 9.6 mg/dL (ref 8.6–10.2)
Chloride: 99 mmol/L (ref 96–106)
Creatinine, Ser: 0.96 mg/dL (ref 0.76–1.27)
GFR calc Af Amer: 92 mL/min/{1.73_m2} (ref >59)
GFR calc non Af Amer: 79 mL/min/{1.73_m2} (ref >59)
Globulin, Total: 3 g/dL (ref 1.5–4.5)
Glucose: 87 mg/dL (ref 65–99)
Potassium: 4.8 mmol/L (ref 3.5–5.2)
Sodium: 137 mmol/L (ref 134–144)
Total Protein: 7.3 g/dL (ref 6.0–8.5)

## 2020-07-01 LAB — LIPID PANEL
Chol/HDL Ratio: 4 ratio (ref 0.0–5.0)
Cholesterol, Total: 194 mg/dL (ref 100–199)
HDL: 48 mg/dL (ref >39)
LDL Chol Calc (NIH): 123 mg/dL — ABNORMAL HIGH (ref 0–99)
Triglycerides: 126 mg/dL (ref 0–149)
VLDL Cholesterol Cal: 23 mg/dL (ref 5–40)

## 2020-07-01 LAB — CBC WITH DIFFERENTIAL/PLATELET
Basophils Absolute: 0.1 10*3/uL (ref 0.0–0.2)
Basos: 0 %
EOS (ABSOLUTE): 0.3 10*3/uL (ref 0.0–0.4)
Eos: 2 %
Hematocrit: 40 % (ref 37.5–51.0)
Hemoglobin: 13.6 g/dL (ref 13.0–17.7)
Immature Grans (Abs): 0.1 10*3/uL (ref 0.0–0.1)
Immature Granulocytes: 1 %
Lymphocytes Absolute: 2.5 10*3/uL (ref 0.7–3.1)
Lymphs: 21 %
MCH: 32.1 pg (ref 26.6–33.0)
MCHC: 34 g/dL (ref 31.5–35.7)
MCV: 94 fL (ref 79–97)
Monocytes Absolute: 1.4 10*3/uL — ABNORMAL HIGH (ref 0.1–0.9)
Monocytes: 12 %
Neutrophils Absolute: 7.7 10*3/uL — ABNORMAL HIGH (ref 1.4–7.0)
Neutrophils: 64 %
Platelets: 253 10*3/uL (ref 150–450)
RBC: 4.24 x10E6/uL (ref 4.14–5.80)
RDW: 11.7 % (ref 11.6–15.4)
WBC: 12.1 10*3/uL — ABNORMAL HIGH (ref 3.4–10.8)

## 2020-07-01 LAB — PSA: Prostate Specific Ag, Serum: <0.1 ng/mL (ref 0.0–4.0)

## 2020-07-01 LAB — TSH: TSH: 1.64 u[IU]/mL (ref 0.450–4.500)

## 2020-07-02 DIAGNOSIS — Z96612 Presence of left artificial shoulder joint: Secondary | ICD-10-CM | POA: Diagnosis not present

## 2020-07-03 ENCOUNTER — Telehealth: Payer: Self-pay

## 2020-07-03 NOTE — Telephone Encounter (Signed)
Patient had labs done

## 2020-07-03 NOTE — Telephone Encounter (Signed)
Copied from [place] [id number]. Topic: Appointment Scheduling - Scheduling Inquiry for Clinic >> Jul 03, 2020 10:13 AM Oneta Rack wrote: Reason for CRM: patient inquiring about lab results and would like a printed copy please advise when ready for pick up. Also, patient would like to [facility] Booster appointment.

## 2020-07-07 ENCOUNTER — Telehealth: Payer: Self-pay

## 2020-07-07 DIAGNOSIS — Z96612 Presence of left artificial shoulder joint: Secondary | ICD-10-CM | POA: Diagnosis not present

## 2020-07-07 NOTE — Telephone Encounter (Signed)
Copied from [place] [id number]. Topic: General - Other >> Jul 07, 2020  9:16 AM Keene Breath wrote: Reason for CRM: Patient called to inform the office that he will come by and pick up a copy of his lab results.  He stated that the nurse was supposed to call him to go over the results.  Please advise at [phone]

## 2020-07-08 DIAGNOSIS — H539 Unspecified visual disturbance: Secondary | ICD-10-CM | POA: Diagnosis not present

## 2020-07-08 DIAGNOSIS — Z736 Limitation of activities due to disability: Secondary | ICD-10-CM | POA: Diagnosis not present

## 2020-07-08 DIAGNOSIS — H4010X3 Unspecified open-angle glaucoma, severe stage: Secondary | ICD-10-CM | POA: Diagnosis not present

## 2020-07-08 NOTE — Telephone Encounter (Signed)
Patient advised as below.  

## 2020-07-08 NOTE — Telephone Encounter (Signed)
Reviewed labs with patient. °

## 2020-07-11 DIAGNOSIS — Z96612 Presence of left artificial shoulder joint: Secondary | ICD-10-CM | POA: Diagnosis not present

## 2020-07-15 DIAGNOSIS — Z96612 Presence of left artificial shoulder joint: Secondary | ICD-10-CM | POA: Diagnosis not present

## 2020-07-17 DIAGNOSIS — Z96612 Presence of left artificial shoulder joint: Secondary | ICD-10-CM | POA: Diagnosis not present

## 2020-07-21 ENCOUNTER — Telehealth: Payer: Self-pay

## 2020-07-21 NOTE — Telephone Encounter (Signed)
Copied from CRM [id number]. Topic: Appointment Scheduling - Scheduling Inquiry for Clinic >> Jul 21, 2020 10:41 AM Dalphine Handing A wrote: Patient stated he was on hold for 13 minutes and then was hung up on. Patient called back to reschedule his covid booster appt and has asked that Okey Regal call him back after 9am tomorrow to reschedule his appt for 12/29

## 2020-07-22 DIAGNOSIS — Z96612 Presence of left artificial shoulder joint: Secondary | ICD-10-CM | POA: Diagnosis not present

## 2020-07-23 ENCOUNTER — Other Ambulatory Visit: Payer: Self-pay | Admitting: Adult Health

## 2020-07-23 ENCOUNTER — Ambulatory Visit (INDEPENDENT_AMBULATORY_CARE_PROVIDER_SITE_OTHER): Payer: Medicare Other

## 2020-07-23 ENCOUNTER — Other Ambulatory Visit: Payer: Self-pay

## 2020-07-23 DIAGNOSIS — Z1152 Encounter for screening for COVID-19: Secondary | ICD-10-CM | POA: Insufficient documentation

## 2020-07-23 DIAGNOSIS — Z23 Encounter for immunization: Secondary | ICD-10-CM | POA: Diagnosis not present

## 2020-07-23 NOTE — Progress Notes (Signed)
Needs for travel came to office and told Okey Regal.   Orders Placed This Encounter  Procedures  . COVID-19, Flu A+B and RSV    Encounter for screening for COVID-19 - Plan: COVID-19, Flu A+B and RSV  Patient not seen and denied any symptoms.

## 2020-07-24 DIAGNOSIS — Z96612 Presence of left artificial shoulder joint: Secondary | ICD-10-CM | POA: Diagnosis not present

## 2020-07-28 ENCOUNTER — Telehealth (INDEPENDENT_AMBULATORY_CARE_PROVIDER_SITE_OTHER): Payer: Medicare Other | Admitting: Family Medicine

## 2020-07-28 ENCOUNTER — Other Ambulatory Visit: Payer: Self-pay

## 2020-07-28 ENCOUNTER — Ambulatory Visit
Admission: RE | Admit: 2020-07-28 | Discharge: 2020-07-28 | Disposition: A | Discharge status: Home / Self Care | Payer: Medicare Other | Source: Ambulatory Visit | Attending: Family Medicine | Admitting: Family Medicine

## 2020-07-28 ENCOUNTER — Ambulatory Visit
Admission: RE | Admit: 2020-07-28 | Discharge: 2020-07-28 | Disposition: A | Discharge status: Home / Self Care | Payer: Medicare Other | Attending: Family Medicine | Admitting: Family Medicine

## 2020-07-28 DIAGNOSIS — R61 Generalized hyperhidrosis: Secondary | ICD-10-CM | POA: Diagnosis not present

## 2020-07-28 DIAGNOSIS — R059 Cough, unspecified: Secondary | ICD-10-CM | POA: Insufficient documentation

## 2020-07-28 DIAGNOSIS — R509 Fever, unspecified: Secondary | ICD-10-CM | POA: Diagnosis not present

## 2020-07-28 MED ORDER — AZITHROMYCIN 250 MG PO TABS: ORAL_TABLET | ORAL | 0 refills | Status: DC

## 2020-07-28 NOTE — Progress Notes (Signed)
Virtual Phone Visit    Virtual Visit via Video Note   This visit type was conducted due to national recommendations for restrictions regarding the COVID-19 Pandemic (e.g. social distancing) in an effort to limit this patient's exposure and mitigate transmission in our community. This patient is at least at moderate risk for complications without adequate follow up. This format is felt to be most appropriate for this patient at this time. Physical exam was limited by quality of the video and audio technology used for the visit.   Patient location: home Provider location: office  I discussed the limitations of evaluation and management by telemedicine and the availability of in person appointments. The patient expressed understanding and agreed to proceed.  Patient: Dennis Steele   DOB: [birth year]   70 y.o. Male  MRN: [id number] Visit Date: 07/28/2020  Today's healthcare provider: [name], MD   No chief complaint on file.  Subjective    HPI  Patient is a 70 year old male who presents today via phone visit for evaluation of Covid like symptoms.  Patient got his booster vaccine last week.  Since that time he has had runny nose, cough and fever.   He did a home Covid test yesterday and it was negative.  He plans on going out of the country on Friday and doesn't want to get stuck somewhere and have his symptoms worsen.   Booster 7 days ago.  He started having cough 4 to 5 days ago but also has had night sweats for the past month or so.  No hemoptysis no weight loss.     Medications: Outpatient Medications Prior to Visit  Medication Sig  . azelastine (ASTELIN) 0.1 % nasal spray Place 2 sprays into both nostrils 2 (two) times daily. Use in each nostril as directed  . calcium carbonate (TUMS - DOSED IN MG ELEMENTAL CALCIUM) 500 MG chewable tablet Chew 1 tablet by mouth daily.  . fluconazole (DIFLUCAN) 200 MG tablet Take 1 tablet (200 mg total) by mouth as directed. Take 1 PO on  Tuesday, Thursday and Saturday  . lisinopril (ZESTRIL) 20 MG tablet Take 1 tablet (20 mg total) by mouth daily.  . meloxicam (MOBIC) 7.5 MG tablet Take 1 tablet (7.5 mg total) by mouth 2 (two) times daily as needed for pain.  [name] Kitchen MULTIPLE VITAMIN PO Take by mouth.  . tadalafil (CIALIS) 5 MG tablet Take by mouth.  . timolol (TIMOPTIC) 0.25 % ophthalmic solution Apply to eye.  . Travoprost, BAK Free, (TRAVATAN Z) 0.004 % SOLN ophthalmic solution Apply to eye.  . triamcinolone cream (KENALOG) 0.1 % Apply 1 application topically 3 (three) times daily. To insect bites  . Turmeric (QC TUMERIC COMPLEX PO) Take by mouth.  . valACYclovir (VALTREX) 1000 MG tablet Take 1,000 mg by mouth as needed. Only for outbreaks of fever blisters   No facility-administered medications prior to visit.    Review of Systems  Constitutional: Positive for fever.  HENT: Positive for rhinorrhea and sneezing. Negative for sinus pain and sore throat.   Respiratory: Positive for cough. Negative for chest tightness, shortness of breath and wheezing.   Cardiovascular: Negative for chest pain.  Gastrointestinal: Negative for diarrhea and nausea.  Musculoskeletal: Positive for myalgias.  Neurological: Negative for dizziness and headaches.       Objective    There were no vitals taken for this visit.    Physical Exam  Patient is able to speak in full sentences and has no  obvious shortness of breath at all no audible wheezing.   Assessment & Plan     1. Cough Patient is vaccinated but must obtain negative Covid test. - DG Chest 2 View; Future  2. Night sweats Due to the night sweats we will treat this is infection with a Z-Pak.  He is getting ready to go to the [place] for vacation.  Otherwise would use doxycycline but will try to avoid photosensitivity.  May need further work-up of the nights it wets persist. - DG Chest 2 View; Future   No follow-ups on file.     I discussed the assessment and treatment  plan with the patient. The patient was provided an opportunity to ask questions and all were answered. The patient agreed with the plan and demonstrated an understanding of the instructions.   The patient was advised to call back or seek an in-person evaluation if the symptoms worsen or if the condition fails to improve as anticipated.  I provided 13 minutes of non-face-to-face time during this encounter.    Sayvion Vigen Wendelyn Breslow, MD [facility] [phone] (phone) [phone] (fax)  [facility]

## 2020-07-30 ENCOUNTER — Telehealth: Payer: Self-pay

## 2020-07-30 DIAGNOSIS — M5416 Radiculopathy, lumbar region: Secondary | ICD-10-CM | POA: Diagnosis not present

## 2020-07-30 DIAGNOSIS — M9901 Segmental and somatic dysfunction of cervical region: Secondary | ICD-10-CM | POA: Diagnosis not present

## 2020-07-30 DIAGNOSIS — M9903 Segmental and somatic dysfunction of lumbar region: Secondary | ICD-10-CM | POA: Diagnosis not present

## 2020-07-30 DIAGNOSIS — M5033 Other cervical disc degeneration, cervicothoracic region: Secondary | ICD-10-CM | POA: Diagnosis not present

## 2020-07-30 NOTE — Telephone Encounter (Signed)
Pt called and messaged regarding normal CXR has been received.

## 2020-07-30 NOTE — Telephone Encounter (Signed)
-----   Message from Maple Hudson., MD sent at 07/29/2020  9:04 AM EST ----- CXR normal.

## 2020-08-12 ENCOUNTER — Encounter: Payer: Medicare Other | Admitting: Family Medicine

## 2020-08-12 DIAGNOSIS — Z96612 Presence of left artificial shoulder joint: Secondary | ICD-10-CM | POA: Diagnosis not present

## 2020-08-14 DIAGNOSIS — Z96612 Presence of left artificial shoulder joint: Secondary | ICD-10-CM | POA: Diagnosis not present

## 2020-08-18 DIAGNOSIS — Z96612 Presence of left artificial shoulder joint: Secondary | ICD-10-CM | POA: Diagnosis not present

## 2020-08-20 DIAGNOSIS — M5136 Other intervertebral disc degeneration, lumbar region: Secondary | ICD-10-CM | POA: Diagnosis not present

## 2020-08-20 DIAGNOSIS — M9903 Segmental and somatic dysfunction of lumbar region: Secondary | ICD-10-CM | POA: Diagnosis not present

## 2020-08-20 DIAGNOSIS — M62830 Muscle spasm of back: Secondary | ICD-10-CM | POA: Diagnosis not present

## 2020-08-20 DIAGNOSIS — M9902 Segmental and somatic dysfunction of thoracic region: Secondary | ICD-10-CM | POA: Diagnosis not present

## 2020-08-21 DIAGNOSIS — Z96612 Presence of left artificial shoulder joint: Secondary | ICD-10-CM | POA: Diagnosis not present

## 2020-08-26 DIAGNOSIS — Z96612 Presence of left artificial shoulder joint: Secondary | ICD-10-CM | POA: Diagnosis not present

## 2020-08-27 DIAGNOSIS — M5136 Other intervertebral disc degeneration, lumbar region: Secondary | ICD-10-CM | POA: Diagnosis not present

## 2020-08-27 DIAGNOSIS — M9903 Segmental and somatic dysfunction of lumbar region: Secondary | ICD-10-CM | POA: Diagnosis not present

## 2020-08-27 DIAGNOSIS — M9902 Segmental and somatic dysfunction of thoracic region: Secondary | ICD-10-CM | POA: Diagnosis not present

## 2020-08-27 DIAGNOSIS — M62830 Muscle spasm of back: Secondary | ICD-10-CM | POA: Diagnosis not present

## 2020-08-28 DIAGNOSIS — Z96612 Presence of left artificial shoulder joint: Secondary | ICD-10-CM | POA: Diagnosis not present

## 2020-09-01 ENCOUNTER — Other Ambulatory Visit: Payer: Self-pay

## 2020-09-01 ENCOUNTER — Ambulatory Visit (INDEPENDENT_AMBULATORY_CARE_PROVIDER_SITE_OTHER): Payer: Medicare Other | Admitting: Dermatology

## 2020-09-01 DIAGNOSIS — D229 Melanocytic nevi, unspecified: Secondary | ICD-10-CM

## 2020-09-01 DIAGNOSIS — L570 Actinic keratosis: Secondary | ICD-10-CM | POA: Diagnosis not present

## 2020-09-01 DIAGNOSIS — Z86018 Personal history of other benign neoplasm: Secondary | ICD-10-CM

## 2020-09-01 DIAGNOSIS — B009 Herpesviral infection, unspecified: Secondary | ICD-10-CM

## 2020-09-01 DIAGNOSIS — L821 Other seborrheic keratosis: Secondary | ICD-10-CM | POA: Diagnosis not present

## 2020-09-01 DIAGNOSIS — L814 Other melanin hyperpigmentation: Secondary | ICD-10-CM | POA: Diagnosis not present

## 2020-09-01 DIAGNOSIS — Z1283 Encounter for screening for malignant neoplasm of skin: Secondary | ICD-10-CM

## 2020-09-01 DIAGNOSIS — Z85828 Personal history of other malignant neoplasm of skin: Secondary | ICD-10-CM | POA: Diagnosis not present

## 2020-09-01 DIAGNOSIS — D1800 Hemangioma unspecified site: Secondary | ICD-10-CM

## 2020-09-01 DIAGNOSIS — L578 Other skin changes due to chronic exposure to nonionizing radiation: Secondary | ICD-10-CM | POA: Diagnosis not present

## 2020-09-01 MED ORDER — VALACYCLOVIR HCL 1 G PO TABS: ORAL_TABLET | ORAL | 11 refills | Status: DC

## 2020-09-01 NOTE — Progress Notes (Unsigned)
Follow-Up Visit   Subjective  Dennis Steele is a 70 y.o. male who presents for the following: Annual Exam (Hx SCC, BCC, AK's, dysplastic nevi). The patient presents for Total-Body Skin Exam (TBSE) for skin cancer screening and mole check.  The following portions of the chart were reviewed this encounter and updated as appropriate:   Tobacco  Allergies  Meds  Problems  Med Hx  Surg Hx  Fam Hx     Review of Systems:  No other skin or systemic complaints except as noted in HPI or Assessment and Plan.  Objective  Well appearing patient in no apparent distress; mood and affect are within normal limits.  A full examination was performed including scalp, head, eyes, ears, nose, lips, neck, chest, axillae, abdomen, back, buttocks, bilateral upper extremities, bilateral lower extremities, hands, feet, fingers, toes, fingernails, and toenails. All findings within normal limits unless otherwise noted below.  Objective  Face: Erythematous thin papules/macules with gritty scale.   Objective  Lips: Clear   Assessment & Plan  AK (actinic keratosis) Face Destruction of lesion - Face Complexity: simple   Destruction method: cryotherapy   Informed consent: discussed and consent obtained   Timeout:  patient name, date of birth, surgical site, and procedure verified Lesion destroyed using liquid nitrogen: Yes   Region frozen until ice ball extended beyond lesion: Yes   Outcome: patient tolerated procedure well with no complications   Post-procedure details: wound care instructions given    Herpes simplex virus (HSV) infection Lips History of HSV - Continue Valtrex 1g take 2 tabs at onset of outbreak then 69more tabs 12 hours later.  Herpes Simplex Virus = Cold Sores = Fever Blisters is a chronic recurring blistering; scabbing sore-producing viral infection that is recurrent usually in the same area triggered by stress, sun/UV exposure and trauma.  It is infectious and can be spread from  person to person by direct contact.  It is not curable, but is treatable with topical and oral medication.  valACYclovir (VALTREX) 1000 MG tablet - Lips   Lentigines - Scattered tan macules - Discussed due to sun exposure - Benign, observe - Call for any changes  Seborrheic Keratoses - Stuck-on, waxy, tan-brown papules and plaques  - Discussed benign etiology and prognosis. - Observe - Call for any changes  Melanocytic Nevi - Tan-brown and/or pink-flesh-colored symmetric macules and papules - Benign appearing on exam today - Observation - Call clinic for new or changing moles - Recommend daily use of broad spectrum spf 30+ sunscreen to sun-exposed areas.   Hemangiomas - Red papules - Discussed benign nature - Observe - Call for any changes  Actinic Damage - Chronic, secondary to cumulative UV/sun exposure - diffuse scaly erythematous macules with underlying dyspigmentation - Recommend daily broad spectrum sunscreen SPF 30+ to sun-exposed areas, reapply every 2 hours as needed.  - Call for new or changing lesions.  History of Basal Cell Carcinoma of the Skin - No evidence of recurrence today - Recommend regular full body skin exams - Recommend daily broad spectrum sunscreen SPF 30+ to sun-exposed areas, reapply every 2 hours as needed.  - Call if any new or changing lesions are noted between office visits  History of Squamous Cell Carcinoma of the Skin - No evidence of recurrence today - No lymphadenopathy - Recommend regular full body skin exams - Recommend daily broad spectrum sunscreen SPF 30+ to sun-exposed areas, reapply every 2 hours as needed.  - Call if any new or changing  lesions are noted between office visits  History of Dysplastic Nevi - No evidence of recurrence today - Recommend regular full body skin exams - Recommend daily broad spectrum sunscreen SPF 30+ to sun-exposed areas, reapply every 2 hours as needed.  - Call if any new or changing lesions  are noted between office visits  Skin cancer screening performed today.  Return in about 1 year (around 09/01/2021) for TBSE.  Luther Redo, CMA, am acting as scribe for Sarina Ser, MD .  Documentation: I have reviewed the above documentation for accuracy and completeness, and I agree with the above.  Sarina Ser, MD

## 2020-09-02 DIAGNOSIS — Z96612 Presence of left artificial shoulder joint: Secondary | ICD-10-CM | POA: Diagnosis not present

## 2020-09-04 ENCOUNTER — Encounter: Payer: Self-pay | Admitting: Dermatology

## 2020-09-04 DIAGNOSIS — Z96612 Presence of left artificial shoulder joint: Secondary | ICD-10-CM | POA: Diagnosis not present

## 2020-09-08 DIAGNOSIS — Z96612 Presence of left artificial shoulder joint: Secondary | ICD-10-CM | POA: Diagnosis not present

## 2020-09-10 DIAGNOSIS — Z96612 Presence of left artificial shoulder joint: Secondary | ICD-10-CM | POA: Diagnosis not present

## 2020-09-16 DIAGNOSIS — Z96612 Presence of left artificial shoulder joint: Secondary | ICD-10-CM | POA: Diagnosis not present

## 2020-09-17 DIAGNOSIS — M62830 Muscle spasm of back: Secondary | ICD-10-CM | POA: Diagnosis not present

## 2020-09-17 DIAGNOSIS — M9903 Segmental and somatic dysfunction of lumbar region: Secondary | ICD-10-CM | POA: Diagnosis not present

## 2020-09-17 DIAGNOSIS — M5136 Other intervertebral disc degeneration, lumbar region: Secondary | ICD-10-CM | POA: Diagnosis not present

## 2020-09-17 DIAGNOSIS — M9902 Segmental and somatic dysfunction of thoracic region: Secondary | ICD-10-CM | POA: Diagnosis not present

## 2020-09-18 DIAGNOSIS — Z96612 Presence of left artificial shoulder joint: Secondary | ICD-10-CM | POA: Diagnosis not present

## 2020-09-24 DIAGNOSIS — Z96612 Presence of left artificial shoulder joint: Secondary | ICD-10-CM | POA: Diagnosis not present

## 2020-09-30 DIAGNOSIS — K219 Gastro-esophageal reflux disease without esophagitis: Secondary | ICD-10-CM | POA: Diagnosis not present

## 2020-09-30 DIAGNOSIS — Z800 Family history of malignant neoplasm of digestive organs: Secondary | ICD-10-CM | POA: Diagnosis not present

## 2020-09-30 DIAGNOSIS — Z86010 Personal history of colonic polyps: Secondary | ICD-10-CM | POA: Diagnosis not present

## 2020-09-30 DIAGNOSIS — Z96612 Presence of left artificial shoulder joint: Secondary | ICD-10-CM | POA: Diagnosis not present

## 2020-10-02 DIAGNOSIS — Z96612 Presence of left artificial shoulder joint: Secondary | ICD-10-CM | POA: Diagnosis not present

## 2020-10-07 DIAGNOSIS — Z96612 Presence of left artificial shoulder joint: Secondary | ICD-10-CM | POA: Diagnosis not present

## 2020-10-09 DIAGNOSIS — Z96612 Presence of left artificial shoulder joint: Secondary | ICD-10-CM | POA: Diagnosis not present

## 2020-10-13 DIAGNOSIS — Z96612 Presence of left artificial shoulder joint: Secondary | ICD-10-CM | POA: Diagnosis not present

## 2020-10-14 DIAGNOSIS — Z96612 Presence of left artificial shoulder joint: Secondary | ICD-10-CM | POA: Diagnosis not present

## 2020-10-15 DIAGNOSIS — M5136 Other intervertebral disc degeneration, lumbar region: Secondary | ICD-10-CM | POA: Diagnosis not present

## 2020-10-15 DIAGNOSIS — M62830 Muscle spasm of back: Secondary | ICD-10-CM | POA: Diagnosis not present

## 2020-10-15 DIAGNOSIS — M9903 Segmental and somatic dysfunction of lumbar region: Secondary | ICD-10-CM | POA: Diagnosis not present

## 2020-10-15 DIAGNOSIS — M9902 Segmental and somatic dysfunction of thoracic region: Secondary | ICD-10-CM | POA: Diagnosis not present

## 2020-10-16 DIAGNOSIS — Z96612 Presence of left artificial shoulder joint: Secondary | ICD-10-CM | POA: Diagnosis not present

## 2020-10-21 DIAGNOSIS — Z96612 Presence of left artificial shoulder joint: Secondary | ICD-10-CM | POA: Diagnosis not present

## 2020-10-23 DIAGNOSIS — Z96612 Presence of left artificial shoulder joint: Secondary | ICD-10-CM | POA: Diagnosis not present

## 2020-10-27 DIAGNOSIS — H401321 Pigmentary glaucoma, left eye, mild stage: Secondary | ICD-10-CM | POA: Diagnosis not present

## 2020-10-27 DIAGNOSIS — H4010X3 Unspecified open-angle glaucoma, severe stage: Secondary | ICD-10-CM | POA: Diagnosis not present

## 2020-10-28 DIAGNOSIS — Z96612 Presence of left artificial shoulder joint: Secondary | ICD-10-CM | POA: Diagnosis not present

## 2020-10-29 ENCOUNTER — Other Ambulatory Visit: Payer: Self-pay | Admitting: Family Medicine

## 2020-10-30 DIAGNOSIS — Z96612 Presence of left artificial shoulder joint: Secondary | ICD-10-CM | POA: Diagnosis not present

## 2020-11-04 DIAGNOSIS — Z96612 Presence of left artificial shoulder joint: Secondary | ICD-10-CM | POA: Diagnosis not present

## 2020-11-11 DIAGNOSIS — Z01818 Encounter for other preprocedural examination: Secondary | ICD-10-CM | POA: Diagnosis not present

## 2020-11-11 DIAGNOSIS — Z96612 Presence of left artificial shoulder joint: Secondary | ICD-10-CM | POA: Diagnosis not present

## 2020-11-12 DIAGNOSIS — M5136 Other intervertebral disc degeneration, lumbar region: Secondary | ICD-10-CM | POA: Diagnosis not present

## 2020-11-12 DIAGNOSIS — M62830 Muscle spasm of back: Secondary | ICD-10-CM | POA: Diagnosis not present

## 2020-11-12 DIAGNOSIS — M9903 Segmental and somatic dysfunction of lumbar region: Secondary | ICD-10-CM | POA: Diagnosis not present

## 2020-11-12 DIAGNOSIS — M9902 Segmental and somatic dysfunction of thoracic region: Secondary | ICD-10-CM | POA: Diagnosis not present

## 2020-11-13 DIAGNOSIS — K219 Gastro-esophageal reflux disease without esophagitis: Secondary | ICD-10-CM | POA: Diagnosis not present

## 2020-11-13 DIAGNOSIS — Z86010 Personal history of colonic polyps: Secondary | ICD-10-CM | POA: Diagnosis not present

## 2020-11-13 DIAGNOSIS — K317 Polyp of stomach and duodenum: Secondary | ICD-10-CM | POA: Diagnosis not present

## 2020-11-13 DIAGNOSIS — K2950 Unspecified chronic gastritis without bleeding: Secondary | ICD-10-CM | POA: Diagnosis not present

## 2020-11-13 DIAGNOSIS — Z800 Family history of malignant neoplasm of digestive organs: Secondary | ICD-10-CM | POA: Diagnosis not present

## 2020-11-13 DIAGNOSIS — K5730 Diverticulosis of large intestine without perforation or abscess without bleeding: Secondary | ICD-10-CM | POA: Diagnosis not present

## 2020-11-18 DIAGNOSIS — Z96612 Presence of left artificial shoulder joint: Secondary | ICD-10-CM | POA: Diagnosis not present

## 2020-11-24 DIAGNOSIS — S42024D Nondisplaced fracture of shaft of right clavicle, subsequent encounter for fracture with routine healing: Secondary | ICD-10-CM | POA: Diagnosis not present

## 2020-11-25 DIAGNOSIS — Z96612 Presence of left artificial shoulder joint: Secondary | ICD-10-CM | POA: Diagnosis not present

## 2020-12-10 DIAGNOSIS — M9902 Segmental and somatic dysfunction of thoracic region: Secondary | ICD-10-CM | POA: Diagnosis not present

## 2020-12-10 DIAGNOSIS — M5136 Other intervertebral disc degeneration, lumbar region: Secondary | ICD-10-CM | POA: Diagnosis not present

## 2020-12-10 DIAGNOSIS — M9903 Segmental and somatic dysfunction of lumbar region: Secondary | ICD-10-CM | POA: Diagnosis not present

## 2020-12-10 DIAGNOSIS — M62830 Muscle spasm of back: Secondary | ICD-10-CM | POA: Diagnosis not present

## 2020-12-26 DIAGNOSIS — M722 Plantar fascial fibromatosis: Secondary | ICD-10-CM | POA: Diagnosis not present

## 2021-01-14 DIAGNOSIS — M5136 Other intervertebral disc degeneration, lumbar region: Secondary | ICD-10-CM | POA: Diagnosis not present

## 2021-01-14 DIAGNOSIS — M9902 Segmental and somatic dysfunction of thoracic region: Secondary | ICD-10-CM | POA: Diagnosis not present

## 2021-01-14 DIAGNOSIS — M62830 Muscle spasm of back: Secondary | ICD-10-CM | POA: Diagnosis not present

## 2021-01-14 DIAGNOSIS — M9903 Segmental and somatic dysfunction of lumbar region: Secondary | ICD-10-CM | POA: Diagnosis not present

## 2021-01-27 ENCOUNTER — Other Ambulatory Visit: Payer: Self-pay | Admitting: Dermatology

## 2021-02-04 DIAGNOSIS — M62830 Muscle spasm of back: Secondary | ICD-10-CM | POA: Diagnosis not present

## 2021-02-04 DIAGNOSIS — M5136 Other intervertebral disc degeneration, lumbar region: Secondary | ICD-10-CM | POA: Diagnosis not present

## 2021-02-04 DIAGNOSIS — M9902 Segmental and somatic dysfunction of thoracic region: Secondary | ICD-10-CM | POA: Diagnosis not present

## 2021-02-04 DIAGNOSIS — M9903 Segmental and somatic dysfunction of lumbar region: Secondary | ICD-10-CM | POA: Diagnosis not present

## 2021-02-10 ENCOUNTER — Ambulatory Visit: Payer: Self-pay | Admitting: *Deleted

## 2021-02-10 DIAGNOSIS — J329 Chronic sinusitis, unspecified: Secondary | ICD-10-CM

## 2021-02-10 MED ORDER — AMOXICILLIN-POT CLAVULANATE 875-125 MG PO TABS: 1.0000 | ORAL_TABLET | Freq: Two times a day (BID) | ORAL | 0 refills | Status: DC

## 2021-02-10 MED ORDER — PREDNISONE 20 MG PO TABS: 20.0000 mg | ORAL_TABLET | Freq: Every day | ORAL | 0 refills | Status: AC

## 2021-02-10 NOTE — Telephone Encounter (Signed)
I returned pt's call.   He is c/o sinus congestion with a lot of post nasal drainage, blowing his nose a lot, sore throat, coughing-dry hacking cough that is worse at night, sweating, low grade fever for the last 3 days. He has not done a Covid test.   He is going to get home tests and call us back with the results.  He has stayed home from work and cancelled all his meetings in case he is contagious.   I let him know that was great that he did that.  He had Covid last fall and had the monoclonal antibody infusion.   He wanted to know if he is positive for Covid again if Dr. Rosanna Randy could order the infusion again.   I let him know about the antivirals that are available now but that Dr. Rosanna Randy could still order the infusion if he felt it was a better option.  There are no appts available with Dr. Rosanna Randy within the timeframe indicated on the protocol so I'm sending a note to [facility] to see if they can work him in for a video visit.  He is going to call us back with the home Covid test result in a few minutes.

## 2021-02-10 NOTE — Telephone Encounter (Signed)
Covid test negative.   Can Dr. Rosanna Randy call me in something for the congestion?   Do I need an antibiotic?  I am sending his Covid test result and his questions to [facility] for Dr. Rosanna Randy.

## 2021-02-10 NOTE — Telephone Encounter (Signed)
FYI. Please review. Thanks!  

## 2021-02-10 NOTE — Telephone Encounter (Signed)
Patient reports that his covid test was negative. Sent in Augmentin and prednisone into the pharmacy. Advised to call the office if not improving.

## 2021-02-10 NOTE — Telephone Encounter (Signed)
Reason for Disposition . [1] Sinus pain (not just congestion) AND [2] fever  Answer Assessment - Initial Assessment Questions 1. LOCATION: "Where does it hurt?"      Sore throat started Sat. Evening sinus drainage, nasal congestion, coughing a lot dry hacking, sweating, low grade fever.    2. ONSET: "When did the sinus pain start?"  (e.g., hours, days)      Saturday 3. SEVERITY: "How bad is the pain?"   (Scale 1-10; mild, moderate or severe)   - MILD (1-3): doesn't interfere with normal activities    - MODERATE (4-7): interferes with normal activities (e.g., work or school) or awakens from sleep   - SEVERE (8-10): excruciating pain and patient unable to do any normal activities        No headaches 4. RECURRENT SYMPTOM: "Have you ever had sinus problems before?" If Yes, ask: "When was the last time?" and "What happened that time?"      Yes  5. NASAL CONGESTION: "Is the nose blocked?" If Yes, ask: "Can you open it or must you breathe through your mouth?"     Yes 6. NASAL DISCHARGE: "Do you have discharge from your nose?" If so ask, "What color?"     Constant runny nose 7. FEVER: "Do you have a fever?" If Yes, ask: "What is it, how was it measured, and when did it start?"      Low grade fever   Taking Tylenol 8. OTHER SYMPTOMS: "Do you have any other symptoms?" (e.g., sore throat, cough, earache, difficulty breathing)     I'm using a nasal spray and Benadryl tablets and Mucinix DM that I took last night.    I'm coughing a lot during the night.   I took Mucinix all in one cough syrup.   9. PREGNANCY: "Is there any chance you are pregnant?" "When was your last menstrual period?"     N/A  Protocols used: Sinus Pain or Congestion-A-AH

## 2021-02-10 NOTE — Telephone Encounter (Signed)
Already addressed in another message. Patient advised of treatment plan.

## 2021-02-10 NOTE — Addendum Note (Signed)
Addended by: Wilburt Finlay on: 02/10/2021 04:36 PM   Modules accepted: Orders

## 2021-03-04 DIAGNOSIS — M9903 Segmental and somatic dysfunction of lumbar region: Secondary | ICD-10-CM | POA: Diagnosis not present

## 2021-03-04 DIAGNOSIS — M9902 Segmental and somatic dysfunction of thoracic region: Secondary | ICD-10-CM | POA: Diagnosis not present

## 2021-03-04 DIAGNOSIS — M5136 Other intervertebral disc degeneration, lumbar region: Secondary | ICD-10-CM | POA: Diagnosis not present

## 2021-03-04 DIAGNOSIS — M62830 Muscle spasm of back: Secondary | ICD-10-CM | POA: Diagnosis not present

## 2021-03-06 ENCOUNTER — Encounter: Payer: Self-pay | Admitting: Family Medicine

## 2021-03-06 ENCOUNTER — Telehealth (INDEPENDENT_AMBULATORY_CARE_PROVIDER_SITE_OTHER): Payer: Medicare Other | Admitting: Family Medicine

## 2021-03-06 ENCOUNTER — Other Ambulatory Visit: Payer: Self-pay

## 2021-03-06 DIAGNOSIS — H1013 Acute atopic conjunctivitis, bilateral: Secondary | ICD-10-CM

## 2021-03-06 DIAGNOSIS — J3489 Other specified disorders of nose and nasal sinuses: Secondary | ICD-10-CM | POA: Diagnosis not present

## 2021-03-06 DIAGNOSIS — R059 Cough, unspecified: Secondary | ICD-10-CM

## 2021-03-06 DIAGNOSIS — J029 Acute pharyngitis, unspecified: Secondary | ICD-10-CM | POA: Diagnosis not present

## 2021-03-06 DIAGNOSIS — R0982 Postnasal drip: Secondary | ICD-10-CM | POA: Diagnosis not present

## 2021-03-06 DIAGNOSIS — J329 Chronic sinusitis, unspecified: Secondary | ICD-10-CM | POA: Diagnosis not present

## 2021-03-06 DIAGNOSIS — J398 Other specified diseases of upper respiratory tract: Secondary | ICD-10-CM

## 2021-03-06 MED ORDER — FLUTICASONE PROPIONATE 50 MCG/ACT NA SUSP: 2.0000 | Freq: Every day | NASAL | 0 refills | Status: DC

## 2021-03-06 MED ORDER — LEVOFLOXACIN 500 MG PO TABS: 500.0000 mg | ORAL_TABLET | Freq: Every day | ORAL | 0 refills | Status: AC

## 2021-03-06 MED ORDER — BENZONATATE 200 MG PO CAPS: 200.0000 mg | ORAL_CAPSULE | Freq: Two times a day (BID) | ORAL | 0 refills | Status: DC | PRN

## 2021-03-06 MED ORDER — GUAIFENESIN-DM 100-10 MG/5ML PO SYRP: 5.0000 mL | ORAL_SOLUTION | ORAL | 0 refills | Status: DC | PRN

## 2021-03-06 MED ORDER — SALINE SPRAY 0.65 % NA SOLN: 1.0000 | NASAL | 0 refills | Status: DC | PRN

## 2021-03-06 MED ORDER — KETOTIFEN FUMARATE 0.025 % OP SOLN: 1.0000 [drp] | Freq: Two times a day (BID) | OPHTHALMIC | 0 refills | Status: DC

## 2021-03-06 MED ORDER — CETIRIZINE HCL 10 MG PO TABS: 10.0000 mg | ORAL_TABLET | Freq: Every day | ORAL | 0 refills | Status: DC

## 2021-03-06 MED ORDER — BENZOCAINE-MENTHOL 6-10 MG MT LOZG: 1.0000 | LOZENGE | OROMUCOSAL | 0 refills | Status: DC | PRN

## 2021-03-06 MED ORDER — AZELASTINE HCL 0.1 % NA SOLN: 2.0000 | Freq: Two times a day (BID) | NASAL | 0 refills | Status: DC

## 2021-03-06 NOTE — Assessment & Plan Note (Signed)
Originally treated 8/19; symptoms have returned  Hyponasal sounding voice  Other associated symptoms

## 2021-03-06 NOTE — Assessment & Plan Note (Signed)
4 day hx of white/clear output from eyes Worse in am Using wet towel to remove conjunctivae is reddened Pt with hx of glaucoma

## 2021-03-06 NOTE — Assessment & Plan Note (Signed)
Originally treated 8/19 -symptoms went away -have now returned -pt took all of prev abx -continue supportive therapies -advised change in abx -encouraged oral intake -report back if tx fails

## 2021-03-06 NOTE — Progress Notes (Signed)
Virtual telephone visit    Virtual Visit via Telephone Note   This visit type was conducted due to national recommendations for restrictions regarding the COVID-19 Pandemic (e.g. social distancing) in an effort to limit this patient's exposure and mitigate transmission in our community. Due to his co-morbid illnesses, this patient is at least at moderate risk for complications without adequate follow up. This format is felt to be most appropriate for this patient at this time. The patient did not have access to video technology or had technical difficulties with video requiring transitioning to audio format only (telephone). Physical exam was limited to content and character of the telephone converstion.    Patient location: Home Provider location: [facility]  I discussed the limitations of evaluation and management by telemedicine and the availability of in person appointments. The patient expressed understanding and agreed to proceed.   Visit Date: 03/06/2021  Today's healthcare provider: Gwyneth Sprout, FNP   Chief Complaint  Patient presents with   Sinus Problem   Subjective    Sinus Problem Associated symptoms include chills, congestion, coughing, diaphoresis, sinus pressure, sneezing and a sore throat. Pertinent negatives include no ear pain, headaches or shortness of breath.   Sinus Problem This is a new problem. The current episode started in the past 7 days. The problem has been gradually worsening since onset. There has been no fever. Associated symptoms include congestion, coughing (productive of yellow/green sputum), diaphoresis, a hoarse voice, sinus pressure, sneezing and a sore throat. Pertinent negatives include no chills, ear pain, headaches, neck pain, shortness of breath or swollen glands. Past treatments include acetaminophen and saline sprays (Patient reports he took a covid test at home 03/05/21 that was negative). The treatment provided no relief.        Medications: Outpatient Medications Prior to Visit  Medication Sig   calcium carbonate (TUMS - DOSED IN MG ELEMENTAL CALCIUM) 500 MG chewable tablet Chew 1 tablet by mouth daily.   latanoprost (XALATAN) 0.005 % ophthalmic solution Place 1 drop into the left eye at bedtime.   lisinopril (ZESTRIL) 20 MG tablet Take 1 tablet (20 mg total) by mouth daily.   MULTIPLE VITAMIN PO Take by mouth.   sildenafil (VIAGRA) 100 MG tablet Take 100 mg by mouth as needed.   timolol (TIMOPTIC) 0.25 % ophthalmic solution Apply to eye.   Travoprost, BAK Free, (TRAVATAN) 0.004 % SOLN ophthalmic solution Apply to eye.   Turmeric (QC TUMERIC COMPLEX PO) Take by mouth.   meloxicam (MOBIC) 7.5 MG tablet TAKE ONE TABLET BY MOUTH TWICE DAILY AS NEEDED FOR PAIN (Patient not taking: Reported on 03/06/2021)   tadalafil (CIALIS) 5 MG tablet Take by mouth. (Patient not taking: Reported on 03/06/2021)   valACYclovir (VALTREX) 1000 MG tablet Take 1,000 mg by mouth as needed. Only for outbreaks of fever blisters (Patient not taking: Reported on 03/06/2021)   valACYclovir (VALTREX) 1000 MG tablet For fever blisters - take 2 tabs at onset of outbreak then 2 more tabs 12 hours later. (Patient not taking: Reported on 03/06/2021)   valACYclovir (VALTREX) 1000 MG tablet TAKE 2 TABLETS AT ONSET OF SYMPTOMS AND 2 TABLETS 12 HOURS LATER (Patient not taking: Reported on 03/06/2021)   [DISCONTINUED] amoxicillin-clavulanate (AUGMENTIN) 875-125 MG tablet Take 1 tablet by mouth 2 (two) times daily.   [DISCONTINUED] azelastine (ASTELIN) 0.1 % nasal spray Place 2 sprays into both nostrils 2 (two) times daily. Use in each nostril as directed   [DISCONTINUED] azithromycin (ZITHROMAX) 250 MG tablet UAD   [  DISCONTINUED] fluconazole (DIFLUCAN) 200 MG tablet Take 1 tablet (200 mg total) by mouth as directed. Take 1 PO on Tuesday, Thursday and Saturday   [DISCONTINUED] triamcinolone cream (KENALOG) 0.1 % Apply 1 application topically 3 (three) times  daily. To insect bites   No facility-administered medications prior to visit.    Review of Systems  Constitutional:  Positive for chills, diaphoresis and fatigue. Negative for activity change, appetite change, fever and unexpected weight change.  HENT:  Positive for congestion, hearing loss, postnasal drip, sinus pressure, sinus pain, sneezing and sore throat. Negative for drooling, ear discharge, ear pain, facial swelling, nosebleeds, rhinorrhea, tinnitus, trouble swallowing and voice change.   Eyes:  Positive for discharge and redness. Negative for photophobia, pain, itching and visual disturbance.  Respiratory:  Positive for cough. Negative for choking, chest tightness, shortness of breath, wheezing and stridor.        Report productive via phone call  Cardiovascular: Negative.   Gastrointestinal:  Negative for diarrhea, nausea and vomiting.  Neurological:  Negative for dizziness and headaches.  Hematological: Negative.   Psychiatric/Behavioral: Negative.       Objective    There were no vitals taken for this visit.     Assessment & Plan     Problem List Items Addressed This Visit       Respiratory   Congestion of upper respiratory tract    Originally treated 8/19; symptoms have returned  Hyponasal sounding voice  Other associated symptoms      Relevant Medications   benzonatate (TESSALON) 200 MG capsule   cetirizine (ZYRTEC) 10 MG tablet   sodium chloride (OCEAN) 0.65 % SOLN nasal spray   azelastine (ASTELIN) 0.1 % nasal spray   fluticasone (FLONASE) 50 MCG/ACT nasal spray   Chronic sinusitis - Primary    Originally treated 8/19 -symptoms went away -have now returned -pt took all of prev abx -continue supportive therapies -advised change in abx -encouraged oral intake -report back if tx fails      Relevant Medications   benzonatate (TESSALON) 200 MG capsule   cetirizine (ZYRTEC) 10 MG tablet   sodium chloride (OCEAN) 0.65 % SOLN nasal spray    guaiFENesin-dextromethorphan (ROBITUSSIN DM) 100-10 MG/5ML syrup   levofloxacin (LEVAQUIN) 500 MG tablet   azelastine (ASTELIN) 0.1 % nasal spray   fluticasone (FLONASE) 50 MCG/ACT nasal spray     Other   Cough    Acute -negative for cough r/t smoking hx -productive -on going, worse at night -denies nausea, vomiting, appetite change      Relevant Medications   benzonatate (TESSALON) 200 MG capsule   guaiFENesin-dextromethorphan (ROBITUSSIN DM) 100-10 MG/5ML syrup   benzocaine-menthol (CHLORAEPTIC) 6-10 MG lozenge   Sinus pressure    Reports pressure in all sinus areas as pt was asked to check his sinuses for tenderness while on the call      Relevant Medications   cetirizine (ZYRTEC) 10 MG tablet   sodium chloride (OCEAN) 0.65 % SOLN nasal spray   levofloxacin (LEVAQUIN) 500 MG tablet   azelastine (ASTELIN) 0.1 % nasal spray   fluticasone (FLONASE) 50 MCG/ACT nasal spray   Allergic conjunctivitis, acute, bilateral    4 day hx of white/clear output from eyes Worse in am Using wet towel to remove conjunctivae is reddened Pt with hx of glaucoma      Relevant Medications   ketotifen (ZADITOR) 0.025 % ophthalmic solution   Post-nasal drip    Discussed importance of symptomatic therapy -addition of intra-nasal steroid -addition of  intra-nasal anti-histamine -use saline spray to assist with clearing passages prior to use of sprays to promote absorption      Relevant Medications   benzonatate (TESSALON) 200 MG capsule   cetirizine (ZYRTEC) 10 MG tablet   sodium chloride (OCEAN) 0.65 % SOLN nasal spray   guaiFENesin-dextromethorphan (ROBITUSSIN DM) 100-10 MG/5ML syrup   benzocaine-menthol (CHLORAEPTIC) 6-10 MG lozenge   azelastine (ASTELIN) 0.1 % nasal spray   fluticasone (FLONASE) 50 MCG/ACT nasal spray   Sore throat    Not reddened per pt report Related to runny nose and post-nasal drip  Promoted warm beverages and cough lozenges provided      Relevant Medications    benzonatate (TESSALON) 200 MG capsule   cetirizine (ZYRTEC) 10 MG tablet   sodium chloride (OCEAN) 0.65 % SOLN nasal spray   guaiFENesin-dextromethorphan (ROBITUSSIN DM) 100-10 MG/5ML syrup   benzocaine-menthol (CHLORAEPTIC) 6-10 MG lozenge   azelastine (ASTELIN) 0.1 % nasal spray   fluticasone (FLONASE) 50 MCG/ACT nasal spray     Return if symptoms worsen or fail to improve.    I discussed the assessment and treatment plan with the patient. The patient was provided an opportunity to ask questions and all were answered. The patient agreed with the plan and demonstrated an understanding of the instructions.   The patient was advised to call back or seek an in-person evaluation if the symptoms worsen or if the condition fails to improve as anticipated.  I provided 11 minutes of non-face-to-face time during this encounter.  Vonna Kotyk, FNP, have reviewed all documentation for this visit. The documentation on 03/06/21 for the exam, diagnosis, procedures, and orders are all accurate and complete.   Gwyneth Sprout, [place] [phone] (phone) [phone] (fax)  [place]

## 2021-03-06 NOTE — Assessment & Plan Note (Signed)
Discussed importance of symptomatic therapy -addition of intra-nasal steroid -addition of intra-nasal anti-histamine -use saline spray to assist with clearing passages prior to use of sprays to promote absorption

## 2021-03-06 NOTE — Assessment & Plan Note (Signed)
Not reddened per pt report Related to runny nose and post-nasal drip  Promoted warm beverages and cough lozenges provided

## 2021-03-06 NOTE — Assessment & Plan Note (Signed)
Acute -negative for cough r/t smoking hx -productive -on going, worse at night -denies nausea, vomiting, appetite change

## 2021-03-06 NOTE — Assessment & Plan Note (Signed)
Reports pressure in all sinus areas as pt was asked to check his sinuses for tenderness while on the call

## 2021-03-09 ENCOUNTER — Ambulatory Visit: Payer: Medicare Other | Admitting: Family Medicine

## 2021-03-30 DIAGNOSIS — Z20822 Contact with and (suspected) exposure to covid-19: Secondary | ICD-10-CM | POA: Diagnosis not present

## 2021-04-01 DIAGNOSIS — M9903 Segmental and somatic dysfunction of lumbar region: Secondary | ICD-10-CM | POA: Diagnosis not present

## 2021-04-01 DIAGNOSIS — M5136 Other intervertebral disc degeneration, lumbar region: Secondary | ICD-10-CM | POA: Diagnosis not present

## 2021-04-01 DIAGNOSIS — M9902 Segmental and somatic dysfunction of thoracic region: Secondary | ICD-10-CM | POA: Diagnosis not present

## 2021-04-01 DIAGNOSIS — M62830 Muscle spasm of back: Secondary | ICD-10-CM | POA: Diagnosis not present

## 2021-04-22 ENCOUNTER — Ambulatory Visit: Payer: Medicare Other | Admitting: Family Medicine

## 2021-04-27 ENCOUNTER — Other Ambulatory Visit: Payer: Self-pay | Admitting: Family Medicine

## 2021-04-27 DIAGNOSIS — H4010X3 Unspecified open-angle glaucoma, severe stage: Secondary | ICD-10-CM | POA: Diagnosis not present

## 2021-04-27 NOTE — Telephone Encounter (Signed)
Requested Prescriptions  Pending Prescriptions Disp Refills  . meloxicam (MOBIC) 7.5 MG tablet [Pharmacy Med Name: MELOXICAM 7.5 MG TAB] 60 tablet 3    Sig: TAKE ONE TABLET BY MOUTH TWICE DAILY AS NEEDED FOR PAIN     Analgesics:  COX2 Inhibitors Passed - 04/27/2021  9:14 AM      Passed - HGB in normal range and within 360 days    Hemoglobin  Date Value Ref Range Status  06/30/2020 13.6 13.0 - 17.7 g/dL Final         Passed - Cr in normal range and within 360 days    Creat  Date Value Ref Range Status  07/01/2017 0.84 0.70 - 1.25 mg/dL Final    Comment:    For patients >92 years of age, the reference limit for Creatinine is approximately 13% higher for people identified as African-American. .    Creatinine, Ser  Date Value Ref Range Status  06/30/2020 0.96 0.76 - 1.27 mg/dL Final         Passed - Patient is not pregnant      Passed - Valid encounter within last 12 months    Recent Outpatient Visits          1 month ago Chronic sinusitis, unspecified location   [facility] Tally Joe T, FNP   9 months ago Cough   [facility] Jerrol Banana., MD   10 months ago Encounter for [employer] annual wellness exam   [facility] Jerrol Banana., MD   2 years ago Cough   [facility] Jerrol Banana., MD   2 years ago Exposure to influenza   [facility], [place], [place]

## 2021-04-29 DIAGNOSIS — M5136 Other intervertebral disc degeneration, lumbar region: Secondary | ICD-10-CM | POA: Diagnosis not present

## 2021-04-29 DIAGNOSIS — M9902 Segmental and somatic dysfunction of thoracic region: Secondary | ICD-10-CM | POA: Diagnosis not present

## 2021-04-29 DIAGNOSIS — M62830 Muscle spasm of back: Secondary | ICD-10-CM | POA: Diagnosis not present

## 2021-04-29 DIAGNOSIS — M9903 Segmental and somatic dysfunction of lumbar region: Secondary | ICD-10-CM | POA: Diagnosis not present

## 2021-05-27 DIAGNOSIS — M9902 Segmental and somatic dysfunction of thoracic region: Secondary | ICD-10-CM | POA: Diagnosis not present

## 2021-05-27 DIAGNOSIS — M9903 Segmental and somatic dysfunction of lumbar region: Secondary | ICD-10-CM | POA: Diagnosis not present

## 2021-05-27 DIAGNOSIS — M5136 Other intervertebral disc degeneration, lumbar region: Secondary | ICD-10-CM | POA: Diagnosis not present

## 2021-05-27 DIAGNOSIS — M62830 Muscle spasm of back: Secondary | ICD-10-CM | POA: Diagnosis not present

## 2021-06-16 DIAGNOSIS — Z23 Encounter for immunization: Secondary | ICD-10-CM | POA: Diagnosis not present

## 2021-06-22 ENCOUNTER — Other Ambulatory Visit: Payer: Self-pay | Admitting: Family Medicine

## 2021-06-22 DIAGNOSIS — I10 Essential (primary) hypertension: Secondary | ICD-10-CM

## 2021-06-29 DIAGNOSIS — M5136 Other intervertebral disc degeneration, lumbar region: Secondary | ICD-10-CM | POA: Diagnosis not present

## 2021-06-29 DIAGNOSIS — M9902 Segmental and somatic dysfunction of thoracic region: Secondary | ICD-10-CM | POA: Diagnosis not present

## 2021-06-29 DIAGNOSIS — M62830 Muscle spasm of back: Secondary | ICD-10-CM | POA: Diagnosis not present

## 2021-06-29 DIAGNOSIS — M9903 Segmental and somatic dysfunction of lumbar region: Secondary | ICD-10-CM | POA: Diagnosis not present

## 2021-07-02 DIAGNOSIS — Z96612 Presence of left artificial shoulder joint: Secondary | ICD-10-CM | POA: Diagnosis not present

## 2021-07-22 DIAGNOSIS — M9902 Segmental and somatic dysfunction of thoracic region: Secondary | ICD-10-CM | POA: Diagnosis not present

## 2021-07-22 DIAGNOSIS — M5136 Other intervertebral disc degeneration, lumbar region: Secondary | ICD-10-CM | POA: Diagnosis not present

## 2021-07-22 DIAGNOSIS — M9903 Segmental and somatic dysfunction of lumbar region: Secondary | ICD-10-CM | POA: Diagnosis not present

## 2021-07-22 DIAGNOSIS — M62830 Muscle spasm of back: Secondary | ICD-10-CM | POA: Diagnosis not present

## 2021-08-24 ENCOUNTER — Encounter: Payer: Medicare Other | Admitting: Family Medicine

## 2021-08-26 ENCOUNTER — Ambulatory Visit (INDEPENDENT_AMBULATORY_CARE_PROVIDER_SITE_OTHER): Payer: Medicare Other | Admitting: Family Medicine

## 2021-08-26 ENCOUNTER — Other Ambulatory Visit: Payer: Self-pay

## 2021-08-26 ENCOUNTER — Encounter: Payer: Self-pay | Admitting: Family Medicine

## 2021-08-26 VITALS — BP 150/90 | HR 79 | Temp 98.2°F | Wt 210.0 lb

## 2021-08-26 DIAGNOSIS — I10 Essential (primary) hypertension: Secondary | ICD-10-CM | POA: Diagnosis not present

## 2021-08-26 DIAGNOSIS — H4010X0 Unspecified open-angle glaucoma, stage unspecified: Secondary | ICD-10-CM | POA: Diagnosis not present

## 2021-08-26 DIAGNOSIS — E7800 Pure hypercholesterolemia, unspecified: Secondary | ICD-10-CM | POA: Diagnosis not present

## 2021-08-26 DIAGNOSIS — F17299 Nicotine dependence, other tobacco product, with unspecified nicotine-induced disorders: Secondary | ICD-10-CM

## 2021-08-26 DIAGNOSIS — R61 Generalized hyperhidrosis: Secondary | ICD-10-CM

## 2021-08-26 DIAGNOSIS — Z0000 Encounter for general adult medical examination without abnormal findings: Secondary | ICD-10-CM

## 2021-08-26 DIAGNOSIS — Z136 Encounter for screening for cardiovascular disorders: Secondary | ICD-10-CM

## 2021-08-26 DIAGNOSIS — Z87891 Personal history of nicotine dependence: Secondary | ICD-10-CM

## 2021-08-26 DIAGNOSIS — E669 Obesity, unspecified: Secondary | ICD-10-CM

## 2021-08-26 DIAGNOSIS — E663 Overweight: Secondary | ICD-10-CM | POA: Diagnosis not present

## 2021-08-26 DIAGNOSIS — C61 Malignant neoplasm of prostate: Secondary | ICD-10-CM | POA: Diagnosis not present

## 2021-08-26 DIAGNOSIS — Z1159 Encounter for screening for other viral diseases: Secondary | ICD-10-CM | POA: Diagnosis not present

## 2021-08-26 DIAGNOSIS — Z114 Encounter for screening for human immunodeficiency virus [HIV]: Secondary | ICD-10-CM

## 2021-08-26 DIAGNOSIS — R739 Hyperglycemia, unspecified: Secondary | ICD-10-CM | POA: Diagnosis not present

## 2021-08-26 NOTE — Patient Instructions (Signed)
Preventive Care 65 Years and Older, Male °Preventive care refers to lifestyle choices and visits with your health care provider that can promote health and wellness. Preventive care visits are also called wellness exams. °What can I expect for my preventive care visit? °Counseling °During your preventive care visit, your health care provider may ask about your: °Medical history, including: °Past medical problems. °Family medical history. °History of falls. °Current health, including: °Emotional well-being. °Home life and relationship well-being. °Sexual activity. °Memory and ability to understand (cognition). °Lifestyle, including: °Alcohol, nicotine or tobacco, and drug use. °Access to firearms. °Diet, exercise, and sleep habits. °Work and work environment. °Sunscreen use. °Safety issues such as seatbelt and bike helmet use. °Physical exam °Your health care provider will check your: °Height and weight. These may be used to calculate your BMI (body mass index). BMI is a measurement that tells if you are at a healthy weight. °Waist circumference. This measures the distance around your waistline. This measurement also tells if you are at a healthy weight and may help predict your risk of certain diseases, such as type 2 diabetes and high blood pressure. °Heart rate and blood pressure. °Body temperature. °Skin for abnormal spots. °What immunizations do I need? °Vaccines are usually given at various ages, according to a schedule. Your health care provider will recommend vaccines for you based on your age, medical history, and lifestyle or other factors, such as travel or where you work. °What tests do I need? °Screening °Your health care provider may recommend screening tests for certain conditions. This may include: °Lipid and cholesterol levels. °Diabetes screening. This is done by checking your blood sugar (glucose) after you have not eaten for a while (fasting). °Hepatitis C test. °Hepatitis B test. °HIV (human  immunodeficiency virus) test. °STI (sexually transmitted infection) testing, if you are at risk. °Lung cancer screening. °Colorectal cancer screening. °Prostate cancer screening. °Abdominal aortic aneurysm (AAA) screening. You may need this if you are a current or former smoker. °Talk with your health care provider about your test results, treatment options, and if necessary, the need for more tests. °Follow these instructions at home: °Eating and drinking ° °Eat a diet that includes fresh fruits and vegetables, whole grains, lean protein, and low-fat dairy products. Limit your intake of foods with high amounts of sugar, saturated fats, and salt. °Take vitamin and mineral supplements as recommended by your health care provider. °Do not drink alcohol if your health care provider tells you not to drink. °If you drink alcohol: °Limit how much you have to 0-2 drinks a day. °Know how much alcohol is in your drink. In the [place] one drink equals one 12 oz bottle of beer (355 mL), one 5 oz glass of wine (148 mL), or one 1½ oz glass of hard liquor (44 mL). °Lifestyle °Brush your teeth every morning and night with fluoride toothpaste. Floss one time each day. °Exercise for at least 30 minutes 5 or more days each week. °Do not use any products that contain nicotine or tobacco. These products include cigarettes, chewing tobacco, and vaping devices, such as e-cigarettes. If you need help quitting, ask your health care provider. °Do not use drugs. °If you are sexually active, practice safe sex. Use a condom or other form of protection to prevent STIs. °Take aspirin only as told by your health care provider. Make sure that you understand how much to take and what form to take. Work with your health care provider to find out whether it is safe and   beneficial for you to take aspirin daily. °Ask your health care provider if you need to take a cholesterol-lowering medicine (statin). °Find healthy ways to manage stress, such  as: °Meditation, yoga, or listening to music. °Journaling. °Talking to a trusted person. °Spending time with friends and family. °Safety °Always wear your seat belt while driving or riding in a vehicle. °Do not drive: °If you have been drinking alcohol. Do not ride with someone who has been drinking. °When you are tired or distracted. °While texting. °If you have been using any mind-altering substances or drugs. °Wear a helmet and other protective equipment during sports activities. °If you have firearms in your house, make sure you follow all gun safety procedures. °Minimize exposure to UV radiation to reduce your risk of skin cancer. °What's next? °Visit your health care provider once a year for an annual wellness visit. °Ask your health care provider how often you should have your eyes and teeth checked. °Stay up to date on all vaccines. °This information is not intended to replace advice given to you by your health care provider. Make sure you discuss any questions you have with your health care provider. °Document Revised: 01/07/2021 Document Reviewed: 01/07/2021 °[employer]vier Patient Education © 2022 [employer] ° °

## 2021-08-26 NOTE — Progress Notes (Signed)
BP (!) 150/90 (BP Location: Left Arm, Patient Position: Sitting, Cuff Size: Large)    Pulse 79    Temp 98.2 F (36.8 C) (Oral)    Wt 210 lb (95.3 kg)    SpO2 100%    BMI 29.29 kg/m    Subjective:    Patient ID: Dennis Steele, male    DOB: [birth year], 71 y.o.   MRN: [id number]  HPI: Dennis Steele is a 71 y.o. male presenting on 08/26/2021 for comprehensive medical examination. Current medical complaints include: night sweats  Hypertension: - Medications: lisinopril - Compliance: good - Checking BP at home: yes, usually 130s SBP - Denies any SOB, CP, vision changes, LE edema, medication SEs, or symptoms of hypotension - Diet: 3 meals per day.  - Exercise: work with trainer, stretching, machines, cardio. Shoulder strengthening  H/o prostate cancer - 9 years ago, s/p prostatectomy. Last PSA <0.1. Follows with urology prn, wants PCP to manage PSA checks.  Glaucoma - follows with optho, last visit 04/2021. On timolol drops.   Night sweats - soaking wet Tshirt - maybe 1-2 years duration - only at night, most nights of the week.  - No issues with exertion. - h/o recurrent sinusitis - denies other symptoms - no weight loss, fevers, chest pain, SOB, lumps/bumps, abdominal pain.   He currently lives with: wife, puppy  Depression Screen done today and results listed below:  Depression screen [facility] 2/9 08/26/2021 06/30/2020 07/06/2018 06/30/2017 06/08/2016  Decreased Interest 0 0 0 - 0  Down, Depressed, Hopeless 0 0 0 0 0  PHQ - 2 Score 0 0 0 0 0  Altered sleeping 0 0 - - -  Tired, decreased energy 0 0 - - -  Change in appetite 0 0 - - -  Feeling bad or failure about yourself  0 0 - - -  Trouble concentrating 0 0 - - -  Moving slowly or fidgety/restless 0 0 - - -  Suicidal thoughts 0 0 - - -  PHQ-9 Score 0 0 - - -  Difficult doing work/chores - Not difficult at all - - -    Past Medical History:  Past Medical History:  Diagnosis Date   Actinic keratosis    Basal cell carcinoma  03/29/2013   Right forearm   Basal cell carcinoma 08/05/2016   Right mid to proximal dorsum forearm   Dysplastic nevus 07/31/2015   Right low back   GERD (gastroesophageal reflux disease)    Hypertension    OAG (open angle glaucoma)    Prostate cancer ([place])    Squamous cell carcinoma of skin 02/27/2009   Right medial calf    Surgical History:  Past Surgical History:  Procedure Laterality Date   ANTERIOR CRUCIATE LIGAMENT REPAIR  25 yrs ago   not sure which side   COLONOSCOPY WITH PROPOFOL N/A 10/04/2016   Procedure: COLONOSCOPY WITH PROPOFOL;  Surgeon: Manya Silvas, MD;  Location: [facility] ENDOSCOPY;  Service: Endoscopy;  Laterality: N/A;   ESOPHAGOGASTRODUODENOSCOPY (EGD) WITH PROPOFOL N/A 10/04/2016   Procedure: ESOPHAGOGASTRODUODENOSCOPY (EGD) WITH PROPOFOL;  Surgeon: Manya Silvas, MD;  Location: [facility] ENDOSCOPY;  Service: Endoscopy;  Laterality: N/A;   EYE SURGERY  04/2015   lens surgery from a dislodged les from cataract surgery   LYMPHADENECTOMY  06/19/2012   Procedure: LYMPHADENECTOMY;  Surgeon: Dutch Gray, MD;  Location: [facility] ORS;  Service: Urology;  Laterality: Bilateral;   ROBOT ASSISTED LAPAROSCOPIC RADICAL PROSTATECTOMY  06/19/2012   Procedure: ROBOTIC ASSISTED LAPAROSCOPIC RADICAL  PROSTATECTOMY LEVEL 2;  Surgeon: Dutch Gray, MD;  Location: [facility] ORS;  Service: Urology;  Laterality: N/A;   TOTAL HIP ARTHROPLASTY Right 2015    Medications:  Current Outpatient Medications on File Prior to Visit  Medication Sig   azelastine (ASTELIN) 0.1 % nasal spray Place 2 sprays into both nostrils 2 (two) times daily. Use in each nostril as directed   latanoprost (XALATAN) 0.005 % ophthalmic solution Place 1 drop into the left eye at bedtime.   lisinopril (ZESTRIL) 20 MG tablet TAKE ONE TABLET BY MOUTH EVERY DAY   meloxicam (MOBIC) 7.5 MG tablet TAKE ONE TABLET BY MOUTH TWICE DAILY AS NEEDED FOR PAIN   MULTIPLE VITAMIN PO Take by mouth.   sildenafil (VIAGRA) 100 MG tablet Take 100 mg  by mouth as needed.   timolol (TIMOPTIC) 0.25 % ophthalmic solution Apply to eye.   Turmeric (QC TUMERIC COMPLEX PO) Take by mouth.   valACYclovir (VALTREX) 1000 MG tablet Take 1,000 mg by mouth as needed. Only for outbreaks of fever blisters   benzocaine-menthol (CHLORAEPTIC) 6-10 MG lozenge Take 1 lozenge by mouth as needed for sore throat. (Patient not taking: Reported on 08/26/2021)   benzonatate (TESSALON) 200 MG capsule Take 1 capsule (200 mg total) by mouth 2 (two) times daily as needed for cough. (Patient not taking: Reported on 08/26/2021)   calcium carbonate (TUMS - DOSED IN MG ELEMENTAL CALCIUM) 500 MG chewable tablet Chew 1 tablet by mouth daily. (Patient not taking: Reported on 08/26/2021)   cetirizine (ZYRTEC) 10 MG tablet Take 1 tablet (10 mg total) by mouth daily. (Patient not taking: Reported on 08/26/2021)   fluticasone (FLONASE) 50 MCG/ACT nasal spray Place 2 sprays into both nostrils daily. (Patient not taking: Reported on 08/26/2021)   guaiFENesin-dextromethorphan (ROBITUSSIN DM) 100-10 MG/5ML syrup Take 5 mLs by mouth every 4 (four) hours as needed for cough. (Patient not taking: Reported on 08/26/2021)   ketotifen (ZADITOR) 0.025 % ophthalmic solution Place 1 drop into both eyes 2 (two) times daily. (Patient not taking: Reported on 08/26/2021)   sodium chloride (OCEAN) 0.65 % SOLN nasal spray Place 1 spray into both nostrils as needed for congestion. (Patient not taking: Reported on 08/26/2021)   Travoprost, BAK Free, (TRAVATAN) 0.004 % SOLN ophthalmic solution Apply to eye. (Patient not taking: Reported on 08/26/2021)   valACYclovir (VALTREX) 1000 MG tablet For fever blisters - take 2 tabs at onset of outbreak then 2 more tabs 12 hours later. (Patient not taking: Reported on 03/06/2021)   valACYclovir (VALTREX) 1000 MG tablet TAKE 2 TABLETS AT ONSET OF SYMPTOMS AND 2 TABLETS 12 HOURS LATER (Patient not taking: Reported on 03/06/2021)   No current facility-administered medications on file prior to  visit.    Allergies:  No Known Allergies  Social History:  Social History   Socioeconomic History   Marital status: Married    Spouse name: Not on file   Number of children: Not on file   Years of education: Not on file   Highest education level: Not on file  Occupational History   Not on file  Tobacco Use   Smoking status: Former    Types: Cigars   Smokeless tobacco: Never   Tobacco comments:    quit cig 2 yrs ago, social smoking only  Substance and Sexual Activity   Alcohol use: Yes    Comment: occasional 3 x week   Drug use: No   Sexual activity: Yes  Other Topics Concern   Not on file  Social History Narrative   Not on file   Social Determinants of Health   Financial Resource Strain: Not on file  Food Insecurity: Not on file  Transportation Needs: Not on file  Physical Activity: Not on file  Stress: Not on file  Social Connections: Not on file  Intimate Partner Violence: Not on file   Social History   Tobacco Use  Smoking Status Former   Types: Cigars  Smokeless Tobacco Never  Tobacco Comments   quit cig 2 yrs ago, social smoking only   Social History   Substance and Sexual Activity  Alcohol Use Yes   Comment: occasional 3 x week    Family History:  Family History  Problem Relation Age of Onset   Diabetes Mother    Cancer Mother        breast   Stroke Mother    Heart disease Mother    Cancer Father        stomach, prostate- pt not complete   Cancer Brother        esophagus    Past medical history, surgical history, medications, allergies, family history and social history reviewed with patient today and changes made to appropriate areas of the chart.      Objective:    BP (!) 150/90 (BP Location: Left Arm, Patient Position: Sitting, Cuff Size: Large)    Pulse 79    Temp 98.2 F (36.8 C) (Oral)    Wt 210 lb (95.3 kg)    SpO2 100%    BMI 29.29 kg/m   Wt Readings from Last 3 Encounters:  08/26/21 210 lb (95.3 kg)  06/30/20 202 lb  (91.6 kg)  03/13/19 204 lb 6.4 oz (92.7 kg)    Physical Exam Vitals reviewed.  Constitutional:      General: He is not in acute distress.    Appearance: Normal appearance. He is not ill-appearing.  HENT:     Head: Normocephalic and atraumatic.     Right Ear: External ear normal.     Left Ear: External ear normal.     Nose: Nose normal.     Mouth/Throat:     Mouth: Mucous membranes are moist.     Pharynx: Oropharynx is clear.  Eyes:     Extraocular Movements: Extraocular movements intact.     Pupils: Pupils are equal, round, and reactive to light.  Cardiovascular:     Rate and Rhythm: Normal rate and regular rhythm.     Heart sounds: Normal heart sounds. No murmur heard. Pulmonary:     Effort: Pulmonary effort is normal. No respiratory distress.     Breath sounds: Normal breath sounds.  Abdominal:     General: Bowel sounds are normal. There is no distension.     Palpations: Abdomen is soft. There is no mass.     Tenderness: There is no abdominal tenderness.  Musculoskeletal:        General: Normal range of motion.     Cervical back: Normal range of motion.     Right lower leg: No edema.     Left lower leg: No edema.  Lymphadenopathy:     Cervical: No cervical adenopathy.  Skin:    General: Skin is warm and dry.     Findings: No bruising or rash.  Neurological:     Mental Status: He is alert and oriented to person, place, and time. Mental status is at baseline.     Gait: Gait normal.  Psychiatric:  Mood and Affect: Mood normal.        Thought Content: Thought content normal.    Results for orders placed or performed in visit on 08/26/21  Hepatitis C antibody  Result Value Ref Range   Hep C Virus Ab <0.1 0.0 - 0.9 s/co ratio  Comprehensive metabolic panel  Result Value Ref Range   Glucose 101 (H) 70 - 99 mg/dL   BUN 21 8 - 27 mg/dL   Creatinine, Ser 0.77 0.76 - 1.27 mg/dL   eGFR 95 >59 mL/min/1.73   BUN/Creatinine Ratio 27 (H) 10 - 24   Sodium 138 134 -  144 mmol/L   Potassium 4.7 3.5 - 5.2 mmol/L   Chloride 102 96 - 106 mmol/L   CO2 24 20 - 29 mmol/L   Calcium 9.6 8.6 - 10.2 mg/dL   Total Protein 6.8 6.0 - 8.5 g/dL   Albumin 4.1 3.7 - 4.7 g/dL   Globulin, Total 2.7 1.5 - 4.5 g/dL   Albumin/Globulin Ratio 1.5 1.2 - 2.2   Bilirubin Total 0.2 0.0 - 1.2 mg/dL   Alkaline Phosphatase 72 44 - 121 IU/L   AST 21 0 - 40 IU/L   ALT 16 0 - 44 IU/L  CBC  Result Value Ref Range   WBC 7.6 3.4 - 10.8 x10E3/uL   RBC 4.26 4.14 - 5.80 x10E6/uL   Hemoglobin 13.5 13.0 - 17.7 g/dL   Hematocrit 39.0 37.5 - 51.0 %   MCV 92 79 - 97 fL   MCH 31.7 26.6 - 33.0 pg   MCHC 34.6 31.5 - 35.7 g/dL   RDW 13.2 11.6 - 15.4 %   Platelets 224 150 - 450 x10E3/uL  Lipid panel  Result Value Ref Range   Cholesterol, Total 195 100 - 199 mg/dL   Triglycerides 358 (H) 0 - 149 mg/dL   HDL 44 >39 mg/dL   VLDL Cholesterol Cal 59 (H) 5 - 40 mg/dL   LDL Chol Calc (NIH) 92 0 - 99 mg/dL   Chol/HDL Ratio 4.4 0.0 - 5.0 ratio  Hemoglobin A1c  Result Value Ref Range   Hgb A1c MFr Bld 5.8 (H) 4.8 - 5.6 %   Est. average glucose Bld gHb Est-mCnc 120 mg/dL  QuantiFERON-TB Gold Plus  Result Value Ref Range   QuantiFERON Incubation WILL FOLLOW    QuantiFERON-TB Gold Plus WILL FOLLOW   TSH  Result Value Ref Range   TSH 1.700 0.450 - 4.500 uIU/mL  PSA  Result Value Ref Range   Prostate Specific Ag, Serum <0.1 0.0 - 4.0 ng/mL  C-reactive protein  Result Value Ref Range   CRP 2 0 - 10 mg/L      Assessment & Plan:   Problem List Items Addressed This Visit       Cardiovascular and Mediastinum   BP (high blood pressure)    Slightly elevated today but with normal measurements at home, no changes today. Obtaining labs.      Relevant Orders   Comprehensive metabolic panel (Completed)   Lipid panel (Completed)     Genitourinary   Prostate cancer [facility])    S/p prostatectomy. Recheck PSA today      Relevant Orders   PSA     Other   Hyperlipidemia   Relevant Orders    Lipid panel (Completed)   Hemoglobin A1c (Completed)   Night sweats    Unclear etiology. UTD on age-appropriate cancer screenings. Meds reviewed. Obtaining labs today. Recommend environmental changes such as sleeping without covers or  with light blanket to see if associated. F/u in 3 months.      Relevant Orders   Comprehensive metabolic panel (Completed)   CBC (Completed)   TSH   QuantiFERON-TB Gold Plus   C-reactive protein   QuantiFERON-TB Gold Plus (Completed)   TSH (Completed)   C-reactive protein (Completed)   Open-angle glaucoma    Continue to follow with Optho      Other Visit Diagnoses     Need for hepatitis C screening test    -  Primary   Relevant Orders   Hepatitis C antibody (Completed)   Former smoker       Relevant Orders   [place] [facility] MEDICARE SCREENING   Overweight       Relevant Orders   Comprehensive metabolic panel (Completed)   Lipid panel (Completed)   Hemoglobin A1c (Completed)   Annual physical exam       Relevant Orders   Hepatitis C antibody (Completed)   [place] MEDICARE SCREENING   Comprehensive metabolic panel (Completed)   CBC (Completed)   Lipid panel (Completed)   Hemoglobin A1c (Completed)   PSA   TSH   PSA (Completed)   Other tobacco product nicotine dependence with nicotine-induced disorder       Relevant Orders   [place] MEDICARE SCREENING   Screening for AAA (aortic abdominal aneurysm)       Relevant Orders   [place] MEDICARE SCREENING   Hyperglycemia       Relevant Orders   Hemoglobin A1c (Completed)        LABORATORY TESTING:  Health maintenance labs ordered today as discussed above.   The natural history of prostate cancer and ongoing controversy regarding screening and potential treatment outcomes of prostate cancer has been discussed with the patient. The meaning of a false positive PSA and a false negative PSA has been discussed. He indicates understanding of the limitations of this screening test and wishes to  proceed with screening PSA testing.   IMMUNIZATIONS:   - Tdap: Tetanus vaccination status reviewed: due, advised to receive at pharmacy. - Influenza: Up to date - Pneumococcal: Up to date - HPV: Not applicable - Shingrix vaccine: Up to date - COVID vaccine: has received 4 doses of mRNA vaccine  SCREENING: - Colonoscopy: Up to date  Discussed with patient purpose of the colonoscopy is to detect colon cancer at curable precancerous or early stages   - AAA Screening: Ordered today  - Lung cancer screening: n/a  Hep C Screening: due STD testing and prevention (HIV/chl/gon/syphilis): no concerns Sexual History: Incontinence Symptoms:   PATIENT COUNSELING:    Advanced Care Planning: A voluntary discussion about advance care planning including the explanation and discussion of advance directives.  Discussed health care proxy and Living will, and the patient was able to identify a health care proxy as wife, Toa Mia.  Patient does have a living will at present time. If patient does have living will, I have requested they bring this to the clinic to be scanned in to their chart.  Sexuality: Discussed sexually transmitted diseases, partner selection, use of condoms, avoidance of unintended pregnancy  and contraceptive alternatives.   Advised to avoid cigarette smoking.  I discussed with the patient that most people either abstain from alcohol or drink within safe limits (<=14/week and <=4 drinks/occasion for males, <=7/weeks and <= 3 drinks/occasion for females) and that the risk for alcohol disorders and other health effects rises proportionally with the number of drinks per week  and how often a drinker exceeds daily limits.  Discussed cessation/primary prevention of drug use and availability of treatment for abuse.   Diet: Encouraged to adjust caloric intake to maintain  or achieve ideal body weight, to reduce intake of dietary saturated fat and total fat, to limit sodium intake by  avoiding high sodium foods and not adding table salt, and to maintain adequate dietary potassium and calcium preferably from fresh fruits, vegetables, and low-fat dairy products.    stressed the importance of regular exercise  Injury prevention: Discussed safety belts, safety helmets, smoke detector, smoking near bedding or upholstery.   Dental health: Discussed importance of regular tooth brushing, flossing, and dental visits.   Follow up plan: NEXT PREVENTATIVE PHYSICAL DUE IN 1 YEAR. Return in about 3 months (around 11/23/2021) for night sweats.

## 2021-08-27 DIAGNOSIS — R61 Generalized hyperhidrosis: Secondary | ICD-10-CM | POA: Insufficient documentation

## 2021-08-27 LAB — QUANTIFERON-TB GOLD PLUS

## 2021-08-27 NOTE — Assessment & Plan Note (Signed)
Continue to follow with Optho. 

## 2021-08-27 NOTE — Assessment & Plan Note (Addendum)
Unclear etiology. UTD on age-appropriate cancer screenings. Meds reviewed. Obtaining labs today. Recommend environmental changes such as sleeping without covers or with light blanket to see if associated. F/u in 3 months.

## 2021-08-27 NOTE — Assessment & Plan Note (Signed)
Slightly elevated today but with normal measurements at home, no changes today. Obtaining labs.

## 2021-08-27 NOTE — Addendum Note (Signed)
Addended by: Myles Gip on: 08/27/2021 12:36 PM   Modules accepted: Orders

## 2021-08-27 NOTE — Assessment & Plan Note (Signed)
S/p prostatectomy. Recheck PSA today

## 2021-08-29 LAB — QUANTIFERON-TB GOLD PLUS
QuantiFERON Mitogen Value: >10 IU/mL
QuantiFERON Nil Value: 0.65 IU/mL
QuantiFERON TB1 Ag Value: 0.58 IU/mL
QuantiFERON TB2 Ag Value: 0.65 IU/mL
QuantiFERON-TB Gold Plus: NEGATIVE

## 2021-08-29 LAB — COMPREHENSIVE METABOLIC PANEL
ALT: 16 IU/L (ref 0–44)
AST: 21 IU/L (ref 0–40)
Albumin/Globulin Ratio: 1.5 (ref 1.2–2.2)
Albumin: 4.1 g/dL (ref 3.7–4.7)
Alkaline Phosphatase: 72 IU/L (ref 44–121)
BUN/Creatinine Ratio: 27 — ABNORMAL HIGH (ref 10–24)
BUN: 21 mg/dL (ref 8–27)
Bilirubin Total: 0.2 mg/dL (ref 0.0–1.2)
CO2: 24 mmol/L (ref 20–29)
Calcium: 9.6 mg/dL (ref 8.6–10.2)
Chloride: 102 mmol/L (ref 96–106)
Creatinine, Ser: 0.77 mg/dL (ref 0.76–1.27)
Globulin, Total: 2.7 g/dL (ref 1.5–4.5)
Glucose: 101 mg/dL — ABNORMAL HIGH (ref 70–99)
Potassium: 4.7 mmol/L (ref 3.5–5.2)
Sodium: 138 mmol/L (ref 134–144)
Total Protein: 6.8 g/dL (ref 6.0–8.5)
eGFR: 95 mL/min/{1.73_m2} (ref >59)

## 2021-08-29 LAB — LIPID PANEL
Chol/HDL Ratio: 4.4 ratio (ref 0.0–5.0)
Cholesterol, Total: 195 mg/dL (ref 100–199)
HDL: 44 mg/dL (ref >39)
LDL Chol Calc (NIH): 92 mg/dL (ref 0–99)
Triglycerides: 358 mg/dL — ABNORMAL HIGH (ref 0–149)
VLDL Cholesterol Cal: 59 mg/dL — ABNORMAL HIGH (ref 5–40)

## 2021-08-29 LAB — CBC
Hematocrit: 39 % (ref 37.5–51.0)
Hemoglobin: 13.5 g/dL (ref 13.0–17.7)
MCH: 31.7 pg (ref 26.6–33.0)
MCHC: 34.6 g/dL (ref 31.5–35.7)
MCV: 92 fL (ref 79–97)
Platelets: 224 10*3/uL (ref 150–450)
RBC: 4.26 x10E6/uL (ref 4.14–5.80)
RDW: 13.2 % (ref 11.6–15.4)
WBC: 7.6 10*3/uL (ref 3.4–10.8)

## 2021-08-29 LAB — HEMOGLOBIN A1C
Est. average glucose Bld gHb Est-mCnc: 120 mg/dL
Hgb A1c MFr Bld: 5.8 % — ABNORMAL HIGH (ref 4.8–5.6)

## 2021-08-29 LAB — C-REACTIVE PROTEIN: CRP: 2 mg/L (ref 0–10)

## 2021-08-29 LAB — HEPATITIS C ANTIBODY: Hep C Virus Ab: <0.1 s/co ratio (ref 0.0–0.9)

## 2021-08-29 LAB — PSA: Prostate Specific Ag, Serum: <0.1 ng/mL (ref 0.0–4.0)

## 2021-08-29 LAB — TSH: TSH: 1.7 u[IU]/mL (ref 0.450–4.500)

## 2021-08-31 ENCOUNTER — Encounter: Payer: Medicare Other | Admitting: Dermatology

## 2021-08-31 NOTE — Addendum Note (Signed)
Addended by: Myles Gip on: 08/31/2021 03:56 PM   Modules accepted: Level of Service

## 2021-09-03 ENCOUNTER — Ambulatory Visit: Payer: Medicare Other

## 2021-09-07 DIAGNOSIS — H401321 Pigmentary glaucoma, left eye, mild stage: Secondary | ICD-10-CM | POA: Diagnosis not present

## 2021-09-07 DIAGNOSIS — H4010X3 Unspecified open-angle glaucoma, severe stage: Secondary | ICD-10-CM | POA: Diagnosis not present

## 2021-09-09 DIAGNOSIS — M5136 Other intervertebral disc degeneration, lumbar region: Secondary | ICD-10-CM | POA: Diagnosis not present

## 2021-09-09 DIAGNOSIS — M62830 Muscle spasm of back: Secondary | ICD-10-CM | POA: Diagnosis not present

## 2021-09-09 DIAGNOSIS — M9902 Segmental and somatic dysfunction of thoracic region: Secondary | ICD-10-CM | POA: Diagnosis not present

## 2021-09-09 DIAGNOSIS — M9903 Segmental and somatic dysfunction of lumbar region: Secondary | ICD-10-CM | POA: Diagnosis not present

## 2021-09-11 ENCOUNTER — Other Ambulatory Visit: Payer: Self-pay | Admitting: Physician Assistant

## 2021-09-11 DIAGNOSIS — E7800 Pure hypercholesterolemia, unspecified: Secondary | ICD-10-CM | POA: Diagnosis not present

## 2021-09-11 DIAGNOSIS — R739 Hyperglycemia, unspecified: Secondary | ICD-10-CM

## 2021-09-11 NOTE — Progress Notes (Signed)
Pt would like to repeat fasting

## 2021-09-12 LAB — COMPREHENSIVE METABOLIC PANEL
ALT: 20 IU/L (ref 0–44)
AST: 34 IU/L (ref 0–40)
Albumin/Globulin Ratio: 1.7 (ref 1.2–2.2)
Albumin: 4.2 g/dL (ref 3.7–4.7)
Alkaline Phosphatase: 58 IU/L (ref 44–121)
BUN/Creatinine Ratio: 23 (ref 10–24)
BUN: 21 mg/dL (ref 8–27)
Bilirubin Total: 0.3 mg/dL (ref 0.0–1.2)
CO2: 24 mmol/L (ref 20–29)
Calcium: 9.2 mg/dL (ref 8.6–10.2)
Chloride: 102 mmol/L (ref 96–106)
Creatinine, Ser: 0.92 mg/dL (ref 0.76–1.27)
Globulin, Total: 2.5 g/dL (ref 1.5–4.5)
Glucose: 108 mg/dL — ABNORMAL HIGH (ref 70–99)
Potassium: 4.5 mmol/L (ref 3.5–5.2)
Sodium: 141 mmol/L (ref 134–144)
Total Protein: 6.7 g/dL (ref 6.0–8.5)
eGFR: 88 mL/min/{1.73_m2} (ref >59)

## 2021-09-12 LAB — LIPID PANEL
Chol/HDL Ratio: 4 ratio (ref 0.0–5.0)
Cholesterol, Total: 178 mg/dL (ref 100–199)
HDL: 45 mg/dL (ref >39)
LDL Chol Calc (NIH): 112 mg/dL — ABNORMAL HIGH (ref 0–99)
Triglycerides: 115 mg/dL (ref 0–149)
VLDL Cholesterol Cal: 21 mg/dL (ref 5–40)

## 2021-09-15 ENCOUNTER — Other Ambulatory Visit: Payer: Self-pay

## 2021-09-15 ENCOUNTER — Ambulatory Visit
Admission: RE | Admit: 2021-09-15 | Discharge: 2021-09-15 | Disposition: A | Discharge status: Home / Self Care | Payer: Medicare Other | Source: Ambulatory Visit | Attending: Family Medicine | Admitting: Family Medicine

## 2021-09-15 DIAGNOSIS — Z136 Encounter for screening for cardiovascular disorders: Secondary | ICD-10-CM | POA: Diagnosis not present

## 2021-09-15 DIAGNOSIS — F17299 Nicotine dependence, other tobacco product, with unspecified nicotine-induced disorders: Secondary | ICD-10-CM | POA: Diagnosis not present

## 2021-09-15 DIAGNOSIS — Z87891 Personal history of nicotine dependence: Secondary | ICD-10-CM | POA: Diagnosis not present

## 2021-09-15 DIAGNOSIS — Z0000 Encounter for general adult medical examination without abnormal findings: Secondary | ICD-10-CM | POA: Diagnosis not present

## 2021-09-23 ENCOUNTER — Ambulatory Visit (INDEPENDENT_AMBULATORY_CARE_PROVIDER_SITE_OTHER): Payer: Medicare Other | Admitting: Dermatology

## 2021-09-23 ENCOUNTER — Other Ambulatory Visit: Payer: Self-pay

## 2021-09-23 DIAGNOSIS — B009 Herpesviral infection, unspecified: Secondary | ICD-10-CM | POA: Diagnosis not present

## 2021-09-23 DIAGNOSIS — D229 Melanocytic nevi, unspecified: Secondary | ICD-10-CM

## 2021-09-23 DIAGNOSIS — L814 Other melanin hyperpigmentation: Secondary | ICD-10-CM

## 2021-09-23 DIAGNOSIS — L821 Other seborrheic keratosis: Secondary | ICD-10-CM

## 2021-09-23 DIAGNOSIS — D1800 Hemangioma unspecified site: Secondary | ICD-10-CM

## 2021-09-23 DIAGNOSIS — Z1283 Encounter for screening for malignant neoplasm of skin: Secondary | ICD-10-CM | POA: Diagnosis not present

## 2021-09-23 DIAGNOSIS — L578 Other skin changes due to chronic exposure to nonionizing radiation: Secondary | ICD-10-CM

## 2021-09-23 DIAGNOSIS — L820 Inflamed seborrheic keratosis: Secondary | ICD-10-CM

## 2021-09-23 DIAGNOSIS — L570 Actinic keratosis: Secondary | ICD-10-CM | POA: Diagnosis not present

## 2021-09-23 DIAGNOSIS — Z86018 Personal history of other benign neoplasm: Secondary | ICD-10-CM | POA: Diagnosis not present

## 2021-09-23 DIAGNOSIS — Z85828 Personal history of other malignant neoplasm of skin: Secondary | ICD-10-CM

## 2021-09-23 MED ORDER — VALACYCLOVIR HCL 1 G PO TABS: ORAL_TABLET | ORAL | 11 refills | Status: DC

## 2021-09-23 NOTE — Progress Notes (Signed)
? ?Follow-Up Visit ?  ?Subjective  ?Dennis Steele is a 71 y.o. male who presents for the following: Annual Exam (Mole check). ?The patient presents for Total-Body Skin Exam (TBSE) for skin cancer screening and mole check.  The patient has spots, moles and lesions to be evaluated, some may be new or changing and the patient has concerns that these could be cancer.  ? ?The following portions of the chart were reviewed this encounter and updated as appropriate:  ? Tobacco  Allergies  Meds  Problems  Med Hx  Surg Hx  Fam Hx   ?  ?Review of Systems:  No other skin or systemic complaints except as noted in HPI or Assessment and Plan. ? ?Objective  ?Well appearing patient in no apparent distress; mood and affect are within normal limits. ? ?A full examination was performed including scalp, head, eyes, ears, nose, lips, neck, chest, axillae, abdomen, back, buttocks, bilateral upper extremities, bilateral lower extremities, hands, feet, fingers, toes, fingernails, and toenails. All findings within normal limits unless otherwise noted below. ? ?face x 4 (4) ?Erythematous thin papules/macules with gritty scale.  ? ?left nasal bridge x 1, right arm x 2, right post thigh x 1  (3) ?Stuck-on, waxy, tan-brown papules--Discussed benign etiology and prognosis.  ? ?lips ?Clear today  ? ? ?Assessment & Plan  ?AK (actinic keratosis) (4) ?face x 4 ? ?Actinic keratoses are precancerous spots that appear secondary to cumulative UV radiation exposure/sun exposure over time. They are chronic with expected duration over 1 year. A portion of actinic keratoses will progress to squamous cell carcinoma of the skin. It is not possible to reliably predict which spots will progress to skin cancer and so treatment is recommended to prevent development of skin cancer. ? ?Recommend daily broad spectrum sunscreen SPF 30+ to sun-exposed areas, reapply every 2 hours as needed.  ?Recommend staying in the shade or wearing long sleeves, sun glasses  (UVA+UVB protection) and wide brim hats (4-inch brim around the entire circumference of the hat). ?Call for new or changing lesions.  ? ?Destruction of lesion - face x 4 ?Complexity: simple   ?Destruction method: cryotherapy   ?Informed consent: discussed and consent obtained   ?Timeout:  patient name, date of birth, surgical site, and procedure verified ?Lesion destroyed using liquid nitrogen: Yes   ?Region frozen until ice ball extended beyond lesion: Yes   ?Outcome: patient tolerated procedure well with no complications   ?Post-procedure details: wound care instructions given   ? ?Inflamed seborrheic keratosis ?left nasal bridge x 1, right arm x 2, right post thigh x 1  (3) ? ?Reassured benign age-related growth.  Recommend observation.  Discussed cryotherapy if spot(s) become irritated or inflamed.  ? ?Prior to procedure, discussed risks of blister formation, small wound, skin dyspigmentation, or rare scar following cryotherapy. Recommend Vaseline ointment to treated areas while healing.  ? ?Destruction of lesion - left nasal bridge x 1, right arm x 2, right post thigh x 1  (3) ?Complexity: simple   ?Destruction method: cryotherapy   ?Informed consent: discussed and consent obtained   ?Timeout:  patient name, date of birth, surgical site, and procedure verified ?Lesion destroyed using liquid nitrogen: Yes   ?Region frozen until ice ball extended beyond lesion: Yes   ?Outcome: patient tolerated procedure well with no complications   ?Post-procedure details: wound care instructions given   ? ?Herpes simplex ?lips ? ?History of HSV - Continue Valtrex 1g take 2 tabs at onset of outbreak then  73more tabs 12 hours later.  ?Herpes Simplex Virus = Cold Sores = Fever Blisters is a chronic recurring blistering; scabbing sore-producing viral infection that is recurrent usually in the same area triggered by stress, sun/UV exposure and trauma.  It is infectious and can be spread from person to person by direct contact.  It is  not curable, but is treatable with topical and oral medication. ? ?Related Medications ?valACYclovir (VALTREX) 1000 MG tablet ?For fever blisters - take 2 tabs at onset of outbreak then 2 more tabs 12 hours later. ? ?Skin cancer screening ? ?Lentigines ?- Scattered tan macules ?- Due to sun exposure ?- Benign-appearing, observe ?- Recommend daily broad spectrum sunscreen SPF 30+ to sun-exposed areas, reapply every 2 hours as needed. ?- Call for any changes ? ?Seborrheic Keratoses ?- Stuck-on, waxy, tan-brown papules and/or plaques  ?- Benign-appearing ?- Discussed benign etiology and prognosis. ?- Observe ?- Call for any changes ? ?Melanocytic Nevi ?- Tan-brown and/or pink-flesh-colored symmetric macules and papules ?- Benign appearing on exam today ?- Observation ?- Call clinic for new or changing moles ?- Recommend daily use of broad spectrum spf 30+ sunscreen to sun-exposed areas.  ? ?Hemangiomas ?- Red papules ?- Discussed benign nature ?- Observe ?- Call for any changes ? ?Actinic Damage ?- Chronic condition, secondary to cumulative UV/sun exposure ?- diffuse scaly erythematous macules with underlying dyspigmentation ?- Recommend daily broad spectrum sunscreen SPF 30+ to sun-exposed areas, reapply every 2 hours as needed.  ?- Staying in the shade or wearing long sleeves, sun glasses (UVA+UVB protection) and wide brim hats (4-inch brim around the entire circumference of the hat) are also recommended for sun protection.  ?- Call for new or changing lesions. ? ?History of Basal Cell Carcinoma of the Skin ?Right forehead, right mid to proximal dorsum forehead ?- No evidence of recurrence today ?- Recommend regular full body skin exams ?- Recommend daily broad spectrum sunscreen SPF 30+ to sun-exposed areas, reapply every 2 hours as needed.  ?- Call if any new or changing lesions are noted between office visits  ? ?History of Squamous Cell Carcinoma of the Skin ?Right med calf ?- No evidence of recurrence today ?-  No lymphadenopathy ?- Recommend regular full body skin exams ?- Recommend daily broad spectrum sunscreen SPF 30+ to sun-exposed areas, reapply every 2 hours as needed.  ?- Call if any new or changing lesions are noted between office visits ? ? History of Dysplastic Nevi ?Right low back ?- No evidence of recurrence today ?- Recommend regular full body skin exams ?- Recommend daily broad spectrum sunscreen SPF 30+ to sun-exposed areas, reapply every 2 hours as needed.  ?- Call if any new or changing lesions are noted between office visits  ? ?Skin cancer screening performed today.  ? ?Return in about 1 year (around 09/24/2022) for TBSE, hx of Skin cancer . ? ?I, Marye Round, CMA, am acting as scribe for Sarina Ser, MD .  ?Documentation: I have reviewed the above documentation for accuracy and completeness, and I agree with the above. ? ?Sarina Ser, MD ? ?

## 2021-09-23 NOTE — Patient Instructions (Addendum)

## 2021-09-25 ENCOUNTER — Encounter: Payer: Self-pay | Admitting: Dermatology

## 2021-09-30 DIAGNOSIS — M62830 Muscle spasm of back: Secondary | ICD-10-CM | POA: Diagnosis not present

## 2021-09-30 DIAGNOSIS — M9903 Segmental and somatic dysfunction of lumbar region: Secondary | ICD-10-CM | POA: Diagnosis not present

## 2021-09-30 DIAGNOSIS — M9902 Segmental and somatic dysfunction of thoracic region: Secondary | ICD-10-CM | POA: Diagnosis not present

## 2021-09-30 DIAGNOSIS — M5136 Other intervertebral disc degeneration, lumbar region: Secondary | ICD-10-CM | POA: Diagnosis not present

## 2021-10-20 DIAGNOSIS — Z20822 Contact with and (suspected) exposure to covid-19: Secondary | ICD-10-CM | POA: Diagnosis not present

## 2021-10-26 ENCOUNTER — Ambulatory Visit: Payer: Medicare Other | Admitting: Family Medicine

## 2021-11-04 ENCOUNTER — Ambulatory Visit: Payer: Medicare Other | Admitting: Family Medicine

## 2021-11-04 DIAGNOSIS — M9903 Segmental and somatic dysfunction of lumbar region: Secondary | ICD-10-CM | POA: Diagnosis not present

## 2021-11-04 DIAGNOSIS — M62830 Muscle spasm of back: Secondary | ICD-10-CM | POA: Diagnosis not present

## 2021-11-04 DIAGNOSIS — M5136 Other intervertebral disc degeneration, lumbar region: Secondary | ICD-10-CM | POA: Diagnosis not present

## 2021-11-04 DIAGNOSIS — M9902 Segmental and somatic dysfunction of thoracic region: Secondary | ICD-10-CM | POA: Diagnosis not present

## 2021-11-19 DIAGNOSIS — Z20822 Contact with and (suspected) exposure to covid-19: Secondary | ICD-10-CM | POA: Diagnosis not present

## 2021-11-23 DIAGNOSIS — K219 Gastro-esophageal reflux disease without esophagitis: Secondary | ICD-10-CM | POA: Diagnosis not present

## 2021-11-23 DIAGNOSIS — R9431 Abnormal electrocardiogram [ECG] [EKG]: Secondary | ICD-10-CM | POA: Diagnosis not present

## 2021-11-23 DIAGNOSIS — Z125 Encounter for screening for malignant neoplasm of prostate: Secondary | ICD-10-CM | POA: Diagnosis not present

## 2021-11-23 DIAGNOSIS — Z87442 Personal history of urinary calculi: Secondary | ICD-10-CM | POA: Diagnosis not present

## 2021-11-23 DIAGNOSIS — Z131 Encounter for screening for diabetes mellitus: Secondary | ICD-10-CM | POA: Diagnosis not present

## 2021-11-23 DIAGNOSIS — R7301 Impaired fasting glucose: Secondary | ICD-10-CM | POA: Diagnosis not present

## 2021-11-23 DIAGNOSIS — I493 Ventricular premature depolarization: Secondary | ICD-10-CM | POA: Diagnosis not present

## 2021-11-23 DIAGNOSIS — F17200 Nicotine dependence, unspecified, uncomplicated: Secondary | ICD-10-CM | POA: Diagnosis not present

## 2021-11-23 DIAGNOSIS — E663 Overweight: Secondary | ICD-10-CM | POA: Diagnosis not present

## 2021-11-23 DIAGNOSIS — I10 Essential (primary) hypertension: Secondary | ICD-10-CM | POA: Diagnosis not present

## 2021-11-23 DIAGNOSIS — Z136 Encounter for screening for cardiovascular disorders: Secondary | ICD-10-CM | POA: Diagnosis not present

## 2021-11-23 DIAGNOSIS — Z96612 Presence of left artificial shoulder joint: Secondary | ICD-10-CM | POA: Diagnosis not present

## 2021-11-23 DIAGNOSIS — M1990 Unspecified osteoarthritis, unspecified site: Secondary | ICD-10-CM | POA: Diagnosis not present

## 2021-11-23 DIAGNOSIS — E785 Hyperlipidemia, unspecified: Secondary | ICD-10-CM | POA: Diagnosis not present

## 2021-11-23 DIAGNOSIS — Z0000 Encounter for general adult medical examination without abnormal findings: Secondary | ICD-10-CM | POA: Diagnosis not present

## 2021-11-23 DIAGNOSIS — E7800 Pure hypercholesterolemia, unspecified: Secondary | ICD-10-CM | POA: Diagnosis not present

## 2021-12-02 DIAGNOSIS — M5136 Other intervertebral disc degeneration, lumbar region: Secondary | ICD-10-CM | POA: Diagnosis not present

## 2021-12-02 DIAGNOSIS — M62830 Muscle spasm of back: Secondary | ICD-10-CM | POA: Diagnosis not present

## 2021-12-02 DIAGNOSIS — M9903 Segmental and somatic dysfunction of lumbar region: Secondary | ICD-10-CM | POA: Diagnosis not present

## 2021-12-02 DIAGNOSIS — M9902 Segmental and somatic dysfunction of thoracic region: Secondary | ICD-10-CM | POA: Diagnosis not present

## 2021-12-09 ENCOUNTER — Encounter: Payer: Self-pay | Admitting: Family Medicine

## 2021-12-09 ENCOUNTER — Ambulatory Visit (INDEPENDENT_AMBULATORY_CARE_PROVIDER_SITE_OTHER): Payer: Medicare Other | Admitting: Family Medicine

## 2021-12-09 VITALS — BP 136/79 | HR 73 | Temp 98.7°F | Resp 16 | Ht 71.0 in | Wt 208.0 lb

## 2021-12-09 DIAGNOSIS — Z8546 Personal history of malignant neoplasm of prostate: Secondary | ICD-10-CM | POA: Diagnosis not present

## 2021-12-09 DIAGNOSIS — N481 Balanitis: Secondary | ICD-10-CM | POA: Diagnosis not present

## 2021-12-09 DIAGNOSIS — M5137 Other intervertebral disc degeneration, lumbosacral region: Secondary | ICD-10-CM

## 2021-12-09 DIAGNOSIS — H4010X0 Unspecified open-angle glaucoma, stage unspecified: Secondary | ICD-10-CM

## 2021-12-09 DIAGNOSIS — E7800 Pure hypercholesterolemia, unspecified: Secondary | ICD-10-CM | POA: Diagnosis not present

## 2021-12-09 DIAGNOSIS — Z87442 Personal history of urinary calculi: Secondary | ICD-10-CM

## 2021-12-09 DIAGNOSIS — I10 Essential (primary) hypertension: Secondary | ICD-10-CM

## 2021-12-09 MED ORDER — CLOTRIMAZOLE 1 % EX CREA: 1.0000 "application " | TOPICAL_CREAM | Freq: Two times a day (BID) | CUTANEOUS | 0 refills | Status: DC

## 2021-12-09 MED ORDER — TRIAMTERENE-HCTZ 37.5-25 MG PO CAPS: 1.0000 | ORAL_CAPSULE | ORAL | 2 refills | Status: DC

## 2021-12-09 NOTE — Progress Notes (Signed)
Established patient visit   Patient: Dennis Steele   DOB: 1951-05-03   71 y.o. Male  MRN: [id number] Visit Date: 12/09/2021  Today's healthcare provider: [name], MD   I,Tiffany Darlina Sicilian as a scribe for [name], MD.,have documented all relevant documentation on the behalf of [name], MD,as directed by  [name], MD while in the presence of [name], MD.  Chief Complaint  Patient presents with   Rash    Patient complains of rash on penis for a month. States he usually uses monistat and it clears it up but it is not helping this time.   Overall patient feeling fairly well.  He now is also followed in the [facility] he has access to specialty care at any point time. He has developed a slight dry cough over the past few months.  No other symptoms. Subjective    HPI HPI     Rash    Additional comments: Patient complains of rash on penis for a month. States he usually uses monistat and it clears it up but it is not helping this time.         Comments   He also wants to discuss his hypertension medication and how he has a dry cough at night and isn't sure if it's from that.       Last edited by Smitty Knudsen, CMA on 12/09/2021  1:49 PM.        Medications: Outpatient Medications Prior to Visit  Medication Sig   azelastine (ASTELIN) 0.1 % nasal spray Place 2 sprays into both nostrils 2 (two) times daily. Use in each nostril as directed   benzocaine-menthol (CHLORAEPTIC) 6-10 MG lozenge Take 1 lozenge by mouth as needed for sore throat.   benzonatate (TESSALON) 200 MG capsule Take 1 capsule (200 mg total) by mouth 2 (two) times daily as needed for cough.   calcium carbonate (TUMS - DOSED IN MG ELEMENTAL CALCIUM) 500 MG chewable tablet Chew 1 tablet by mouth daily.   cetirizine (ZYRTEC) 10 MG tablet Take 1 tablet (10 mg total) by mouth daily.   fluticasone (FLONASE) 50 MCG/ACT nasal spray Place 2 sprays  into both nostrils daily.   guaiFENesin-dextromethorphan (ROBITUSSIN DM) 100-10 MG/5ML syrup Take 5 mLs by mouth every 4 (four) hours as needed for cough.   ketotifen (ZADITOR) 0.025 % ophthalmic solution Place 1 drop into both eyes 2 (two) times daily.   latanoprost (XALATAN) 0.005 % ophthalmic solution Place 1 drop into the left eye at bedtime.   meloxicam (MOBIC) 7.5 MG tablet Take by mouth.   MULTIPLE VITAMIN PO Take by mouth.   rosuvastatin (CRESTOR) 10 MG tablet Take 10 mg by mouth daily.   sildenafil (VIAGRA) 100 MG tablet Take 100 mg by mouth as needed.   sodium chloride (OCEAN) 0.65 % SOLN nasal spray Place 1 spray into both nostrils as needed for congestion.   timolol (TIMOPTIC) 0.5 % ophthalmic solution Place 1 drop into the right eye every morning.   Turmeric (QC TUMERIC COMPLEX PO) Take by mouth.   valACYclovir (VALTREX) 1000 MG tablet Take 1,000 mg by mouth as needed. Only for outbreaks of fever blisters   valACYclovir (VALTREX) 1000 MG tablet TAKE 2 TABLETS AT ONSET OF SYMPTOMS AND 2 TABLETS 12 HOURS LATER   valACYclovir (VALTREX) 1000 MG tablet For fever blisters - take 2 tabs at onset of outbreak then 2 more tabs 12 hours  later.   [DISCONTINUED] lisinopril (ZESTRIL) 20 MG tablet TAKE ONE TABLET BY MOUTH EVERY DAY   [DISCONTINUED] meloxicam (MOBIC) 7.5 MG tablet TAKE ONE TABLET BY MOUTH TWICE DAILY AS NEEDED FOR PAIN   [DISCONTINUED] timolol (TIMOPTIC) 0.25 % ophthalmic solution Apply to eye.   [DISCONTINUED] Travoprost, BAK Free, (TRAVATAN) 0.004 % SOLN ophthalmic solution Apply to eye. (Patient not taking: Reported on 08/26/2021)   No facility-administered medications prior to visit.    Review of Systems      Objective    BP 136/79 (BP Location: Right Arm, Patient Position: Sitting, Cuff Size: Large)   Pulse 73   Temp 98.7 F (37.1 C) (Oral)   Resp 16   Ht '5\' 11"'$  (1.803 m)   Wt 208 lb (94.3 kg)   SpO2 98%   BMI 29.01 kg/m  BP Readings from Last 3 Encounters:   12/09/21 136/79  08/26/21 (!) 150/90  06/30/20 125/87   Wt Readings from Last 3 Encounters:  12/09/21 208 lb (94.3 kg)  08/26/21 210 lb (95.3 kg)  06/30/20 202 lb (91.6 kg)      Physical Exam Vitals reviewed.  Constitutional:      Appearance: He is well-developed.  HENT:     Head: Normocephalic and atraumatic.     Right Ear: External ear normal.     Left Ear: External ear normal.  Eyes:     General: No scleral icterus.    Conjunctiva/sclera: Conjunctivae normal.  Neck:     Thyroid: No thyromegaly.  Cardiovascular:     Rate and Rhythm: Normal rate and regular rhythm.     Heart sounds: Normal heart sounds.  Pulmonary:     Effort: Pulmonary effort is normal.  Abdominal:     Palpations: Abdomen is soft.  Genitourinary:    Comments: He is uncircumcised and has no swelling or induration but has a yeast balanitis on exam Skin:    General: Skin is warm and dry.  Neurological:     Mental Status: He is alert and oriented to person, place, and time.  Psychiatric:        Behavior: Behavior normal.        Thought Content: Thought content normal.        Judgment: Judgment normal.      No results found for any visits on 12/09/21.  Assessment & Plan     1. Primary hypertension At this time stop lisinopril and try Dyazide every morning.  Follow-up 2 months - triamterene-hydrochlorothiazide (DYAZIDE) 37.5-25 MG capsule; Take 1 each (1 capsule total) by mouth every morning.  Dispense: 30 capsule; Refill: 2  2. Balanitis Topical antifungal  3. Degeneration of intervertebral disc of lumbosacral region   4. History of prostate cancer   5. Pure hypercholesterolemia   6. Open-angle glaucoma, unspecified glaucoma stage, unspecified laterality, unspecified open-angle glaucoma type   7. Personal history of urinary calculi    Return in about 2 months (around 02/08/2022).      I, [name], MD, have reviewed all documentation for this visit. The documentation on  12/13/21 for the exam, diagnosis, procedures, and orders are all accurate and complete.    Raiford Fetterman Cranford Mon, MD  [facility] [phone] (phone) [phone] (fax)  [place]

## 2021-12-09 NOTE — Patient Instructions (Signed)
STOP LISINOPRIL

## 2021-12-16 ENCOUNTER — Ambulatory Visit: Payer: Self-pay

## 2021-12-16 ENCOUNTER — Other Ambulatory Visit: Payer: Self-pay | Admitting: Family Medicine

## 2021-12-16 MED ORDER — FLUCONAZOLE 150 MG PO TABS: 150.0000 mg | ORAL_TABLET | Freq: Once | ORAL | 0 refills | Status: AC

## 2021-12-16 NOTE — Telephone Encounter (Signed)
Please advise 

## 2021-12-16 NOTE — Telephone Encounter (Signed)
Pt stated the fungal cream (lotrimin cream) has not done anything to clear the rash on glans penis. Pt requesting an antibiotic to be called in. Pt seemed short and frustrated by the tone of his voice.  He has now had rash for 1 month.

## 2021-12-17 NOTE — Telephone Encounter (Signed)
Noted  

## 2021-12-23 ENCOUNTER — Ambulatory Visit: Payer: Self-pay | Admitting: *Deleted

## 2021-12-23 NOTE — Telephone Encounter (Signed)
  Chief Complaint: rash- glans penis- unchanged Symptoms: rash-has tried several treatments- Lotrisone, Monistat, Diflucan - no itching, pain Frequency: 6 weeks Pertinent Negatives: Patient denies pain, itching Disposition: '[]'$ ED /'[]'$ Urgent Care (no appt availability in office) / '[]'$ Appointment(In office/virtual)/ '[]'$  [facility]/ '[]'$ Home Care/ '[x]'$ Refused Recommended Disposition /'[]'$ [facility]/ '[]'$  Follow-up with PCP Additional Notes: Patient advised provider is not in office- requesting another provider call in different medication- offered to make appointment- but he does not want to come in- just wants different treatment. Patient advised I would send message- but he may get call back from office requesting he come in for appointment.

## 2021-12-23 NOTE — Telephone Encounter (Signed)
Summary: rash on penis   Pt has a Rash on his penis / tried ointment that was no good (didn't work) and then was prescribed 2 pill round of antibiotic/ he stated that didn't work either/  pt would like to know about the next plan or step / he still has the rash that he has had for a month / please advise      Reason for Disposition  Localized rash present > 7 days  Answer Assessment - Initial Assessment Questions 1. APPEARANCE of RASH: "Describe the rash."      No change with appearance 2. LOCATION: "Where is the rash located?"      Penis- red rash 3. NUMBER: "How many spots are there?"      Just on end of penis 4. SIZE: "How big are the spots?" (Inches, centimeters or compare to size of a coin)        5. ONSET: "When did the rash start?"      6 weeks 6. ITCHING: "Does the rash itch?" If Yes, ask: "How bad is the itch?"  (Scale 0-10; or none, mild, moderate, severe)     no 7. PAIN: "Does the rash hurt?" If Yes, ask: "How bad is the pain?"  (Scale 0-10; or none, mild, moderate, severe)    - NONE (0): no pain    - MILD (1-3): doesn't interfere with normal activities     - MODERATE (4-7): interferes with normal activities or awakens from sleep     - SEVERE (8-10): excruciating pain, unable to do any normal activities     no 8. OTHER SYMPTOMS: "Do you have any other symptoms?" (e.g., fever)     No other symptoms 9. PREGNANCY: "Is there any chance you are pregnant?" "When was your last menstrual period?"  Protocols used: Rash or Redness - Localized-A-AH

## 2021-12-23 NOTE — Progress Notes (Deleted)
I,Dennis Steele,acting as a Education administrator for [employer], PA-C.,have documented all relevant documentation on the behalf of Dennis Speak, PA-C,as directed by  [employer], PA-C while in the presence of [employer], PA-C.    Established patient visit   Patient: Dennis Steele   DOB: [birth year]   71 y.o. Male  MRN: [id number] Visit Date: 12/24/2021  Today's healthcare provider: [name], PA-C   No chief complaint on file. CC: penile rash  Subjective    Dennis Steele is a 71 yr old male presenting for continued rash on end of penis.  Patient was seen in office 12-09-21 and treated with topical antifungal and later called stating it was not working and requesting antibiotic.  Patient was sent in [facility] on 12-16-21.  No itching or pain, just red spots.  Onset 6 weeks ago.   Medications: Outpatient Medications Prior to Visit  Medication Sig   azelastine (ASTELIN) 0.1 % nasal spray Place 2 sprays into both nostrils 2 (two) times daily. Use in each nostril as directed   benzocaine-menthol (CHLORAEPTIC) 6-10 MG lozenge Take 1 lozenge by mouth as needed for sore throat.   benzonatate (TESSALON) 200 MG capsule Take 1 capsule (200 mg total) by mouth 2 (two) times daily as needed for cough.   calcium carbonate (TUMS - DOSED IN MG ELEMENTAL CALCIUM) 500 MG chewable tablet Chew 1 tablet by mouth daily.   cetirizine (ZYRTEC) 10 MG tablet Take 1 tablet (10 mg total) by mouth daily.   clotrimazole (LOTRIMIN) 1 % cream Apply 1 application. topically 2 (two) times daily.   fluticasone (FLONASE) 50 MCG/ACT nasal spray Place 2 sprays into both nostrils daily.   guaiFENesin-dextromethorphan (ROBITUSSIN DM) 100-10 MG/5ML syrup Take 5 mLs by mouth every 4 (four) hours as needed for cough.   ketotifen (ZADITOR) 0.025 % ophthalmic solution Place 1 drop into both eyes 2 (two) times daily.   latanoprost (XALATAN) 0.005 % ophthalmic solution Place 1 drop into the left eye at bedtime.   meloxicam (MOBIC) 7.5  MG tablet Take by mouth.   MULTIPLE VITAMIN PO Take by mouth.   rosuvastatin (CRESTOR) 10 MG tablet Take 10 mg by mouth daily.   sildenafil (VIAGRA) 100 MG tablet Take 100 mg by mouth as needed.   sodium chloride (OCEAN) 0.65 % SOLN nasal spray Place 1 spray into both nostrils as needed for congestion.   timolol (TIMOPTIC) 0.5 % ophthalmic solution Place 1 drop into the right eye every morning.   triamterene-hydrochlorothiazide (DYAZIDE) 37.5-25 MG capsule Take 1 each (1 capsule total) by mouth every morning.   Turmeric (QC TUMERIC COMPLEX PO) Take by mouth.   valACYclovir (VALTREX) 1000 MG tablet Take 1,000 mg by mouth as needed. Only for outbreaks of fever blisters   valACYclovir (VALTREX) 1000 MG tablet TAKE 2 TABLETS AT ONSET OF SYMPTOMS AND 2 TABLETS 12 HOURS LATER   valACYclovir (VALTREX) 1000 MG tablet For fever blisters - take 2 tabs at onset of outbreak then 2 more tabs 12 hours later.   No facility-administered medications prior to visit.    Review of Systems  {Labs  Heme  Chem  Endocrine  Serology  Results Review (optional):23779}   Objective    There were no vitals taken for this visit. {Show previous vital signs (optional):23777}  Physical Exam  ***  No results found for any visits on 12/24/21.  Assessment & Plan     ***  No follow-ups on file.      {provider  attestation***:1}   Dennis Steele, Hershal Coria  [facility] [phone] (phone) [phone] (fax)  [place]

## 2021-12-24 ENCOUNTER — Ambulatory Visit: Payer: Medicare Other | Admitting: Physician Assistant

## 2021-12-25 ENCOUNTER — Other Ambulatory Visit: Payer: Self-pay | Admitting: Family Medicine

## 2021-12-25 ENCOUNTER — Encounter: Payer: Self-pay | Admitting: Physician Assistant

## 2021-12-25 ENCOUNTER — Ambulatory Visit (INDEPENDENT_AMBULATORY_CARE_PROVIDER_SITE_OTHER): Payer: Medicare Other | Admitting: Physician Assistant

## 2021-12-25 VITALS — BP 102/76 | HR 78 | Temp 97.5°F | Resp 16 | Wt 202.5 lb

## 2021-12-25 DIAGNOSIS — N481 Balanitis: Secondary | ICD-10-CM | POA: Diagnosis not present

## 2021-12-25 MED ORDER — CEPHALEXIN 500 MG PO CAPS: 500.0000 mg | ORAL_CAPSULE | Freq: Two times a day (BID) | ORAL | 0 refills | Status: DC

## 2021-12-25 MED ORDER — HYDROCORTISONE 1 % EX OINT: 1.0000 "application " | TOPICAL_OINTMENT | Freq: Two times a day (BID) | CUTANEOUS | 0 refills | Status: DC

## 2021-12-25 NOTE — Progress Notes (Signed)
I,Allyn Bartelson Robinson,acting as a Education administrator for [employer], PA-C.,have documented all relevant documentation on the behalf of [name], PA-C,as directed by  [employer], PA-C while in the presence of [employer], PA-C.  Established patient visit   Patient: Dennis Steele   DOB: [birth year]   71 y.o. Male  MRN: [id number] Visit Date: 12/25/2021  Today's healthcare provider: [name], PA-C   Chief Complaint  Patient presents with   Rash   Subjective    Dennis Steele is a 71 yr old male presenting for persistent rash / redness on penis.  Treatment includes Lotrisone, Monistat, Diflucan and 1 round of oral anti-fungal antibiotic-no itching, pain. Onset 6 weeks ago.    Medications: Outpatient Medications Prior to Visit  Medication Sig   ketotifen (ZADITOR) 0.025 % ophthalmic solution Place 1 drop into both eyes 2 (two) times daily.   latanoprost (XALATAN) 0.005 % ophthalmic solution Place 1 drop into the left eye at bedtime.   meloxicam (MOBIC) 7.5 MG tablet Take by mouth.   MULTIPLE VITAMIN PO Take by mouth.   rosuvastatin (CRESTOR) 10 MG tablet Take 10 mg by mouth daily.   sildenafil (VIAGRA) 100 MG tablet Take 100 mg by mouth as needed.   sodium chloride (OCEAN) 0.65 % SOLN nasal spray Place 1 spray into both nostrils as needed for congestion.   timolol (TIMOPTIC) 0.5 % ophthalmic solution Place 1 drop into the right eye every morning.   triamterene-hydrochlorothiazide (DYAZIDE) 37.5-25 MG capsule Take 1 each (1 capsule total) by mouth every morning.   Turmeric (QC TUMERIC COMPLEX PO) Take by mouth.   valACYclovir (VALTREX) 1000 MG tablet Take 1,000 mg by mouth as needed. Only for outbreaks of fever blisters   azelastine (ASTELIN) 0.1 % nasal spray Place 2 sprays into both nostrils 2 (two) times daily. Use in each nostril as directed   benzocaine-menthol (CHLORAEPTIC) 6-10 MG lozenge Take 1 lozenge by mouth as needed for sore throat.   benzonatate (TESSALON) 200 MG capsule  Take 1 capsule (200 mg total) by mouth 2 (two) times daily as needed for cough.   calcium carbonate (TUMS - DOSED IN MG ELEMENTAL CALCIUM) 500 MG chewable tablet Chew 1 tablet by mouth daily.   cetirizine (ZYRTEC) 10 MG tablet Take 1 tablet (10 mg total) by mouth daily.   clotrimazole (LOTRIMIN) 1 % cream Apply 1 application. topically 2 (two) times daily.   fluticasone (FLONASE) 50 MCG/ACT nasal spray Place 2 sprays into both nostrils daily.   guaiFENesin-dextromethorphan (ROBITUSSIN DM) 100-10 MG/5ML syrup Take 5 mLs by mouth every 4 (four) hours as needed for cough.   valACYclovir (VALTREX) 1000 MG tablet TAKE 2 TABLETS AT ONSET OF SYMPTOMS AND 2 TABLETS 12 HOURS LATER   valACYclovir (VALTREX) 1000 MG tablet For fever blisters - take 2 tabs at onset of outbreak then 2 more tabs 12 hours later.   No facility-administered medications prior to visit.    Review of Systems  Skin:        Persistent redness of the gland of penis  All other systems reviewed and are negative.     Objective    BP 102/76 (BP Location: Left Arm, Patient Position: Sitting, Cuff Size: Normal)   Pulse 78   Temp (!) 97.5 F (36.4 C) (Oral)   Resp 16   Wt 202 lb 8 oz (91.9 kg)   SpO2 99%   BMI 28.24 kg/m    Physical Exam Vitals reviewed.  Constitutional:  Appearance: Normal appearance.  HENT:     Head: Atraumatic.  Genitourinary:    Testes: Normal.     Rectum: Normal.     Comments: Persistent erythema of the gland pubis  No discharge, no swelling, no tenderness on p, no pruritis No enlargement of inguinal lymph nodes appreciated Musculoskeletal:        General: Normal range of motion.  Neurological:     Mental Status: He is alert and oriented to person, place, and time.  Psychiatric:        Behavior: Behavior normal.        Thought Content: Thought content normal.        Judgment: Judgment normal.     No results found for any visits on 12/25/21.  Assessment & Plan     1.  Balanitis? Normal vitals Pt is prediabetic, last A1C was 5.9 - cephALEXin (KEFLEX) 500 MG capsule; Take 1 capsule (500 mg total) by mouth 2 (two) times daily.  Dispense: 10 capsule; Refill: 0 - hydrocortisone 1 % ointment; Apply 1 application. topically 2 (two) times daily.  Dispense: 30 g; Refill: 0 Warm compresses and local hygiene  If symptoms worsen or not improve, might need to see Dr. Rosanna Randy or Urology   FU as needed The patient was advised to call back or seek an in-person evaluation if the symptoms worsen or if the condition fails to improve as anticipated.  I discussed the assessment and treatment plan with the patient. The patient was provided an opportunity to ask questions and all were answered. The patient agreed with the plan and demonstrated an understanding of the instructions.  The entirety of the information documented in the History of Present Illness, Review of Systems and Physical Exam were personally obtained by me. Portions of this information were initially documented by the CMA and reviewed by me for thoroughness and accuracy.  Portions of this note were created using dictation software and may contain typographical errors.   [name], PA-C  [facility] [phone] (phone) [phone] (fax)  [place]

## 2022-01-04 ENCOUNTER — Encounter: Payer: Self-pay | Admitting: Family Medicine

## 2022-01-04 ENCOUNTER — Ambulatory Visit: Payer: Self-pay | Admitting: Family Medicine

## 2022-01-04 ENCOUNTER — Ambulatory Visit (INDEPENDENT_AMBULATORY_CARE_PROVIDER_SITE_OTHER): Payer: Medicare Other | Admitting: Family Medicine

## 2022-01-04 ENCOUNTER — Ambulatory Visit: Payer: Medicare Other | Admitting: Family Medicine

## 2022-01-04 VITALS — BP 127/87 | HR 65 | Resp 16 | Wt 205.0 lb

## 2022-01-04 DIAGNOSIS — E7800 Pure hypercholesterolemia, unspecified: Secondary | ICD-10-CM

## 2022-01-04 DIAGNOSIS — H4010X0 Unspecified open-angle glaucoma, stage unspecified: Secondary | ICD-10-CM

## 2022-01-04 DIAGNOSIS — M6530 Trigger finger, unspecified finger: Secondary | ICD-10-CM

## 2022-01-04 DIAGNOSIS — I10 Essential (primary) hypertension: Secondary | ICD-10-CM | POA: Diagnosis not present

## 2022-01-04 DIAGNOSIS — R42 Dizziness and giddiness: Secondary | ICD-10-CM | POA: Diagnosis not present

## 2022-01-04 MED ORDER — TRIAMTERENE-HCTZ 37.5-25 MG PO CAPS: 1.0000 | ORAL_CAPSULE | ORAL | 3 refills | Status: DC

## 2022-01-04 MED ORDER — MECLIZINE HCL 25 MG PO TABS: 25.0000 mg | ORAL_TABLET | Freq: Three times a day (TID) | ORAL | 1 refills | Status: DC | PRN

## 2022-01-04 NOTE — Telephone Encounter (Signed)
  Chief Complaint: vertigo Symptoms: spinning dizziness Frequency: constant for 3 days Pertinent Negatives: Patient denies numbness  Disposition: '[]'$ ED /'[]'$ Urgent Care (no appt availability in office) / '[x]'$ Appointment(In office/virtual)/ '[]'$  [facility]/ '[]'$ Home Care/ '[]'$ Refused Recommended Disposition /'[]'$ [facility]/ '[]'$  Follow-up with PCP Additional Notes: Pt states had this once before and some medication helped. Has a driver to the appt this afternoon.  Reason for Disposition  [1] MODERATE dizziness (e.g., vertigo; feels very unsteady, interferes with normal activities) AND [2] has NOT been evaluated by physician for this  Answer Assessment - Initial Assessment Questions 1. DESCRIPTION: "Describe your dizziness."     Unstable walking, worse today 2. VERTIGO: "Do you feel like either you or the room is spinning or tilting?"      spinning 3. LIGHTHEADED: "Do you feel lightheaded?" (e.g., somewhat faint, woozy, weak upon standing)     weak 4. SEVERITY: "How bad is it?"  "Can you walk?"   - MILD: Feels slightly dizzy and unsteady, but is walking normally.   - MODERATE: Feels unsteady when walking, but not falling; interferes with normal activities (e.g., school, work).   - SEVERE: Unable to walk without falling, or requires assistance to walk without falling.     7 5. ONSET:  "When did the dizziness begin?"     2 days 6. AGGRAVATING FACTORS: "Does anything make it worse?" (e.g., standing, change in head position)     Feels worse today  7. CAUSE: "What do you think is causing the dizziness?"     Had vertigo before 8. RECURRENT SYMPTOM: "Have you had dizziness before?" If Yes, ask: "When was the last time?" "What happened that time?"     yes 9. OTHER SYMPTOMS: "Do you have any other symptoms?" (e.g., headache, weakness, numbness, vomiting, earache) No      10. PREGNANCY: "Is there any chance you are pregnant?" "When was your last menstrual period?"        na  Protocols used: Dizziness - Vertigo-A-AH

## 2022-01-04 NOTE — Patient Instructions (Signed)
Please get an [employer] or [employer] blood pressure cuff for home use.  Check blood pressure every morning for the next few mornings and if the systolic (high number) is less than 100 then please hold the blood pressure medicine for that day.

## 2022-01-04 NOTE — Progress Notes (Unsigned)
Established patient visit  I,[name],acting as a scribe for Dennis Durie, MD.,have documented all relevant documentation on the behalf of Dennis Durie, MD,as directed by  Dennis Durie, MD while in the presence of Dennis Durie, MD.   Patient: Dennis Steele   DOB: 08/30/1950   71 y.o. Male  MRN: [id number] Visit Date: 01/04/2022  Today's healthcare provider: [name], MD   Chief Complaint  Patient presents with   Dizziness   Subjective    HPI  Patient has had vertigo/dizziness for 2 days. Patient states he became dizzy Sunday morning. Patient states the room was spinning and he vomited for 2 hours. Patient states he had another episode this morning where he began vomiting. Patient is still having dizziness.   The dizziness does improve at times.  It is intermittent and seems to be worse when he moves his head to the side quickly.  He feels a little unsteady with his gait had some nausea and vomiting with this.  A lot of this is better today.  The unsteadiness was worse yesterday. The balanitis he had is much improved/resolved with treatment from Ms Oswalt.  Medications: Outpatient Medications Prior to Visit  Medication Sig   hydrocortisone 1 % ointment Apply 1 application. topically 2 (two) times daily.   ketotifen (ZADITOR) 0.025 % ophthalmic solution Place 1 drop into both eyes 2 (two) times daily.   latanoprost (XALATAN) 0.005 % ophthalmic solution Place 1 drop into the left eye at bedtime.   meloxicam (MOBIC) 7.5 MG tablet TAKE ONE TABLET BY MOUTH TWICE DAILY AS NEEDED FOR PAIN   MULTIPLE VITAMIN PO Take by mouth.   rosuvastatin (CRESTOR) 10 MG tablet Take 10 mg by mouth daily.   sildenafil (VIAGRA) 100 MG tablet Take 100 mg by mouth as needed.   sodium chloride (OCEAN) 0.65 % SOLN nasal spray Place 1 spray into both nostrils as needed for congestion.   timolol (TIMOPTIC) 0.5 % ophthalmic solution Place 1 drop into the right eye every  morning.   triamterene-hydrochlorothiazide (DYAZIDE) 37.5-25 MG capsule Take 1 each (1 capsule total) by mouth every morning.   Turmeric (QC TUMERIC COMPLEX PO) Take by mouth.   valACYclovir (VALTREX) 1000 MG tablet Take 1,000 mg by mouth as needed. Only for outbreaks of fever blisters   [DISCONTINUED] cephALEXin (KEFLEX) 500 MG capsule Take 1 capsule (500 mg total) by mouth 2 (two) times daily. (Patient not taking: Reported on 01/04/2022)   No facility-administered medications prior to visit.    Review of Systems  Last lipids Lab Results  Component Value Date   CHOL 178 09/11/2021   HDL 45 09/11/2021   LDLCALC 112 (H) 09/11/2021   TRIG 115 09/11/2021   CHOLHDL 4.0 09/11/2021       Objective    BP 127/87 (BP Location: Right Arm, Patient Position: Sitting, Cuff Size: Large)   Pulse 65   Resp 16   Wt 205 lb (93 kg)   SpO2 97%   BMI 28.59 kg/m  BP Readings from Last 3 Encounters:  01/04/22 127/87  12/25/21 102/76  12/09/21 136/79   Wt Readings from Last 3 Encounters:  01/04/22 205 lb (93 kg)  12/25/21 202 lb 8 oz (91.9 kg)  12/09/21 208 lb (94.3 kg)      Physical Exam Vitals reviewed.  Constitutional:      Appearance: He is well-developed.  HENT:     Head: Normocephalic and atraumatic.  Right Ear: External ear normal.     Left Ear: External ear normal.  Eyes:     General: No scleral icterus.    Conjunctiva/sclera: Conjunctivae normal.  Neck:     Thyroid: No thyromegaly.  Cardiovascular:     Rate and Rhythm: Normal rate and regular rhythm.     Heart sounds: Normal heart sounds.  Pulmonary:     Effort: Pulmonary effort is normal.  Abdominal:     Palpations: Abdomen is soft.  Skin:    General: Skin is warm and dry.  Neurological:     General: No focal deficit present.     Mental Status: He is alert and oriented to person, place, and time.     Cranial Nerves: No cranial nerve deficit.     Motor: No weakness.     Coordination: Coordination normal.      Gait: Gait normal.     Comments: Normal Romberg.  Finger-to-nose is normal. Gait is normal.  Psychiatric:        Behavior: Behavior normal.        Thought Content: Thought content normal.        Judgment: Judgment normal.       No results found for any visits on 01/04/22.  Assessment & Plan     1. Vertigo Presumed vertigo will treat with Antivert 25 mg every 8 hours as needed.  Push fluids. I do not at all think this is a stroke.  Consider referral to neurology if it persists. - meclizine (ANTIVERT) 25 MG tablet; Take 1 tablet (25 mg total) by mouth 3 (three) times daily as needed for dizziness.  Dispense: 30 tablet; Refill: 1  2. Dizziness Have patient push fluids to deal with possible dehydration on top of his other symptoms. No palpitations or indication that this could be cardiac in origin. 3. Primary hypertension Excellent control - triamterene-hydrochlorothiazide (DYAZIDE) 37.5-25 MG capsule; Take 1 each (1 capsule total) by mouth every morning.  Dispense: 90 capsule; Refill: 3  4. Trigger finger of left hand, unspecified finger Refer to hand surgeon Dr. Peggye Ley as patient is having significant problems with day-to-day activities with - AMB referral to orthopedics  5. Pure hypercholesterolemia   6. Open-angle glaucoma, unspecified glaucoma stage, unspecified laterality, unspecified open-angle glaucoma type    No follow-ups on file.      I, Dennis Durie, MD, have reviewed all documentation for this visit. The documentation on 01/06/22 for the exam, diagnosis, procedures, and orders are all accurate and complete.    Meegan Shanafelt Cranford Mon, MD  [facility] [phone] (phone) [phone] (fax)  [place]

## 2022-01-06 DIAGNOSIS — M9902 Segmental and somatic dysfunction of thoracic region: Secondary | ICD-10-CM | POA: Diagnosis not present

## 2022-01-06 DIAGNOSIS — M9903 Segmental and somatic dysfunction of lumbar region: Secondary | ICD-10-CM | POA: Diagnosis not present

## 2022-01-06 DIAGNOSIS — M62830 Muscle spasm of back: Secondary | ICD-10-CM | POA: Diagnosis not present

## 2022-01-06 DIAGNOSIS — M5136 Other intervertebral disc degeneration, lumbar region: Secondary | ICD-10-CM | POA: Diagnosis not present

## 2022-01-07 ENCOUNTER — Ambulatory Visit: Payer: Self-pay | Admitting: *Deleted

## 2022-01-07 ENCOUNTER — Other Ambulatory Visit: Payer: Self-pay | Admitting: *Deleted

## 2022-01-07 MED ORDER — DIAZEPAM 2 MG PO TABS: 2.0000 mg | ORAL_TABLET | Freq: Three times a day (TID) | ORAL | 2 refills | Status: DC | PRN

## 2022-01-07 NOTE — Telephone Encounter (Signed)
Patient was advised and would like to try diazepam.

## 2022-01-07 NOTE — Telephone Encounter (Signed)
Message from [place] sent at 01/07/2022 12:30 PM EDT  Summary: dizziness   Patient called in  still having dizziness since his appt on 06/12, the med (didn't give name of medhe was given, doesn't seem to be working. Wants call back for further assistance.           Call History   Type Contact Phone/Fax User  01/07/2022 12:28 PM EDT Phone (Incoming) Ina, Poupard (Self) [phone] Jerilynn Mages) [place], Gerlene Burdock   Reason for Disposition  [1] MODERATE dizziness (e.g., vertigo; feels very unsteady, interferes with normal activities) AND [2] has been evaluated by physician for this  Answer Assessment - Initial Assessment Questions 1. DESCRIPTION: "Describe your dizziness."     Seen 01/04/2022 for vertigo.   Given medication which helped.   Until the morning and I was playing golf and got dizzy again.   I came home and called you.  The pharmacist gave me Frederich Chick is same thing as Meclizine 25 mg rx and is cheaper.   I took one this morning  and another one playing golf. 2. VERTIGO: "Do you feel like either you or the room is spinning or tilting?"      I'm leaving town next [employer]. Flying.  Is there something else I can take.  Tomorrow I'm leaving town but wife is driving.    I'm going to see grandchildren.    3. LIGHTHEADED: "Do you feel lightheaded?" (e.g., somewhat faint, woozy, weak upon standing)     No 4. SEVERITY: "How bad is it?"  "Can you walk?"   - MILD: Feels slightly dizzy and unsteady, but is walking normally.   - MODERATE: Feels unsteady when walking, but not falling; interferes with normal activities (e.g., school, work).   - SEVERE: Unable to walk without falling, or requires assistance to walk without falling.     Same as when I was in the office Monday. 5. ONSET:  "When did the dizziness begin?"     This morning while out playing golf.   It hit me just as I stepped out of the golf cart so I had to sit back down.   6. AGGRAVATING FACTORS: "Does anything make it worse?"  (e.g., standing, change in head position)     Not asked 7. CAUSE: "What do you think is causing the dizziness?"     The vertigo Dr. Carmin Richmond said I had but this medication was helping but not now. 8. RECURRENT SYMPTOM: "Have you had dizziness before?" If Yes, ask: "When was the last time?" "What happened that time?"     I had vertigo 20 yrs ago 9. OTHER SYMPTOMS: "Do you have any other symptoms?" (e.g., headache, weakness, numbness, vomiting, earache)     No not today including no nausea which I had on Monday 10. PREGNANCY: "Is there any chance you are pregnant?" "When was your last menstrual period?"       N/A  Protocols used: Dizziness - Vertigo-A-AH

## 2022-01-07 NOTE — Telephone Encounter (Signed)
If it helps his symptoms that is a good response.  It is not an antibiotic, does not cure it, hopefully time will take care of this over the next couple of days.  If it was no response we can try diazepam 2 mg every 8 hours.

## 2022-01-07 NOTE — Telephone Encounter (Signed)
  Chief Complaint: The dizziness/vertigo resumed today after getting better with medication.   (Seen 01/04/2022) Symptoms: The vertigo/dizziness is just like when I was in the office Monday    Frequency: Started again this morning while playing golf. Pertinent Negatives: Patient denies nausea or other symptoms. Disposition: '[]'$ ED /'[]'$ Urgent Care (no appt availability in office) / '[]'$ Appointment(In office/virtual)/ '[]'$  [facility]/ '[]'$ Home Care/ '[]'$ Refused Recommended Disposition /'[]'$ [facility]/ '[x]'$  Follow-up with PCP Additional Notes: I've sent message to Dr. Rosanna Randy to see if there is another medication he can try.   Pt. Agreeable to someone calling him back.

## 2022-02-01 ENCOUNTER — Encounter: Payer: Self-pay | Admitting: Family Medicine

## 2022-02-01 ENCOUNTER — Ambulatory Visit (INDEPENDENT_AMBULATORY_CARE_PROVIDER_SITE_OTHER): Payer: Medicare Other | Admitting: Family Medicine

## 2022-02-01 VITALS — BP 124/77 | HR 70 | Resp 16 | Wt 206.0 lb

## 2022-02-01 DIAGNOSIS — E7800 Pure hypercholesterolemia, unspecified: Secondary | ICD-10-CM | POA: Diagnosis not present

## 2022-02-01 DIAGNOSIS — I10 Essential (primary) hypertension: Secondary | ICD-10-CM | POA: Diagnosis not present

## 2022-02-01 DIAGNOSIS — R42 Dizziness and giddiness: Secondary | ICD-10-CM | POA: Diagnosis not present

## 2022-02-01 NOTE — Progress Notes (Signed)
Established patient visit  I,[name],acting as a scribe for [name], MD.,have documented all relevant documentation on the behalf of [name], MD,as directed by  [name], MD while in the presence of [name], MD.   Patient: Dennis Steele   DOB: 05/22/1951   71 y.o. Male  MRN: [id number] Visit Date: 02/01/2022  Today's healthcare provider: [name], MD   Chief Complaint  Patient presents with   Follow-up   Hypertension   Subjective    HPI  Patient comes in today for follow-up.  He is tolerating Dyazide well and his vertigo has resolved.  He is off of meclizine and diazepam.   Hypertension, follow-up  BP Readings from Last 3 Encounters:  02/01/22 124/77  01/04/22 127/87  12/25/21 102/76   Wt Readings from Last 3 Encounters:  02/01/22 206 lb (93.4 kg)  01/04/22 205 lb (93 kg)  12/25/21 202 lb 8 oz (91.9 kg)     He was last seen for hypertension 2 months ago.  BP at that visit was as above. Management since that visit includes; stopping Lisinopril and starting Dyazide daily.  Outside blood pressures are not checking.  Pertinent labs Lab Results  Component Value Date   CHOL 178 09/11/2021   HDL 45 09/11/2021   LDLCALC 112 (H) 09/11/2021   TRIG 115 09/11/2021   CHOLHDL 4.0 09/11/2021   Lab Results  Component Value Date   NA 141 09/11/2021   K 4.5 09/11/2021   CREATININE 0.92 09/11/2021   EGFR 88 09/11/2021   GLUCOSE 108 (H) 09/11/2021   TSH 1.700 08/26/2021     The 10-year ASCVD risk score (Arnett DK, et al., 2019) is: 24.9%  ---------------------------------------------------------------------------------------------------   Medications: Outpatient Medications Prior to Visit  Medication Sig   latanoprost (XALATAN) 0.005 % ophthalmic solution Place 1 drop into the left eye at bedtime.   meclizine (ANTIVERT) 25 MG tablet Take 1 tablet (25 mg total) by mouth 3 (three) times daily as needed for  dizziness.   meloxicam (MOBIC) 7.5 MG tablet TAKE ONE TABLET BY MOUTH TWICE DAILY AS NEEDED FOR PAIN   MULTIPLE VITAMIN PO Take by mouth.   rosuvastatin (CRESTOR) 10 MG tablet Take 10 mg by mouth daily.   sildenafil (VIAGRA) 100 MG tablet Take 100 mg by mouth as needed.   sodium chloride (OCEAN) 0.65 % SOLN nasal spray Place 1 spray into both nostrils as needed for congestion.   timolol (TIMOPTIC) 0.5 % ophthalmic solution Place 1 drop into the right eye every morning.   triamterene-hydrochlorothiazide (DYAZIDE) 37.5-25 MG capsule Take 1 each (1 capsule total) by mouth every morning.   Turmeric (QC TUMERIC COMPLEX PO) Take by mouth.   valACYclovir (VALTREX) 1000 MG tablet Take 1,000 mg by mouth as needed. Only for outbreaks of fever blisters   [DISCONTINUED] diazepam (VALIUM) 2 MG tablet Take 1 tablet (2 mg total) by mouth every 8 (eight) hours as needed for anxiety. (Patient not taking: Reported on 02/01/2022)   [DISCONTINUED] hydrocortisone 1 % ointment Apply 1 application. topically 2 (two) times daily. (Patient not taking: Reported on 02/01/2022)   [DISCONTINUED] ketotifen (ZADITOR) 0.025 % ophthalmic solution Place 1 drop into both eyes 2 (two) times daily. (Patient not taking: Reported on 02/01/2022)   No facility-administered medications prior to visit.    Review of Systems  Last lipids Lab Results  Component Value Date   CHOL 178 09/11/2021   HDL 45 09/11/2021  LDLCALC 112 (H) 09/11/2021   TRIG 115 09/11/2021   CHOLHDL 4.0 09/11/2021       Objective    BP 124/77 (BP Location: Left Arm, Patient Position: Sitting, Cuff Size: Large)   Pulse 70   Resp 16   Wt 206 lb (93.4 kg)   SpO2 97%   BMI 28.73 kg/m  BP Readings from Last 3 Encounters:  02/01/22 124/77  01/04/22 127/87  12/25/21 102/76   Wt Readings from Last 3 Encounters:  02/01/22 206 lb (93.4 kg)  01/04/22 205 lb (93 kg)  12/25/21 202 lb 8 oz (91.9 kg)      Physical Exam Vitals reviewed.  Constitutional:       General: He is not in acute distress.    Appearance: He is well-developed.  HENT:     Head: Normocephalic and atraumatic.     Right Ear: Hearing normal.     Left Ear: Hearing normal.     Nose: Nose normal.  Eyes:     General: Lids are normal. No scleral icterus.       Right eye: No discharge.        Left eye: No discharge.     Conjunctiva/sclera: Conjunctivae normal.  Cardiovascular:     Rate and Rhythm: Normal rate and regular rhythm.     Heart sounds: Normal heart sounds.  Pulmonary:     Effort: Pulmonary effort is normal. No respiratory distress.  Skin:    Findings: No lesion or rash.  Neurological:     General: No focal deficit present.     Mental Status: He is alert and oriented to person, place, and time.  Psychiatric:        Mood and Affect: Mood normal.        Speech: Speech normal.        Behavior: Behavior normal.        Thought Content: Thought content normal.        Judgment: Judgment normal.       No results found for any visits on 02/01/22.  Assessment & Plan    1. Primary hypertension Good control.  Check renal panel on Dyazide, now off of lisinopril.  Follow-up 6 months for recheck on blood pressure - Renal function panel  2. Pure hypercholesterolemia Lipid panel - Lipid panel  3. Vertigo Resolved completely   No follow-ups on file.      I, [name], MD, have reviewed all documentation for this visit. The documentation on 02/01/22 for the exam, diagnosis, procedures, and orders are all accurate and complete.    Roselinda Bahena Cranford Mon, MD  [facility] [phone] (phone) [phone] (fax)  [place]

## 2022-02-03 DIAGNOSIS — E7800 Pure hypercholesterolemia, unspecified: Secondary | ICD-10-CM | POA: Diagnosis not present

## 2022-02-03 DIAGNOSIS — I10 Essential (primary) hypertension: Secondary | ICD-10-CM | POA: Diagnosis not present

## 2022-02-04 LAB — RENAL FUNCTION PANEL
Albumin: 4.4 g/dL (ref 3.8–4.8)
BUN/Creatinine Ratio: 21 (ref 10–24)
BUN: 21 mg/dL (ref 8–27)
CO2: 22 mmol/L (ref 20–29)
Calcium: 9.7 mg/dL (ref 8.6–10.2)
Chloride: 102 mmol/L (ref 96–106)
Creatinine, Ser: 0.99 mg/dL (ref 0.76–1.27)
Glucose: 102 mg/dL — ABNORMAL HIGH (ref 70–99)
Phosphorus: 3.6 mg/dL (ref 2.8–4.1)
Potassium: 4.7 mmol/L (ref 3.5–5.2)
Sodium: 141 mmol/L (ref 134–144)
eGFR: 80 mL/min/{1.73_m2} (ref >59)

## 2022-02-04 LAB — LIPID PANEL
Chol/HDL Ratio: 3.2 ratio (ref 0.0–5.0)
Cholesterol, Total: 152 mg/dL (ref 100–199)
HDL: 48 mg/dL (ref >39)
LDL Chol Calc (NIH): 76 mg/dL (ref 0–99)
Triglycerides: 164 mg/dL — ABNORMAL HIGH (ref 0–149)
VLDL Cholesterol Cal: 28 mg/dL (ref 5–40)

## 2022-02-08 DIAGNOSIS — H4010X3 Unspecified open-angle glaucoma, severe stage: Secondary | ICD-10-CM | POA: Diagnosis not present

## 2022-02-08 DIAGNOSIS — H401321 Pigmentary glaucoma, left eye, mild stage: Secondary | ICD-10-CM | POA: Diagnosis not present

## 2022-02-09 DIAGNOSIS — M65322 Trigger finger, left index finger: Secondary | ICD-10-CM | POA: Diagnosis not present

## 2022-02-09 DIAGNOSIS — M720 Palmar fascial fibromatosis [Dupuytren]: Secondary | ICD-10-CM | POA: Diagnosis not present

## 2022-02-10 ENCOUNTER — Ambulatory Visit: Payer: Medicare Other | Admitting: Family Medicine

## 2022-02-10 DIAGNOSIS — M9903 Segmental and somatic dysfunction of lumbar region: Secondary | ICD-10-CM | POA: Diagnosis not present

## 2022-02-10 DIAGNOSIS — M9902 Segmental and somatic dysfunction of thoracic region: Secondary | ICD-10-CM | POA: Diagnosis not present

## 2022-02-10 DIAGNOSIS — M5136 Other intervertebral disc degeneration, lumbar region: Secondary | ICD-10-CM | POA: Diagnosis not present

## 2022-02-10 DIAGNOSIS — M62830 Muscle spasm of back: Secondary | ICD-10-CM | POA: Diagnosis not present

## 2022-02-11 ENCOUNTER — Ambulatory Visit: Payer: Medicare Other | Admitting: Family Medicine

## 2022-02-12 ENCOUNTER — Encounter: Payer: Self-pay | Admitting: *Deleted

## 2022-03-09 ENCOUNTER — Ambulatory Visit (INDEPENDENT_AMBULATORY_CARE_PROVIDER_SITE_OTHER): Payer: Medicare Other | Admitting: Physician Assistant

## 2022-03-09 ENCOUNTER — Encounter: Payer: Self-pay | Admitting: Physician Assistant

## 2022-03-09 VITALS — BP 129/77 | HR 71 | Temp 98.3°F | Resp 16 | Wt 208.0 lb

## 2022-03-09 DIAGNOSIS — R0981 Nasal congestion: Secondary | ICD-10-CM

## 2022-03-09 DIAGNOSIS — R051 Acute cough: Secondary | ICD-10-CM

## 2022-03-09 LAB — POCT INFLUENZA A/B
Influenza A, POC: NEGATIVE
Influenza B, POC: NEGATIVE

## 2022-03-09 LAB — POCT RAPID STREP A (OFFICE): Rapid Strep A Screen: NEGATIVE

## 2022-03-09 MED ORDER — BENZONATATE 200 MG PO CAPS: 200.0000 mg | ORAL_CAPSULE | Freq: Two times a day (BID) | ORAL | 0 refills | Status: DC | PRN

## 2022-03-09 MED ORDER — CHLORASEPTIC 6-10 MG MT LOZG: 1.0000 | LOZENGE | OROMUCOSAL | 0 refills | Status: DC | PRN

## 2022-03-09 MED ORDER — FLUTICASONE PROPIONATE 50 MCG/ACT NA SUSP: 2.0000 | Freq: Every day | NASAL | 6 refills | Status: AC

## 2022-03-09 MED ORDER — GUAIFENESIN-DM 100-10 MG/5ML PO SYRP: 5.0000 mL | ORAL_SOLUTION | ORAL | 0 refills | Status: DC | PRN

## 2022-03-09 NOTE — Progress Notes (Signed)
I,Nima Kemppainen Robinson,acting as a Education administrator for [employer], PA-C.,have documented all relevant documentation on the behalf of [name], PA-C,as directed by  [employer], PA-C while in the presence of [employer], PA-C.   Established patient visit   Patient: Dennis Steele   DOB: [birth year]   71 y.o. Male  MRN: [id number] Visit Date: 03/09/2022  Today's healthcare provider: [name], PA-C   Chief Complaint  Patient presents with   Sore Throat   Subjective    Upper respiratory symptoms He complains of non productive cough and sore throat.with no fever, chills, night sweats or weight loss. Onset of symptoms was a few days ago and staying constant.He is drinking plenty of fluids.  Past history is significant for no history of pneumonia or bronchitis. Patient is non-smoker. Using Azelastine HCI stray with minimal relief.  Reports he is prone to sinus infections.    ---------------------------------------------------------------------------------------------------   Medications: Outpatient Medications Prior to Visit  Medication Sig   latanoprost (XALATAN) 0.005 % ophthalmic solution Place 1 drop into the left eye at bedtime.   meclizine (ANTIVERT) 25 MG tablet Take 1 tablet (25 mg total) by mouth 3 (three) times daily as needed for dizziness.   meloxicam (MOBIC) 7.5 MG tablet TAKE ONE TABLET BY MOUTH TWICE DAILY AS NEEDED FOR PAIN   MULTIPLE VITAMIN PO Take by mouth.   rosuvastatin (CRESTOR) 10 MG tablet Take 10 mg by mouth daily.   sildenafil (VIAGRA) 100 MG tablet Take 100 mg by mouth as needed.   sodium chloride (OCEAN) 0.65 % SOLN nasal spray Place 1 spray into both nostrils as needed for congestion.   timolol (TIMOPTIC) 0.5 % ophthalmic solution Place 1 drop into the right eye every morning.   triamterene-hydrochlorothiazide (DYAZIDE) 37.5-25 MG capsule Take 1 each (1 capsule total) by mouth every morning.   Turmeric (QC TUMERIC COMPLEX PO) Take by mouth.   valACYclovir  (VALTREX) 1000 MG tablet Take 1,000 mg by mouth as needed. Only for outbreaks of fever blisters   No facility-administered medications prior to visit.    Review of Systems  Constitutional:  Positive for activity change. Negative for appetite change, chills, diaphoresis, fatigue and fever.  HENT:  Positive for congestion, sinus pressure, sneezing and sore throat. Negative for ear discharge, ear pain, facial swelling, hearing loss and sinus pain.   Psychiatric/Behavioral:  Positive for agitation.   Asking if he will be contagious     Objective    BP 129/77 (BP Location: Right Arm, Patient Position: Sitting, Cuff Size: Normal)   Pulse 71   Temp 98.3 F (36.8 C) (Oral)   Resp 16   Wt 208 lb (94.3 kg)   SpO2 98%   BMI 29.01 kg/m    Physical Exam Vitals reviewed.  Constitutional:      General: He is not in acute distress.    Appearance: Normal appearance. He is not diaphoretic.  HENT:     Head: Normocephalic and atraumatic.     Right Ear: Tympanic membrane and ear canal normal. No drainage, swelling or tenderness. No middle ear effusion. Tympanic membrane is not erythematous.     Left Ear: Tympanic membrane and ear canal normal. No drainage, swelling or tenderness.  No middle ear effusion. Tympanic membrane is not erythematous.     Nose: Congestion (R>L) and rhinorrhea present.     Mouth/Throat:     Mouth: No oral lesions.     Pharynx: Pharyngeal swelling and posterior oropharyngeal erythema (mild) present. No  oropharyngeal exudate or uvula swelling.     Tonsils: 2+ on the right. 2+ on the left.  Eyes:     General: No scleral icterus.    Conjunctiva/sclera: Conjunctivae normal.  Cardiovascular:     Rate and Rhythm: Normal rate and regular rhythm.     Pulses: Normal pulses.     Heart sounds: Normal heart sounds. No murmur heard. Pulmonary:     Effort: Pulmonary effort is normal. No respiratory distress.     Breath sounds: Normal breath sounds. No wheezing or rhonchi.   Musculoskeletal:     Cervical back: Normal range of motion and neck supple.     Right lower leg: No edema.     Left lower leg: No edema.  Lymphadenopathy:     Cervical: No cervical adenopathy.  Skin:    General: Skin is warm and dry.     Findings: No rash.  Neurological:     General: No focal deficit present.     Mental Status: He is alert and oriented to person, place, and time. Mental status is at baseline.  Psychiatric:        Mood and Affect: Mood normal.        Behavior: Behavior normal.     No results found for any visits on 03/09/22.  Assessment & Plan     1. Acute cough Could be due to URI, bronchitis Normal vitals and normal lung PE - guaiFENesin-dextromethorphan (ROBITUSSIN DM) 100-10 MG/5ML syrup; Take 5 mLs by mouth every 4 (four) hours as needed for cough.  Dispense: 118 mL; Refill: 0 - benzonatate (TESSALON) 200 MG capsule; Take 1 capsule (200 mg total) by mouth 2 (two) times daily as needed for cough.  Dispense: 20 capsule; Refill: 0 - benzocaine-menthol (CHLORASEPTIC) 6-10 MG lozenge; Take 1 lozenge by mouth as needed for sore throat.  Dispense: 100 tablet; Refill: 0  2. Congestion of nasal sinus Could be due to sinusitis x 3 days Recommended nasal saline rinse, drink a lot of liquids - fluticasone (FLONASE) 50 MCG/ACT nasal spray; Place 2 sprays into both nostrils daily.  Dispense: 16 g; Refill: 6 Hx of chronic sinusitis  Viral URI is contagious. Encouraged respiratory hygiene/cough etiquette. Masking for close contact and separation/at least up to 3 feet away from others. Frequent hand washing advised.  FU PRN     The patient was advised to call back or seek an in-person evaluation if the symptoms worsen or if the condition fails to improve as anticipated.  I discussed the assessment and treatment plan with the patient. The patient was provided an opportunity to ask questions and all were answered. The patient agreed with the plan and demonstrated an  understanding of the instructions.  The entirety of the information documented in the History of Present Illness, Review of Systems and Physical Exam were personally obtained by me. Portions of this information were initially documented by the CMA and reviewed by me for thoroughness and accuracy.  Portions of this note were created using dictation software and may contain typographical errors.    [name], PA-C  [facility] [phone] (phone) [phone] (fax)  [place]

## 2022-03-24 IMAGING — US US ABDOMINAL AORTA SCREENING AAA
1 series · 14 of 21 positions shown · non-contrast
Comparison: None.

CLINICAL DATA: Male between 65-75 years of age with a smoking
history.

EXAM:
US ABDOMINAL AORTA MEDICARE SCREENING
TECHNIQUE: Ultrasound examination of the abdominal aorta was performed as a
screening evaluation for abdominal aortic aneurysm.

[Series 1: us abdominal aorta screening aaa · 0.28mm/px · 14 of 21 slices shown]
[im 1/21]
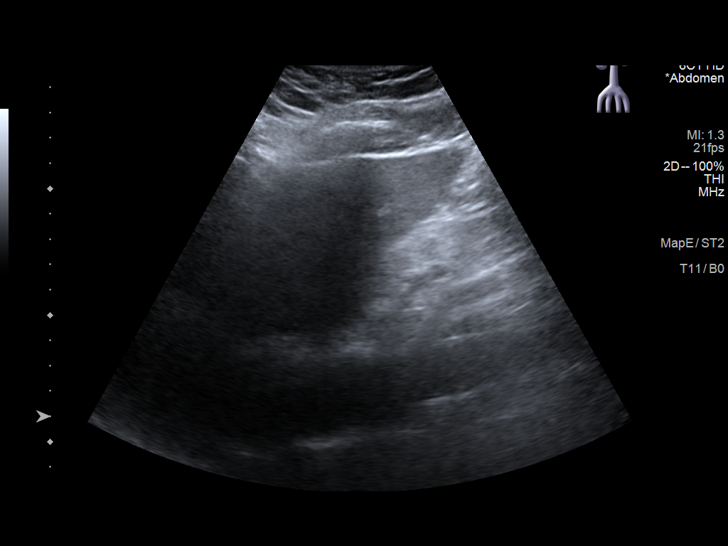
[im 3/21]
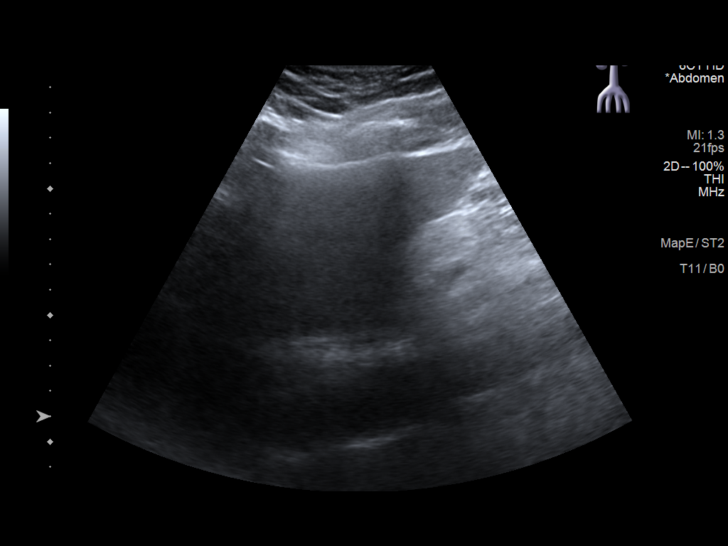
[im 4/21]
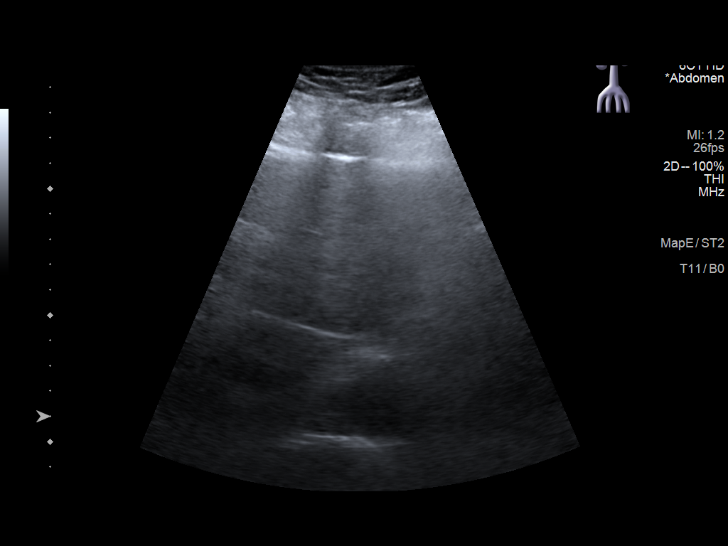
[im 6/21]
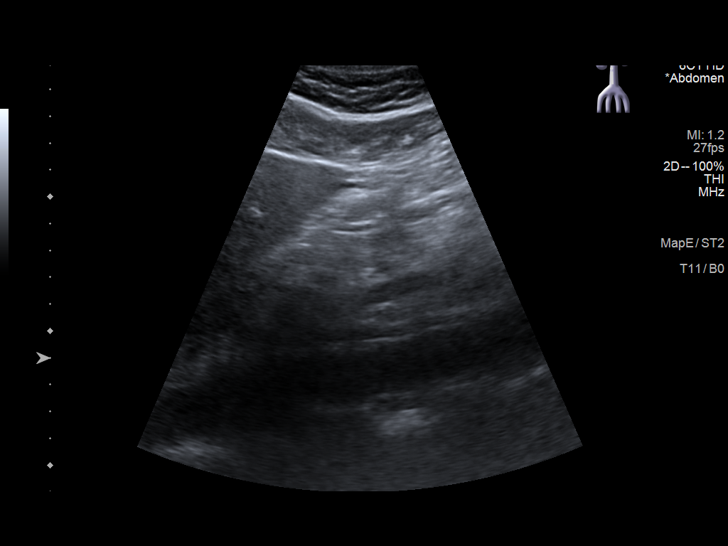
[im 7/21]
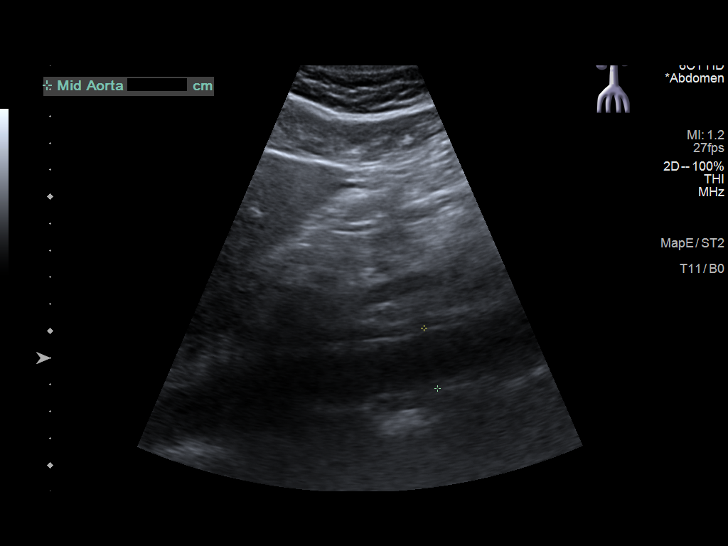
[im 9/21]
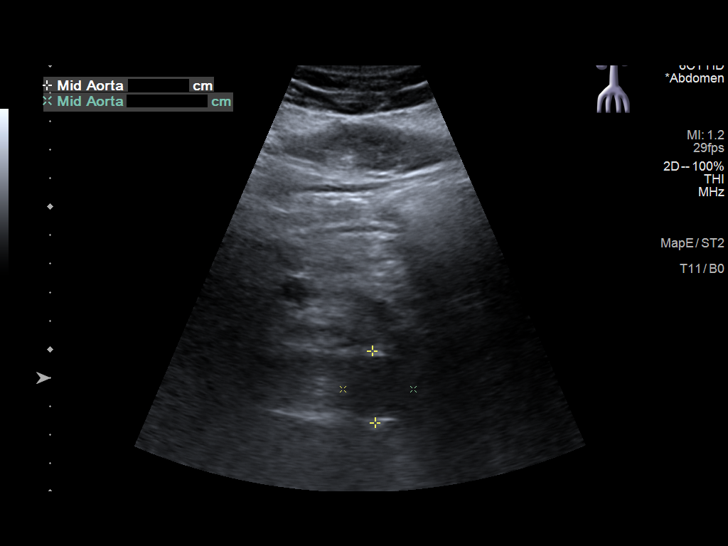
[im 10/21]
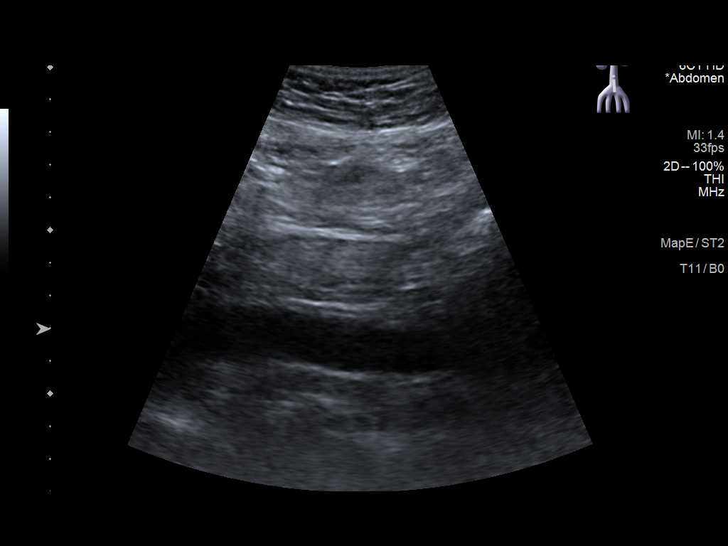
[im 12/21]
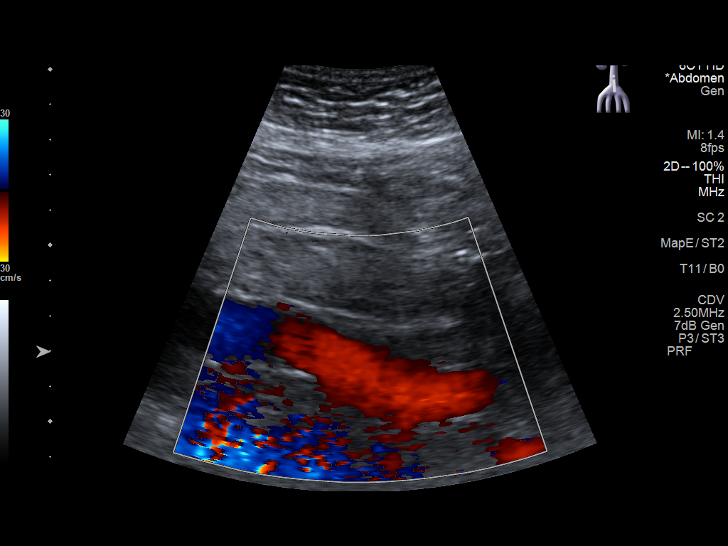
[im 13/21]
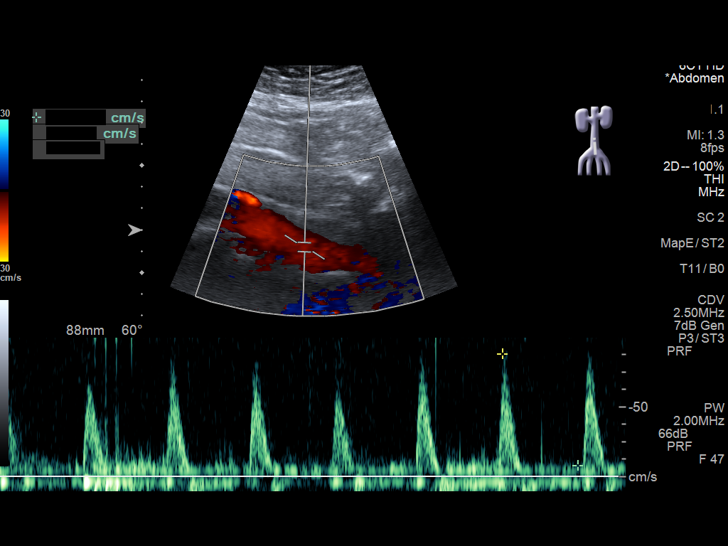
[im 15/21]
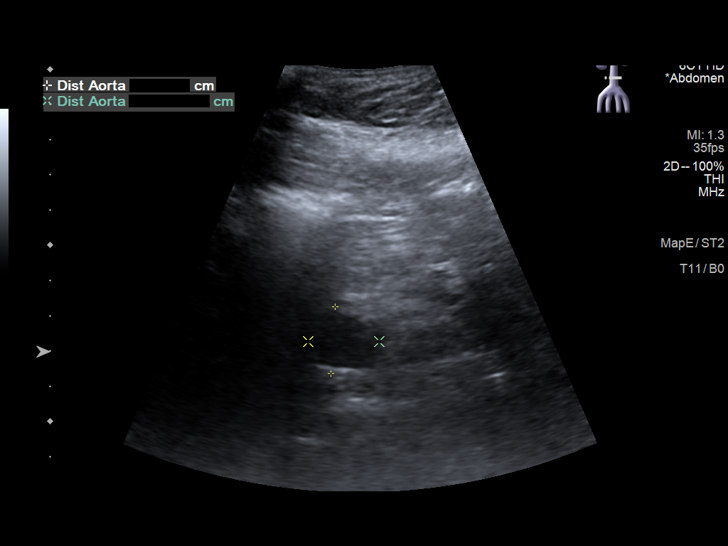
[im 16/21]
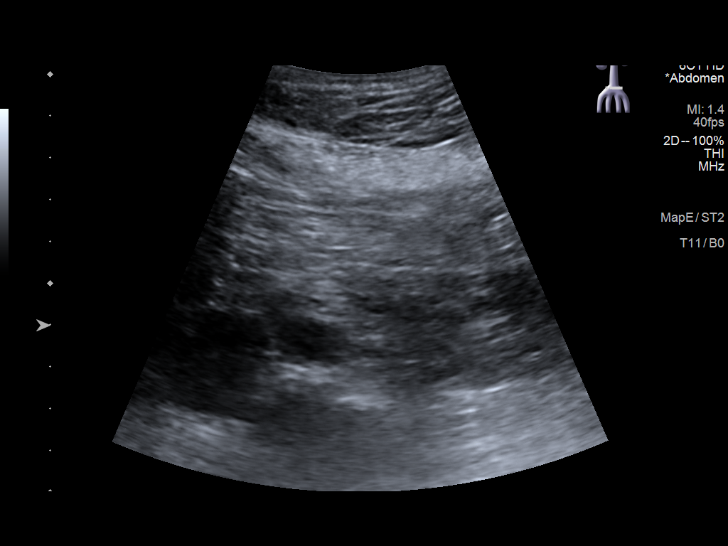
[im 18/21]
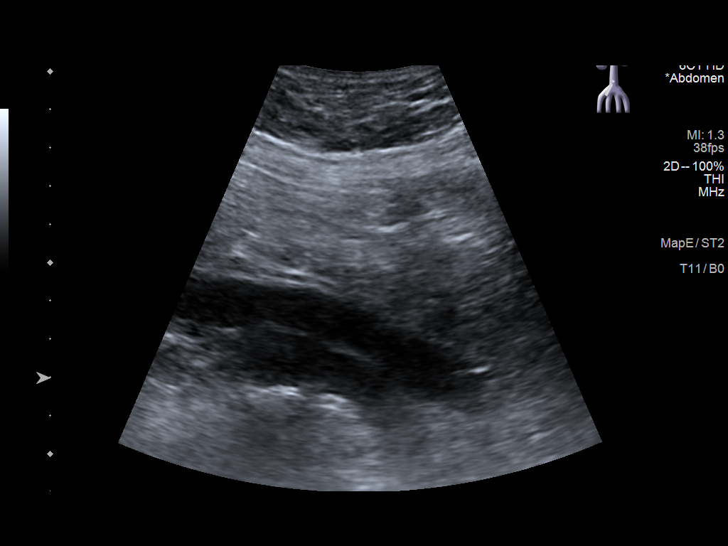
[im 19/21]
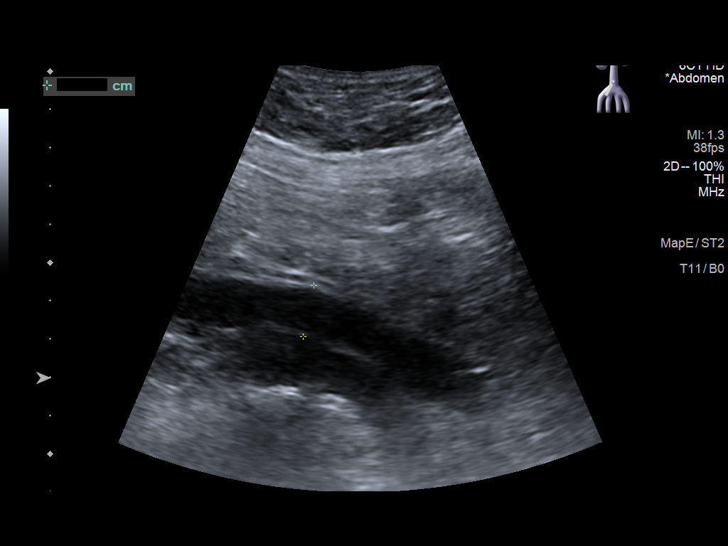
[im 21/21]
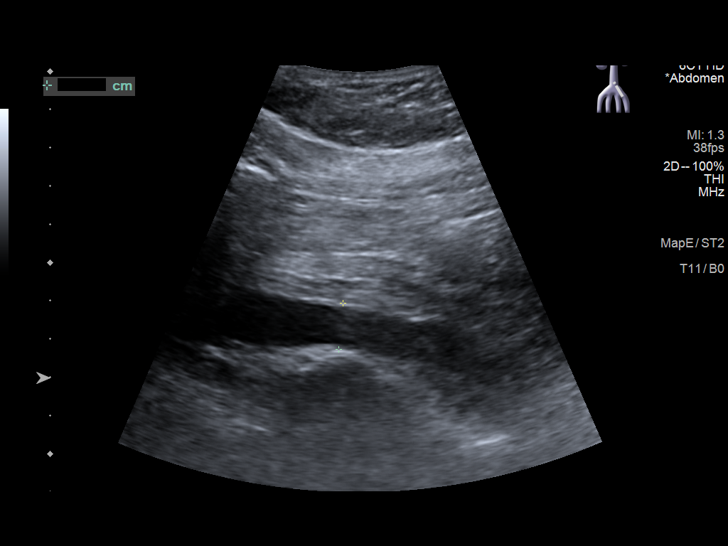

[14 of 21 positions shown; findings below may reference images not displayed]

FINDINGS: Abdominal aortic AP and transverse measurements as follows:

Proximal:  2.7 x 2.4 cm

Mid:  2.5 x 2.5 cm

Distal:  1.9 x 2.0 cm

There is mild scattered nonstenosing calcific aortic plaque.
IMPRESSION: Aortic atherosclerosis without AAA.

## 2022-04-07 DIAGNOSIS — M9903 Segmental and somatic dysfunction of lumbar region: Secondary | ICD-10-CM | POA: Diagnosis not present

## 2022-04-07 DIAGNOSIS — M9902 Segmental and somatic dysfunction of thoracic region: Secondary | ICD-10-CM | POA: Diagnosis not present

## 2022-04-07 DIAGNOSIS — M62830 Muscle spasm of back: Secondary | ICD-10-CM | POA: Diagnosis not present

## 2022-04-07 DIAGNOSIS — M5136 Other intervertebral disc degeneration, lumbar region: Secondary | ICD-10-CM | POA: Diagnosis not present

## 2022-04-12 ENCOUNTER — Ambulatory Visit (INDEPENDENT_AMBULATORY_CARE_PROVIDER_SITE_OTHER): Payer: Medicare Other | Admitting: Family Medicine

## 2022-04-12 ENCOUNTER — Encounter: Payer: Self-pay | Admitting: Family Medicine

## 2022-04-12 DIAGNOSIS — R051 Acute cough: Secondary | ICD-10-CM | POA: Diagnosis not present

## 2022-04-12 MED ORDER — GUAIFENESIN-DM 100-10 MG/5ML PO SYRP: 5.0000 mL | ORAL_SOLUTION | ORAL | 0 refills | Status: DC | PRN

## 2022-04-12 NOTE — Progress Notes (Signed)
    SUBJECTIVE:   CHIEF COMPLAINT / HPI:   UPPER RESPIRATORY TRACT INFECTION - seen previously 8/15 for same. Given cough syrup with improvement.  - Symptoms returned Friday night. - COVID negative   Worst symptom: Fever: subjective Cough: yes, nonproductive Shortness of breath: no Chest pain: no Nasal congestion: yes Runny nose: yes Sore throat: yes Sinus pressure: yes Toothache: no Ear pain: no  Ear pressure: no  Eyes red/itching:no Eye drainage/crusting: no  Vomiting: no Sick contacts: no Recurrent sinusitis: yes Treatments attempted: tylenol, nasal CCS   OBJECTIVE:   BP 134/81 (BP Location: Left Arm, Patient Position: Sitting, Cuff Size: Large)   Pulse 83   Temp 100 F (37.8 C) (Oral)   Resp 16   Wt 205 lb (93 kg)   SpO2 96%   BMI 28.59 kg/m   Gen: well appearing, in NAD HEENT: orophyarynx clear without exudate. Slight erythema in posterior pharynx. Uvula midline. No tonsillar enlargement. Good dentition. TM visible b/l without bulging, erythema, purulence. No cervical or supraclavicular lymphadenopathy. NonTTP over maxillary or frontal sinuses.  Card: RRR Lungs: CTAB Ext: WWP, no edema   ASSESSMENT/PLAN:   VIRAL URI Doing well with mild sx. COVID negative. Reviewed OTC symptom relief (see AVS for details), return and emergency precautions. Rx for cough syrup sent to pharmacy.     Myles Gip, DO

## 2022-04-12 NOTE — Patient Instructions (Signed)
It was great to see you!  Our plans for today:  - Try sudafed or other over the counter decongestant. You can also try Afrin (nasal decongestant spray), don't use more than 3 days.  - We are sending the cough syrup to the pharmacy.   Take care and seek immediate care sooner if you develop any concerns.   Dr. Ky Barban

## 2022-04-14 ENCOUNTER — Ambulatory Visit (INDEPENDENT_AMBULATORY_CARE_PROVIDER_SITE_OTHER): Payer: Medicare Other | Admitting: Dermatology

## 2022-04-14 DIAGNOSIS — L609 Nail disorder, unspecified: Secondary | ICD-10-CM | POA: Diagnosis not present

## 2022-04-14 DIAGNOSIS — L03012 Cellulitis of left finger: Secondary | ICD-10-CM

## 2022-04-14 DIAGNOSIS — L02511 Cutaneous abscess of right hand: Secondary | ICD-10-CM | POA: Diagnosis not present

## 2022-04-14 DIAGNOSIS — L0291 Cutaneous abscess, unspecified: Secondary | ICD-10-CM

## 2022-04-14 MED ORDER — MUPIROCIN 2 % EX OINT: 1.0000 | TOPICAL_OINTMENT | Freq: Two times a day (BID) | CUTANEOUS | 0 refills | Status: DC

## 2022-04-14 MED ORDER — DOXYCYCLINE MONOHYDRATE 100 MG PO CAPS: 100.0000 mg | ORAL_CAPSULE | Freq: Two times a day (BID) | ORAL | 0 refills | Status: AC

## 2022-04-14 NOTE — Progress Notes (Signed)
  Follow-Up Visit   Subjective  Dennis Steele is a 71 y.o. male who presents for the following: Follow-up (Patient reports a sore spot 3rd finger on left hand. ).   The following portions of the chart were reviewed this encounter and updated as appropriate:  Tobacco  Allergies  Meds  Problems  Med Hx  Surg Hx  Fam Hx      Review of Systems: No other skin or systemic complaints except as noted in HPI or Assessment and Plan.   Objective  Well appearing patient in no apparent distress; mood and affect are within normal limits.  A focused examination was performed including right and left fingers. Relevant physical exam findings are noted in the Assessment and Plan.  left dorsal 3rd finger Pus filled bulla on dorsal left 3rd finger with surrounding erythema, tender to touch   right 2nd finger nail Nail dystrophy and onycholysis extending linearly from the proximal nail fold    Assessment & Plan  Paronychia of finger of left hand left dorsal 3rd finger  Start doxycycline 100 mg capsule by mouth twice daily with food and drink.   Doxycycline should be taken with food to prevent nausea. Do not lay down for 30 minutes after taking. Be cautious with sun exposure and use good sun protection while on this medication. Pregnant women should not take this medication.   Start mupirocin 2 % ointment - apply tid to aa qd until healed.      Anaerobic and Aerobic Culture - left dorsal 3rd finger  doxycycline (MONODOX) 100 MG capsule - left dorsal 3rd finger Take 1 capsule (100 mg total) by mouth 2 (two) times daily for 7 days. Take with food and drink. Do not lay down for 30 minutes after taking. Be cautious with sun exposure and use good sun protection while on this medication.  mupirocin ointment (BACTROBAN) 2 % - left dorsal 3rd finger Apply 1 Application topically 2 (two) times daily. Apply topically to wound until healed.  Nail problem right 2nd finger nail  Recommend biopsy  r/o scc   Will schedule with Dr. Nehemiah Massed in the next few weeks    Abscess Right Distal 2nd Finger  Incision and drainage today   Incision and Drainage - Right Distal 2nd Finger Location: 3rd left finger   Informed Consent: Discussed risks (permanent scarring, light or dark discoloration, infection, pain, bleeding, bruising, redness, damage to adjacent structures, and recurrence of the lesion) and benefits of the procedure, as well as the alternatives.  Informed consent was obtained.  Preparation: The area was prepped with alcohol.  Anesthesia: Lidocaine 1% with epinephrine.   Procedure Details: An incision was made overlying the lesion. The lesion drained pus.  A small amount of fluid was drained.    Antibiotic ointment and a sterile pressure dressing were applied. The patient tolerated procedure well.  Total number of lesions drained: 1  Plan: The patient was instructed on post-op care. Recommend OTC analgesia as needed for pain.   Return for Nail biospy with Dr. Nehemiah Massed 1:30 or 2PM Monday or Wednesday preferred if not booked. I, Ruthell Rummage, CMA, am acting as scribe for Forest Gleason, MD.  Documentation: I have reviewed the above documentation for accuracy and completeness, and I agree with the above.  Forest Gleason, MD

## 2022-04-14 NOTE — Patient Instructions (Addendum)
Start doxycycline twice daily for 1 week with food and drink. Doxycycline should be taken with food to prevent nausea. Do not lay down for 30 minutes after taking. Be cautious with sun exposure and use good sun protection while on this medication. Pregnant women should not take this medication.   Start mupirocin ointment apply twice daily with bandage until healed.    Wound Care Instructions  Cleanse wound gently with soap and water once a day then pat dry with clean gauze. Apply a thin coat of Petrolatum (petroleum jelly, "Vaseline") over the wound (unless you have an allergy to this). We recommend that you use a new, sterile tube of Vaseline. Do not pick or remove scabs. Do not remove the yellow or white "healing tissue" from the base of the wound.  Cover the wound with fresh, clean, nonstick gauze and secure with paper tape. You may use Band-Aids in place of gauze and tape if the wound is small enough, but would recommend trimming much of the tape off as there is often too much. Sometimes Band-Aids can irritate the skin.  You should call the office for your biopsy report after 1 week if you have not already been contacted.  If you experience any problems, such as abnormal amounts of bleeding, swelling, significant bruising, significant pain, or evidence of infection, please call the office immediately.  FOR ADULT SURGERY PATIENTS: If you need something for pain relief you may take 1 extra strength Tylenol (acetaminophen) AND 2 Ibuprofen ('200mg'$  each) together every 4 hours as needed for pain. (do not take these if you are allergic to them or if you have a reason you should not take them.) Typically, you may only need pain medication for 1 to 3 days.       Due to recent changes in healthcare laws, you may see results of your pathology and/or laboratory studies on MyChart before the doctors have had a chance to review them. We understand that in some cases there may be results that are  confusing or concerning to you. Please understand that not all results are received at the same time and often the doctors may need to interpret multiple results in order to provide you with the best plan of care or course of treatment. Therefore, we ask that you please give [place] 2 business days to thoroughly review all your results before contacting the office for clarification. Should we see a critical lab result, you will be contacted sooner.   If You Need Anything After Your Visit  If you have any questions or concerns for your doctor, please call our main line at [phone] and press option 4 to reach your doctor's medical assistant. If no one answers, please leave a voicemail as directed and we will return your call as soon as possible. Messages left after 4 pm will be answered the following business day.   You may also send [place] a message via House. We typically respond to MyChart messages within 1-2 business days.  For prescription refills, please ask your pharmacy to contact our office. Our fax number is [phone].  If you have an urgent issue when the clinic is closed that cannot wait until the next business day, you can page your doctor at the number below.    Please note that while we do our best to be available for urgent issues outside of office hours, we are not available 24/7.   If you have an urgent issue and are unable to reach [place],  you may choose to seek medical care at your doctor's office, retail clinic, urgent care center, or emergency room.  If you have a medical emergency, please immediately call 911 or go to the emergency department.  Pager Numbers  - Dr. Nehemiah Massed: [phone]  - Dr. Laurence Ferrari: [phone]  - Dr. Nicole Kindred: [phone]  In the event of inclement weather, please call our main line at [phone] for an update on the status of any delays or closures.  Dermatology Medication Tips: Please keep the boxes that topical medications come in in order to  help keep track of the instructions about where and how to use these. Pharmacies typically print the medication instructions only on the boxes and not directly on the medication tubes.   If your medication is too expensive, please contact our office at [phone] option 4 or send [place] a message through [place].   We are unable to tell what your co-pay for medications will be in advance as this is different depending on your insurance coverage. However, we may be able to find a substitute medication at lower cost or fill out paperwork to get insurance to cover a needed medication.   If a prior authorization is required to get your medication covered by your insurance company, please allow [place] 1-2 business days to complete this process.  Drug prices often vary depending on where the prescription is filled and some pharmacies may offer cheaper prices.  The website www.goodrx.com contains coupons for medications through different pharmacies. The prices here do not account for what the cost may be with help from insurance (it may be cheaper with your insurance), but the website can give you the price if you did not use any insurance.  - You can print the associated coupon and take it with your prescription to the pharmacy.  - You may also stop by our office during regular business hours and pick up a GoodRx coupon card.  - If you need your prescription sent electronically to a different pharmacy, notify our office through [facility] or by phone at [phone] option 4.     Si Usted Necesita Algo Despus de Su Visita  Tambin puede enviarnos un mensaje a travs de Pharmacist, community. Por lo general respondemos a los mensajes de MyChart en el transcurso de 1 a 2 das hbiles.  Para renovar recetas, por favor pida a su farmacia que se ponga en contacto con nuestra oficina. Harland Dingwall de fax es [place] [phone].  Si tiene un asunto urgente cuando la clnica est cerrada y que no puede esperar hasta  el siguiente da hbil, puede llamar/localizar a su doctor(a) al nmero que aparece a continuacin.   Por favor, tenga en cuenta que aunque hacemos todo lo posible para estar disponibles para asuntos urgentes fuera del horario de [place], no estamos disponibles las 24 horas del da, los 7 das de la [place].   Si tiene un problema urgente y no puede comunicarse con nosotros, puede optar por buscar atencin mdica  en el consultorio de su doctor(a), en una clnica privada, en un centro de atencin urgente o en una sala de emergencias.  Si tiene Engineering geologist, por favor llame inmediatamente al 911 o vaya a la sala de emergencias.  Nmeros de bper  - Dr. Nehemiah Massed: [phone]  - Dra. Moye: [phone]  - Dra. Nicole Kindred: [phone]  En caso de inclemencias del [place], por favor llame a Johnsie Kindred principal al [phone] para una actualizacin sobre el estado de cualquier [place] o  cierre.  Consejos para la medicacin en dermatologa: Por favor, guarde las cajas en las que vienen los medicamentos de uso tpico para ayudarle a seguir las instrucciones sobre dnde y cmo usarlos. Las farmacias generalmente imprimen las instrucciones del medicamento slo en las cajas y no directamente en los tubos del [place].   Si su medicamento es muy caro, por favor, pngase en contacto con Zigmund Daniel llamando al [phone] y presione la opcin 4 o envenos un mensaje a travs de Pharmacist, community.   No podemos decirle cul ser su copago por los medicamentos por adelantado ya que esto es diferente dependiendo de la cobertura de su seguro. Sin embargo, es posible que podamos encontrar un medicamento sustituto a Electrical engineer un formulario para que el seguro cubra el medicamento que se considera necesario.   Si se requiere una autorizacin previa para que su compaa de seguros [place] su medicamento, por favor permtanos de 1 a 2 das hbiles para completar este proceso.  Los precios de los  medicamentos varan con frecuencia dependiendo del Environmental consultant de dnde se surte la receta y alguna farmacias pueden ofrecer precios ms baratos.  El sitio web www.goodrx.com tiene cupones para medicamentos de Airline pilot. Los precios aqu no tienen en cuenta lo que podra costar con la ayuda del seguro (puede ser ms barato con su seguro), pero el sitio web puede darle el precio si no utiliz Research scientist (physical sciences).  - Puede imprimir el cupn correspondiente y llevarlo con su receta a la farmacia.  - Tambin puede pasar por nuestra oficina durante el horario de atencin regular y Charity fundraiser una tarjeta de cupones de GoodRx.  - Si necesita que su receta se enve electrnicamente a una farmacia diferente, informe a nuestra oficina a travs de MyChart de [place] o por telfono llamando al [phone] y presione la opcin 4.

## 2022-04-17 ENCOUNTER — Encounter: Payer: Self-pay | Admitting: Dermatology

## 2022-04-19 DIAGNOSIS — M25551 Pain in right hip: Secondary | ICD-10-CM | POA: Diagnosis not present

## 2022-04-19 LAB — ANAEROBIC AND AEROBIC CULTURE

## 2022-04-20 ENCOUNTER — Telehealth: Payer: Self-pay

## 2022-04-20 NOTE — Telephone Encounter (Signed)
-----   Message from Alfonso Patten, MD sent at 04/20/2022  2:23 PM EDT ----- Culture grew S. Aureus, sensitive. To doxycycline. If patient doing well, no additional treatment needed.  MAs please call. Thank you!

## 2022-04-20 NOTE — Telephone Encounter (Signed)
LMOVM to C/B. JP

## 2022-04-21 ENCOUNTER — Telehealth: Payer: Self-pay

## 2022-04-21 NOTE — Telephone Encounter (Signed)
-----   Message from Alfonso Patten, MD sent at 04/20/2022  2:23 PM EDT ----- Culture grew S. Aureus, sensitive. To doxycycline. If patient doing well, no additional treatment needed.  MAs please call. Thank you!

## 2022-04-21 NOTE — Telephone Encounter (Signed)
LMOVM culture results. Finish Doxycycline as directed. If doing well RTC as needed. If not improving or if any questions C/B. JP

## 2022-04-26 ENCOUNTER — Other Ambulatory Visit: Payer: Self-pay | Admitting: Orthopedic Surgery

## 2022-04-26 DIAGNOSIS — Z96641 Presence of right artificial hip joint: Secondary | ICD-10-CM

## 2022-04-28 ENCOUNTER — Other Ambulatory Visit: Payer: Self-pay | Admitting: Orthopedic Surgery

## 2022-04-28 DIAGNOSIS — M545 Low back pain, unspecified: Secondary | ICD-10-CM

## 2022-04-28 DIAGNOSIS — Z96641 Presence of right artificial hip joint: Secondary | ICD-10-CM

## 2022-04-28 DIAGNOSIS — M5416 Radiculopathy, lumbar region: Secondary | ICD-10-CM

## 2022-04-30 ENCOUNTER — Encounter
Admission: RE | Admit: 2022-04-30 | Discharge: 2022-04-30 | Disposition: A | Discharge status: Home / Self Care | Payer: Medicare Other | Source: Ambulatory Visit | Attending: Orthopedic Surgery | Admitting: Orthopedic Surgery

## 2022-04-30 DIAGNOSIS — Z96641 Presence of right artificial hip joint: Secondary | ICD-10-CM | POA: Diagnosis not present

## 2022-04-30 MED ORDER — TECHNETIUM TC 99M MEDRONATE IV KIT
20.0000 | PACK | Freq: Once | INTRAVENOUS | Status: AC | PRN
  Administered 2022-04-30: 22.07 via INTRAVENOUS

## 2022-05-01 ENCOUNTER — Ambulatory Visit
Admission: RE | Admit: 2022-05-01 | Discharge: 2022-05-01 | Disposition: A | Discharge status: Home / Self Care | Payer: Medicare Other | Source: Ambulatory Visit | Attending: Orthopedic Surgery | Admitting: Orthopedic Surgery

## 2022-05-01 DIAGNOSIS — M545 Low back pain, unspecified: Secondary | ICD-10-CM | POA: Diagnosis not present

## 2022-05-01 DIAGNOSIS — M5126 Other intervertebral disc displacement, lumbar region: Secondary | ICD-10-CM | POA: Diagnosis not present

## 2022-05-01 DIAGNOSIS — M5416 Radiculopathy, lumbar region: Secondary | ICD-10-CM | POA: Insufficient documentation

## 2022-05-03 ENCOUNTER — Ambulatory Visit: Payer: Medicare Other | Admitting: Dermatology

## 2022-05-03 DIAGNOSIS — M5416 Radiculopathy, lumbar region: Secondary | ICD-10-CM | POA: Diagnosis not present

## 2022-05-03 DIAGNOSIS — M545 Low back pain, unspecified: Secondary | ICD-10-CM | POA: Diagnosis not present

## 2022-05-05 ENCOUNTER — Ambulatory Visit: Payer: Medicare Other | Admitting: Dermatology

## 2022-05-05 DIAGNOSIS — M9902 Segmental and somatic dysfunction of thoracic region: Secondary | ICD-10-CM | POA: Diagnosis not present

## 2022-05-05 DIAGNOSIS — M5136 Other intervertebral disc degeneration, lumbar region: Secondary | ICD-10-CM | POA: Diagnosis not present

## 2022-05-05 DIAGNOSIS — M5416 Radiculopathy, lumbar region: Secondary | ICD-10-CM | POA: Diagnosis not present

## 2022-05-05 DIAGNOSIS — M62830 Muscle spasm of back: Secondary | ICD-10-CM | POA: Diagnosis not present

## 2022-05-05 DIAGNOSIS — M9903 Segmental and somatic dysfunction of lumbar region: Secondary | ICD-10-CM | POA: Diagnosis not present

## 2022-05-06 DIAGNOSIS — M48062 Spinal stenosis, lumbar region with neurogenic claudication: Secondary | ICD-10-CM | POA: Diagnosis not present

## 2022-05-06 DIAGNOSIS — M5416 Radiculopathy, lumbar region: Secondary | ICD-10-CM | POA: Diagnosis not present

## 2022-05-10 ENCOUNTER — Telehealth: Payer: Self-pay

## 2022-05-10 NOTE — Telephone Encounter (Signed)
Error

## 2022-05-12 NOTE — Progress Notes (Unsigned)
Referring Physician:  No referring provider defined for this encounter.  Primary Physician:  Dennis Steele., MD  History of Present Illness: 05/12/2022 [name] with a chief complaint of low back pain and right leg pain, has since improved with his injections on 05/06/22.  He had an L4-5 fusion approximately 4 years ago and did very well until approximately 1 month ago when he began having pain down his right leg that could be very severe.  His pain was in his right buttock and down his lateral thigh and calf.  It is currently much better after an injection.  Walking and standing make this pain worse.  He was getting pain after walking only a few feet.   Timing: constant Bowel/Bladder Dysfunction: none  Conservative measures:  Physical therapy: has not participated in Multimodal medical therapy including regular antiinflammatories: hydrocodone, oxycodone, mobic, tramadol, prednisone, robaxin, medrol dosepak, gabapentin Injections: has had epidural steroid injections 05/06/22: Right L4-5 ESI 04/15/14: Right L4-5 facet joint injection 01/28/14: Right L45 facet joint injection 11/23/13: Right L4-5 facet joint injection 08/31/13: Right L4-5 facet joint injection  Past Surgery:  L4-5 fusion in 2019  Dennis Steele has no symptoms of cervical myelopathy.  The symptoms are causing a significant impact on the patient's life.   Review of Systems:  A 10 point review of systems is negative, except for the pertinent positives and negatives detailed in the HPI.  Past Medical History: Past Medical History:  Diagnosis Date   Actinic keratosis    Basal cell carcinoma 03/29/2013   Right forearm   Basal cell carcinoma 08/05/2016   Right mid to proximal dorsum forearm   Dysplastic nevus 07/31/2015   Right low back   GERD (gastroesophageal reflux disease)    Hypertension    OAG (open angle glaucoma)    Prostate cancer ([facility])    Squamous cell carcinoma of skin  02/27/2009   Right medial calf    Past Surgical History: Past Surgical History:  Procedure Laterality Date   ANTERIOR CRUCIATE LIGAMENT REPAIR  25 yrs ago   not sure which side   COLONOSCOPY WITH PROPOFOL N/A 10/04/2016   Procedure: COLONOSCOPY WITH PROPOFOL;  Surgeon: Manya Silvas, MD;  Location: [facility];  Service: Endoscopy;  Laterality: N/A;   ESOPHAGOGASTRODUODENOSCOPY (EGD) WITH PROPOFOL N/A 10/04/2016   Procedure: ESOPHAGOGASTRODUODENOSCOPY (EGD) WITH PROPOFOL;  Surgeon: Manya Silvas, MD;  [facility];  Service: Endoscopy;  Laterality: N/A;   EYE SURGERY  04/2015   lens surgery from a dislodged les from cataract surgery   LYMPHADENECTOMY  06/19/2012   Procedure: LYMPHADENECTOMY;  Surgeon: Dutch Gray, MD;  Location: [facility];  Service: Urology;  Laterality: Bilateral;   ROBOT ASSISTED LAPAROSCOPIC RADICAL PROSTATECTOMY  06/19/2012   Procedure: ROBOTIC ASSISTED LAPAROSCOPIC RADICAL PROSTATECTOMY LEVEL 2;  Surgeon: Dutch Gray, MD;  Location: [facility];  Service: Urology;  Laterality: N/A;   TOTAL HIP ARTHROPLASTY Right 2015    Allergies: Allergies as of 05/13/2022   (No Known Allergies)    Medications: No outpatient medications have been marked as taking for the 05/13/22 encounter (Appointment) with Meade Maw, MD.    Social History: Social History   Tobacco Use   Smoking status: Former    Types: Cigars   Smokeless tobacco: Never   Tobacco comments:    quit cig 2 yrs ago, social smoking only  Substance Use Topics   Alcohol use: Yes    Comment: occasional 3 x week  Drug use: No    Family Medical History: Family History  Problem Relation Age of Onset   Diabetes Mother    Cancer Mother        breast   Stroke Mother    Heart disease Mother    Cancer Father        stomach, prostate- pt not complete   Cancer Brother        esophagus    Physical Examination: There were no vitals filed for this visit.  General: Patient is well  developed, well nourished, calm, collected, and in no apparent distress. Attention to examination is appropriate.  Neck:   Supple.  Full range of motion.  Respiratory: Patient is breathing without any difficulty.   NEUROLOGICAL:     Awake, alert, oriented to person, place, and time.  Speech is clear and fluent. Fund of knowledge is appropriate.   Cranial Nerves: Pupils equal round and reactive to light.  Facial tone is symmetric.  Facial sensation is symmetric. Shoulder shrug is symmetric. Tongue protrusion is midline.  There is no pronator drift.  ROM of spine: full.    Strength: Side Biceps Triceps Deltoid Interossei Grip Wrist Ext. Wrist Flex.  R '5 5 5 5 5 5 5  '$ L '5 5 5 5 5 5 5   '$ Side Iliopsoas Quads Hamstring PF DF EHL  R '5 5 5 5 5 5  '$ L '5 5 5 5 5 5   '$ Reflexes are12+ and symmetric at the biceps, triceps, brachioradialis, patella and achilles.   Hoffman's is absent.   Bilateral upper and lower extremity sensation is intact to light touch.    No evidence of dysmetria noted.  Gait is normal.     Medical Decision Making  Imaging: MRI L spine 05/01/22 IMPRESSION: 1. Postoperative changes at L4-L5, with a small fluid collection about the L4 spinous process, evaluation of which is limited by susceptibility artifact from hardware. Persistent moderate to severe spinal canal stenosis at this level with moderate bilateral neural foraminal narrowing. 2. L2-L3 moderate spinal canal stenosis.     Electronically Signed   By: Merilyn Baba M.D.   On: 05/03/2022 13:06  I have personally reviewed the images and agree with the above interpretation.  Assessment and Plan: [name] is a pleasant 71 y.o. male with recurrent lumbar radiculopathy due to recurrent stenosis at L4-5.  He is currently doing somewhat better due to his injection.  I think at this point it is appropriate to refer him for physical therapy and consider the utility of an additional injection depending how  symptoms go.  I would like to see him back in clinic in 6 to 8 weeks.  If he does not get better with conservative management, we will have to obtain a CT scan of the lumbar spine to evaluate whether he is fully fused.  If he is fully fused, he may be a candidate for redo decompression at L4-5.  He also has some compression at L2-3, but I do not feel that this is currently symptomatic.   I spent a total of 30 minutes in face-to-face and non-face-to-face activities related to this patient's care today.  Thank you for involving me in the care of this patient.      Dennis Steele K. Izora Ribas MD, [facility] Neurosurgery

## 2022-05-13 ENCOUNTER — Encounter: Payer: Self-pay | Admitting: Neurosurgery

## 2022-05-13 ENCOUNTER — Ambulatory Visit (INDEPENDENT_AMBULATORY_CARE_PROVIDER_SITE_OTHER): Payer: Medicare Other | Admitting: Neurosurgery

## 2022-05-13 VITALS — BP 124/74 | Ht 71.0 in | Wt 186.4 lb

## 2022-05-13 DIAGNOSIS — M5416 Radiculopathy, lumbar region: Secondary | ICD-10-CM

## 2022-05-18 ENCOUNTER — Ambulatory Visit (INDEPENDENT_AMBULATORY_CARE_PROVIDER_SITE_OTHER): Payer: Medicare Other | Admitting: Dermatology

## 2022-05-18 ENCOUNTER — Encounter: Payer: Self-pay | Admitting: Dermatology

## 2022-05-18 DIAGNOSIS — L719 Rosacea, unspecified: Secondary | ICD-10-CM

## 2022-05-18 DIAGNOSIS — L570 Actinic keratosis: Secondary | ICD-10-CM | POA: Diagnosis not present

## 2022-05-18 NOTE — Patient Instructions (Addendum)
Cryotherapy Aftercare  Wash gently with soap and water everyday.   Apply Vaseline daily until healed.    Start Epsolay cream with Zilxi foam once or twice daily to affected areas on face.   Due to recent changes in healthcare laws, you may see results of your pathology and/or laboratory studies on MyChart before the doctors have had a chance to review them. We understand that in some cases there may be results that are confusing or concerning to you. Please understand that not all results are received at the same time and often the doctors may need to interpret multiple results in order to provide you with the best plan of care or course of treatment. Therefore, we ask that you please give [place] 2 business days to thoroughly review all your results before contacting the office for clarification. Should we see a critical lab result, you will be contacted sooner.   If You Need Anything After Your Visit  If you have any questions or concerns for your doctor, please call our main line at [phone] and press option 4 to reach your doctor's medical assistant. If no one answers, please leave a voicemail as directed and we will return your call as soon as possible. Messages left after 4 pm will be answered the following business day.   You may also send [place] a message via [place]. We typically respond to MyChart messages within 1-2 business days.  For prescription refills, please ask your pharmacy to contact our office. Our fax number is [phone].  If you have an urgent issue when the clinic is closed that cannot wait until the next business day, you can page your doctor at the number below.    Please note that while we do our best to be available for urgent issues outside of office hours, we are not available 24/7.   If you have an urgent issue and are unable to reach [place], you may choose to seek medical care at your doctor's office, retail clinic, urgent care center, or emergency room.  If you  have a medical emergency, please immediately call 911 or go to the emergency department.  Pager Numbers  - Dr. Nehemiah Massed: [phone]  - Dr. Laurence Ferrari: [phone]  - Dr. Nicole Kindred: [phone]  In the event of inclement weather, please call our main line at [phone] for an update on the status of any delays or closures.  Dermatology Medication Tips: Please keep the boxes that topical medications come in in order to help keep track of the instructions about where and how to use these. Pharmacies typically print the medication instructions only on the boxes and not directly on the medication tubes.   If your medication is too expensive, please contact our office at [phone] option 4 or send [place] a message through [place].   We are unable to tell what your co-pay for medications will be in advance as this is different depending on your insurance coverage. However, we may be able to find a substitute medication at lower cost or fill out paperwork to get insurance to cover a needed medication.   If a prior authorization is required to get your medication covered by your insurance company, please allow [place] 1-2 business days to complete this process.  Drug prices often vary depending on where the prescription is filled and some pharmacies may offer cheaper prices.  The website www.goodrx.com contains coupons for medications through different pharmacies. The prices here do not account for what the cost may be with  help from insurance (it may be cheaper with your insurance), but the website can give you the price if you did not use any insurance.  - You can print the associated coupon and take it with your prescription to the pharmacy.  - You may also stop by our office during regular business hours and pick up a GoodRx coupon card.  - If you need your prescription sent electronically to a different pharmacy, notify our office through [facility] or by phone at [phone] option  4.     Si Usted Necesita Algo Despus de Su Visita  Tambin puede enviarnos un mensaje a travs de Pharmacist, community. Por lo general respondemos a los mensajes de MyChart en el transcurso de 1 a 2 das hbiles.  Para renovar recetas, por favor pida a su farmacia que se ponga en contacto con nuestra oficina. Harland Dingwall de fax es [place] [phone].  Si tiene un asunto urgente cuando la clnica est cerrada y que no puede esperar hasta el siguiente da hbil, puede llamar/localizar a su doctor(a) al nmero que aparece a continuacin.   Por favor, tenga en cuenta que aunque hacemos todo lo posible para estar disponibles para asuntos urgentes fuera del horario de [place], no estamos disponibles las 24 horas del da, los 7 das de la [place].   Si tiene un problema urgente y no puede comunicarse con nosotros, puede optar por buscar atencin mdica  en el consultorio de su doctor(a), en una clnica privada, en un centro de atencin urgente o en una sala de emergencias.  Si tiene Engineering geologist, por favor llame inmediatamente al 911 o vaya a la sala de emergencias.  Nmeros de bper  - Dr. Nehemiah Massed: [phone]  - Dra. Moye: [phone]  - Dra. Nicole Kindred: [phone]  En caso de inclemencias del [place], por favor llame a Johnsie Kindred principal al [phone] para una actualizacin sobre el [place] de cualquier retraso o cierre.  Consejos para la medicacin en dermatologa: Por favor, guarde las cajas en las que vienen los medicamentos de uso tpico para ayudarle a seguir las instrucciones sobre dnde y cmo usarlos. Las farmacias generalmente imprimen las instrucciones del medicamento slo en las cajas y no directamente en los tubos del [place].   Si su medicamento es muy caro, por favor, pngase en contacto con Zigmund Daniel llamando al [phone] y presione la opcin 4 o envenos un mensaje a travs de Pharmacist, community.   No podemos decirle cul ser su copago por los medicamentos por  adelantado ya que esto es diferente dependiendo de la cobertura de su seguro. Sin embargo, es posible que podamos encontrar un medicamento sustituto a Electrical engineer un formulario para que el seguro cubra el medicamento que se considera necesario.   Si se requiere una autorizacin previa para que su compaa de seguros [place] su medicamento, por favor permtanos de 1 a 2 das hbiles para completar este proceso.  Los precios de los medicamentos varan con frecuencia dependiendo del Environmental consultant de dnde se surte la receta y alguna farmacias pueden ofrecer precios ms baratos.  El sitio web www.goodrx.com tiene cupones para medicamentos de Airline pilot. Los precios aqu no tienen en cuenta lo que podra costar con la ayuda del seguro (puede ser ms barato con su seguro), pero el sitio web puede darle el precio si no utiliz Research scientist (physical sciences).  - Puede imprimir el cupn correspondiente y llevarlo con su receta a la farmacia.  - Tambin puede pasar por nuestra oficina durante el  horario de Freight forwarder regular y Charity fundraiser una tarjeta de cupones de GoodRx.  - Si necesita que su receta se enve electrnicamente a una farmacia diferente, informe a nuestra oficina a travs de MyChart de [place] o por telfono llamando al [phone] y presione la opcin 4.

## 2022-05-18 NOTE — Progress Notes (Signed)
   Follow-Up Visit   Subjective  Dennis Steele is a 71 y.o. male who presents for the following: lesions (Check 2 spots on face: nose and right cheek. Area on nose looked like a pimple but will not pop. Area on right cheek is flaky ).  The patient has spots, moles and lesions to be evaluated, some may be new or changing and the patient has concerns that these could be cancer.   The following portions of the chart were reviewed this encounter and updated as appropriate:  Tobacco  Allergies  Meds  Problems  Med Hx  Surg Hx  Fam Hx      Review of Systems: No other skin or systemic complaints except as noted in HPI or Assessment and Plan.   Objective  Well appearing patient in no apparent distress; mood and affect are within normal limits.  A focused examination was performed including face. Relevant physical exam findings are noted in the Assessment and Plan.  Left Tip of Nose Erythematous papule  Right Medial Cheek x1, left bridge of nose x1 (2) Erythematous thin papules/macules with gritty scale.    Assessment & Plan  Rosacea Left Tip of Nose  C/w acne/rosacea pimple  Rosacea is a chronic progressive skin condition usually affecting the face of adults, causing redness and/or acne bumps. It is treatable but not curable. It sometimes affects the eyes (ocular rosacea) as well. It may respond to topical and/or systemic medication and can flare with stress, sun exposure, alcohol, exercise, topical steroids (including hydrocortisone/cortisone 10) and some foods.  Daily application of broad spectrum spf 30+ sunscreen to face is recommended to reduce flares.  He defers maintenance treatment.  Start Epsolay cream with Zilxi foam once or twice daily to affected areas on face. Sample of each given today.   AK (actinic keratosis) (2) Right Medial Cheek x1, left bridge of nose x1  Actinic keratoses are precancerous spots that appear secondary to cumulative UV radiation exposure/sun  exposure over time. They are chronic with expected duration over 1 year. A portion of actinic keratoses will progress to squamous cell carcinoma of the skin. It is not possible to reliably predict which spots will progress to skin cancer and so treatment is recommended to prevent development of skin cancer.  Recommend daily broad spectrum sunscreen SPF 30+ to sun-exposed areas, reapply every 2 hours as needed.  Recommend staying in the shade or wearing long sleeves, sun glasses (UVA+UVB protection) and wide brim hats (4-inch brim around the entire circumference of the hat). Call for new or changing lesions.  Destruction of lesion - Right Medial Cheek x1, left bridge of nose x1  Destruction method: cryotherapy   Informed consent: discussed and consent obtained   Lesion destroyed using liquid nitrogen: Yes   Region frozen until ice ball extended beyond lesion: Yes   Outcome: patient tolerated procedure well with no complications   Post-procedure details: wound care instructions given   Additional details:  Prior to procedure, discussed risks of blister formation, small wound, skin dyspigmentation, or rare scar following cryotherapy. Recommend Vaseline ointment to treated areas while healing.    Return for Follow Up As Scheduled.   I, Emelia Salisbury, CMA, am acting as scribe for Forest Gleason, MD.  Documentation: I have reviewed the above documentation for accuracy and completeness, and I agree with the above.  Forest Gleason, MD

## 2022-05-20 ENCOUNTER — Encounter: Payer: Self-pay | Admitting: Dermatology

## 2022-06-02 DIAGNOSIS — M62830 Muscle spasm of back: Secondary | ICD-10-CM | POA: Diagnosis not present

## 2022-06-02 DIAGNOSIS — M9903 Segmental and somatic dysfunction of lumbar region: Secondary | ICD-10-CM | POA: Diagnosis not present

## 2022-06-02 DIAGNOSIS — M9902 Segmental and somatic dysfunction of thoracic region: Secondary | ICD-10-CM | POA: Diagnosis not present

## 2022-06-02 DIAGNOSIS — M5136 Other intervertebral disc degeneration, lumbar region: Secondary | ICD-10-CM | POA: Diagnosis not present

## 2022-06-09 DIAGNOSIS — M5459 Other low back pain: Secondary | ICD-10-CM | POA: Diagnosis not present

## 2022-06-09 DIAGNOSIS — M5416 Radiculopathy, lumbar region: Secondary | ICD-10-CM | POA: Diagnosis not present

## 2022-06-15 ENCOUNTER — Ambulatory Visit (INDEPENDENT_AMBULATORY_CARE_PROVIDER_SITE_OTHER): Payer: Medicare Other | Admitting: Dermatology

## 2022-06-15 DIAGNOSIS — D1801 Hemangioma of skin and subcutaneous tissue: Secondary | ICD-10-CM | POA: Diagnosis not present

## 2022-06-15 DIAGNOSIS — D485 Neoplasm of uncertain behavior of skin: Secondary | ICD-10-CM

## 2022-06-15 DIAGNOSIS — L601 Onycholysis: Secondary | ICD-10-CM

## 2022-06-15 DIAGNOSIS — L603 Nail dystrophy: Secondary | ICD-10-CM

## 2022-06-15 DIAGNOSIS — B078 Other viral warts: Secondary | ICD-10-CM | POA: Diagnosis not present

## 2022-06-15 NOTE — Patient Instructions (Signed)
Wound Care Instructions  Cleanse wound gently with soap and water once a day then pat dry with clean gauze. Apply a thin coat of Petrolatum (petroleum jelly, "Vaseline") over the wound (unless you have an allergy to this). We recommend that you use a new, sterile tube of Vaseline. Do not pick or remove scabs. Do not remove the yellow or white "healing tissue" from the base of the wound.  Cover the wound with fresh, clean, nonstick gauze and secure with paper tape. You may use Band-Aids in place of gauze and tape if the wound is small enough, but would recommend trimming much of the tape off as there is often too much. Sometimes Band-Aids can irritate the skin.  You should call the office for your biopsy report after 1 week if you have not already been contacted.  If you experience any problems, such as abnormal amounts of bleeding, swelling, significant bruising, significant pain, or evidence of infection, please call the office immediately.  FOR ADULT SURGERY PATIENTS: If you need something for pain relief you may take 1 extra strength Tylenol (acetaminophen) AND 2 Ibuprofen (200mg each) together every 4 hours as needed for pain. (do not take these if you are allergic to them or if you have a reason you should not take them.) Typically, you may only need pain medication for 1 to 3 days.     Due to recent changes in healthcare laws, you may see results of your pathology and/or laboratory studies on MyChart before the doctors have had a chance to review them. We understand that in some cases there may be results that are confusing or concerning to you. Please understand that not all results are received at the same time and often the doctors may need to interpret multiple results in order to provide you with the best plan of care or course of treatment. Therefore, we ask that you please give us 2 business days to thoroughly review all your results before contacting the office for clarification. Should  we see a critical lab result, you will be contacted sooner.   If You Need Anything After Your Visit  If you have any questions or concerns for your doctor, please call our main line at [phone] and press option 4 to reach your doctor's medical assistant. If no one answers, please leave a voicemail as directed and we will return your call as soon as possible. Messages left after 4 pm will be answered the following business day.   You may also send us a message via MyChart. We typically respond to MyChart messages within 1-2 business days.  For prescription refills, please ask your pharmacy to contact our office. Our fax number is [phone].  If you have an urgent issue when the clinic is closed that cannot wait until the next business day, you can page your doctor at the number below.    Please note that while we do our best to be available for urgent issues outside of office hours, we are not available 24/7.   If you have an urgent issue and are unable to reach us, you may choose to seek medical care at your doctor's office, retail clinic, urgent care center, or emergency room.  If you have a medical emergency, please immediately call 911 or go to the emergency department.  Pager Numbers  - Dr. Kowalski: [phone]  - Dr. Moye: [phone]  - Dr. Stewart: [phone]  In the event of inclement weather, please call our main line at   [phone] for an update on the status of any delays or closures.  Dermatology Medication Tips: Please keep the boxes that topical medications come in in order to help keep track of the instructions about where and how to use these. Pharmacies typically print the medication instructions only on the boxes and not directly on the medication tubes.   If your medication is too expensive, please contact our office at [phone] option 4 or send us a message through MyChart.   We are unable to tell what your co-pay for medications will be in  advance as this is different depending on your insurance coverage. However, we may be able to find a substitute medication at lower cost or fill out paperwork to get insurance to cover a needed medication.   If a prior authorization is required to get your medication covered by your insurance company, please allow us 1-2 business days to complete this process.  Drug prices often vary depending on where the prescription is filled and some pharmacies may offer cheaper prices.  The website www.goodrx.com contains coupons for medications through different pharmacies. The prices here do not account for what the cost may be with help from insurance (it may be cheaper with your insurance), but the website can give you the price if you did not use any insurance.  - You can print the associated coupon and take it with your prescription to the pharmacy.  - You may also stop by our office during regular business hours and pick up a GoodRx coupon card.  - If you need your prescription sent electronically to a different pharmacy, notify our office through [place] MyChart or by phone at [phone] option 4.     Si Usted Necesita Algo Despus de Su Visita  Tambin puede enviarnos un mensaje a travs de MyChart. Por lo general respondemos a los mensajes de MyChart en el transcurso de 1 a 2 das hbiles.  Para renovar recetas, por favor pida a su farmacia que se ponga en contacto con nuestra oficina. Nuestro nmero de fax es el [phone].  Si tiene un asunto urgente cuando la clnica est cerrada y que no puede esperar hasta el siguiente da hbil, puede llamar/localizar a su doctor(a) al nmero que aparece a continuacin.   Por favor, tenga en cuenta que aunque hacemos todo lo posible para estar disponibles para asuntos urgentes fuera del horario de oficina, no estamos disponibles las 24 horas del da, los 7 das de la semana.   Si tiene un problema urgente y no puede comunicarse con nosotros, puede  optar por buscar atencin mdica  en el consultorio de su doctor(a), en una clnica privada, en un centro de atencin urgente o en una sala de emergencias.  Si tiene una emergencia mdica, por favor llame inmediatamente al 911 o vaya a la sala de emergencias.  Nmeros de bper  - Dr. Kowalski: [phone]  - Dra. Moye: [phone]  - Dra. Stewart: [phone]  En caso de inclemencias del tiempo, por favor llame a nuestra lnea principal al [phone] para una actualizacin sobre el [place] de cualquier retraso o cierre.  Consejos para la medicacin en dermatologa: Por favor, guarde las cajas en las que vienen los medicamentos de uso tpico para ayudarle a seguir las instrucciones sobre dnde y cmo usarlos. Las farmacias generalmente imprimen las instrucciones del medicamento slo en las cajas y no directamente en los tubos del medicamento.   Si su medicamento es muy caro, por favor, pngase en contacto con   nuestra oficina llamando al [phone] y presione la opcin 4 o envenos un mensaje a travs de MyChart.   No podemos decirle cul ser su copago por los medicamentos por adelantado ya que esto es diferente dependiendo de la cobertura de su seguro. Sin embargo, es posible que podamos encontrar un medicamento sustituto a menor costo o llenar un formulario para que el seguro cubra el medicamento que se considera necesario.   Si se requiere una autorizacin previa para que su compaa de seguros cubra su medicamento, por favor permtanos de 1 a 2 das hbiles para completar este proceso.  Los precios de los medicamentos varan con frecuencia dependiendo del lugar de dnde se surte la receta y alguna farmacias pueden ofrecer precios ms baratos.  El sitio web www.goodrx.com tiene cupones para medicamentos de diferentes farmacias. Los precios aqu no tienen en cuenta lo que podra costar con la ayuda del seguro (puede ser ms barato con su seguro), pero el sitio web puede darle el  precio si no utiliz ningn seguro.  - Puede imprimir el cupn correspondiente y llevarlo con su receta a la farmacia.  - Tambin puede pasar por nuestra oficina durante el horario de atencin regular y recoger una tarjeta de cupones de GoodRx.  - Si necesita que su receta se enve electrnicamente a una farmacia diferente, informe a nuestra oficina a travs de MyChart de [place] o por telfono llamando al [phone] y presione la opcin 4.  

## 2022-06-15 NOTE — Progress Notes (Signed)
Follow-Up Visit   Subjective  Dennis Steele is a 71 y.o. male who presents for the following: R/O SCC (R 2nd fingernail, nail bx today).  The following portions of the chart were reviewed this encounter and updated as appropriate:   Tobacco  Allergies  Meds  Problems  Med Hx  Surg Hx  Fam Hx     Review of Systems:  No other skin or systemic complaints except as noted in HPI or Assessment and Plan.  Objective  Well appearing patient in no apparent distress; mood and affect are within normal limits.  A focused examination was performed including right index finger. Relevant physical exam findings are noted in the Assessment and Plan.  R 2nd fingernail proximal Nail dystrophy and onycholysis extending linearly from the proximal nail fold  R 2nd fingernail distal Nail dystrophy and onycholysis extending linearly from the proximal nail fold  R 2nd fingernail Nail dystrophy and onycholysis extending linearly from the proximal nail fold   Assessment & Plan  Neoplasm of uncertain behavior of skin (3) R 2nd finger, nail bed -  proximal  Skin / nail biopsy Type of biopsy: tangential   Informed consent: discussed and consent obtained   Timeout: patient name, date of birth, surgical site, and procedure verified   Procedure prep:  Patient was prepped and draped in usual sterile fashion Prep type:  Isopropyl alcohol Anesthesia: the lesion was anesthetized in a standard fashion   Anesthetic:  1% lidocaine w/ epinephrine 1-100,000 buffered w/ 8.4% NaHCO3 Instrument used: flexible razor blade  ; #15 blade scalpel Hemostasis achieved with: pressure, aluminum chloride and electrodesiccation   Outcome: patient tolerated procedure well   Post-procedure details: sterile dressing applied and wound care instructions given   Dressing type: bandage and petrolatum    Specimen 1 - Surgical pathology Differential Diagnosis: R/O SCC vs other Check Margins: No Nail dystrophy and onycholysis  extending linearly from the proximal nail fold  R 2nd finger nail bed -  distal  Skin / nail biopsy Type of biopsy: tangential   Informed consent: discussed and consent obtained   Timeout: patient name, date of birth, surgical site, and procedure verified   Procedure prep:  Patient was prepped and draped in usual sterile fashion Prep type:  Isopropyl alcohol Anesthesia: the lesion was anesthetized in a standard fashion   Anesthetic:  1% lidocaine w/ epinephrine 1-100,000 buffered w/ 8.4% NaHCO3 Instrument used: flexible razor blade  ; #15 blade scalpel Hemostasis achieved with: pressure, aluminum chloride and electrodesiccation   Outcome: patient tolerated procedure well   Post-procedure details: sterile dressing applied and wound care instructions given   Dressing type: bandage and petrolatum    Specimen 2 - Surgical pathology Differential Diagnosis: R/O SCC vs other Check Margins: No Nail dystrophy and onycholysis extending linearly from the proximal nail fold   R 2nd fingernail  Skin / nail biopsy Type of biopsy: Nail avulsion   Informed consent: discussed and consent obtained   Timeout: patient name, date of birth, surgical site, and procedure verified   Procedure prep:  Patient was prepped and draped in usual sterile fashion Prep type:  Isopropyl alcohol Anesthesia: the lesion was anesthetized in a standard fashion   Anesthetic:  1% lidocaine w/ epinephrine 1-100,000 buffered w/ 8.4% NaHCO3 Instrument used: Nail surgery instruments Hemostasis achieved with: pressure, aluminum chloride and electrodesiccation   Outcome: patient tolerated procedure well   Post-procedure details: sterile dressing applied and wound care instructions given   Dressing type: bandage  and petrolatum    Specimen 3 - Surgical pathology Differential Diagnosis: R/O SCC vs other Check Margins: No Nail dystrophy and onycholysis extending linearly from the proximal nail fold  Return for Follow up as  scheduled.  I, Ashok Cordia, CMA, am acting as scribe for Sarina Ser, MD . Documentation: I have reviewed the above documentation for accuracy and completeness, and I agree with the above.  Sarina Ser, MD

## 2022-06-16 ENCOUNTER — Ambulatory Visit (INDEPENDENT_AMBULATORY_CARE_PROVIDER_SITE_OTHER): Payer: Medicare Other | Admitting: Dermatology

## 2022-06-16 DIAGNOSIS — R58 Hemorrhage, not elsewhere classified: Secondary | ICD-10-CM

## 2022-06-16 NOTE — Patient Instructions (Addendum)
Can leave bandage on for a few days if it doesn't soak through and cover with a baggy while showering to keep dry. Recommend cleansing with warm water and Hibiclens then covering with Mupirocin 2% ointment or Vaseline ointment and a bandage daily thereafter.  Due to recent changes in healthcare laws, you may see results of your pathology and/or laboratory studies on MyChart before the doctors have had a chance to review them. We understand that in some cases there may be results that are confusing or concerning to you. Please understand that not all results are received at the same time and often the doctors may need to interpret multiple results in order to provide you with the best plan of care or course of treatment. Therefore, we ask that you please give [place] 2 business days to thoroughly review all your results before contacting the office for clarification. Should we see a critical lab result, you will be contacted sooner.   If You Need Anything After Your Visit  If you have any questions or concerns for your doctor, please call our main line at [phone] and press option 4 to reach your doctor's medical assistant. If no one answers, please leave a voicemail as directed and we will return your call as soon as possible. Messages left after 4 pm will be answered the following business day.   You may also send [place] a message via [place]. We typically respond to MyChart messages within 1-2 business days.  For prescription refills, please ask your pharmacy to contact our office. Our fax number is [phone].  If you have an urgent issue when the clinic is closed that cannot wait until the next business day, you can page your doctor at the number below.    Please note that while we do our best to be available for urgent issues outside of office hours, we are not available 24/7.   If you have an urgent issue and are unable to reach [place], you may choose to seek medical care at your doctor's office,  retail clinic, urgent care center, or emergency room.  If you have a medical emergency, please immediately call 911 or go to the emergency department.  Pager Numbers  - Dr. Nehemiah Massed: [phone]  - Dr. Laurence Ferrari: [phone]  - Dr. Nicole Kindred: [phone]  In the event of inclement weather, please call our main line at [phone] for an update on the status of any delays or closures.  Dermatology Medication Tips: Please keep the boxes that topical medications come in in order to help keep track of the instructions about where and how to use these. Pharmacies typically print the medication instructions only on the boxes and not directly on the medication tubes.   If your medication is too expensive, please contact our office at [phone] option 4 or send [place] a message through [place].   We are unable to tell what your co-pay for medications will be in advance as this is different depending on your insurance coverage. However, we may be able to find a substitute medication at lower cost or fill out paperwork to get insurance to cover a needed medication.   If a prior authorization is required to get your medication covered by your insurance company, please allow [place] 1-2 business days to complete this process.  Drug prices often vary depending on where the prescription is filled and some pharmacies may offer cheaper prices.  The website www.goodrx.com contains coupons for medications through different pharmacies. The prices here do not  account for what the cost may be with help from insurance (it may be cheaper with your insurance), but the website can give you the price if you did not use any insurance.  - You can print the associated coupon and take it with your prescription to the pharmacy.  - You may also stop by our office during regular business hours and pick up a GoodRx coupon card.  - If you need your prescription sent electronically to a different pharmacy, notify our office through  [facility] or by phone at [phone] option 4.     Si Usted Necesita Algo Despus de Su Visita  Tambin puede enviarnos un mensaje a travs de Pharmacist, community. Por lo general respondemos a los mensajes de MyChart en el transcurso de 1 a 2 das hbiles.  Para renovar recetas, por favor pida a su farmacia que se ponga en contacto con nuestra oficina. Harland Dingwall de fax es [place] [phone].  Si tiene un asunto urgente cuando la clnica est cerrada y que no puede esperar hasta el siguiente da hbil, puede llamar/localizar a su doctor(a) al nmero que aparece a continuacin.   Por favor, tenga en cuenta que aunque hacemos todo lo posible para estar disponibles para asuntos urgentes fuera del horario de [place], no estamos disponibles las 24 horas del da, los 7 das de la [place].   Si tiene un problema urgente y no puede comunicarse con nosotros, puede optar por buscar atencin mdica  en el consultorio de su doctor(a), en una clnica privada, en un centro de atencin urgente o en una sala de emergencias.  Si tiene Engineering geologist, por favor llame inmediatamente al 911 o vaya a la sala de emergencias.  Nmeros de bper  - Dr. Nehemiah Massed: [phone]  - Dra. Moye: [phone]  - Dra. Nicole Kindred: [phone]  En caso de inclemencias del [place], por favor llame a Johnsie Kindred principal al [phone] para una actualizacin sobre el [place] de cualquier retraso o cierre.  Consejos para la medicacin en dermatologa: Por favor, guarde las cajas en las que vienen los medicamentos de uso tpico para ayudarle a seguir las instrucciones sobre dnde y cmo usarlos. Las farmacias generalmente imprimen las instrucciones del medicamento slo en las cajas y no directamente en los tubos del [place].   Si su medicamento es muy caro, por favor, pngase en contacto con Zigmund Daniel llamando al [phone] y presione la opcin 4 o envenos un mensaje a travs de Pharmacist, community.   No podemos  decirle cul ser su copago por los medicamentos por adelantado ya que esto es diferente dependiendo de la cobertura de su seguro. Sin embargo, es posible que podamos encontrar un medicamento sustituto a Electrical engineer un formulario para que el seguro cubra el medicamento que se considera necesario.   Si se requiere una autorizacin previa para que su compaa de seguros [place] su medicamento, por favor permtanos de 1 a 2 das hbiles para completar este proceso.  Los precios de los medicamentos varan con frecuencia dependiendo del Environmental consultant de dnde se surte la receta y alguna farmacias pueden ofrecer precios ms baratos.  El sitio web www.goodrx.com tiene cupones para medicamentos de Airline pilot. Los precios aqu no tienen en cuenta lo que podra costar con la ayuda del seguro (puede ser ms barato con su seguro), pero el sitio web puede darle el precio si no utiliz Research scientist (physical sciences).  - Puede imprimir el cupn correspondiente y llevarlo con su receta a la farmacia.  -  Tambin puede pasar por nuestra oficina durante el horario de atencin regular y Charity fundraiser una tarjeta de cupones de GoodRx.  - Si necesita que su receta se enve electrnicamente a una farmacia diferente, informe a nuestra oficina a travs de MyChart de [place] o por telfono llamando al [phone] y presione la opcin 4.

## 2022-06-16 NOTE — Progress Notes (Signed)
   Follow-Up Visit   Subjective  Dennis Steele is a 71 y.o. male who presents for the following: Excessive bleeding  (At nail removal site - patient states that blood has been soaking through bandage since 3:30 pm yesterday. His wife helped bandage it and wrapped a plastic baggy around it so he wouldn't get blood everywhere).  The following portions of the chart were reviewed this encounter and updated as appropriate:   Tobacco  Allergies  Meds  Problems  Med Hx  Surg Hx  Fam Hx      Review of Systems:  No other skin or systemic complaints except as noted in HPI or Assessment and Plan.  Objective  Well appearing patient in no apparent distress; mood and affect are within normal limits.  A focused examination was performed including the face, hands, fingers. Relevant physical exam findings are noted in the Assessment and Plan.  R 2nd fingernail Bleeding nail bed    Assessment & Plan  Bleeding R 2nd fingernail  At site of nail removal/biopsy -   Area cleansed with Puracyn spray, Stop Bleed applied to aa then covered with Surgifoam and covered with a non-stick gauze and wrapped in [place]. Can leave bandage on for 24 hours if it doesn't soak through and cover with a plastic bag while showering to keep dry. At dressing change, recommend cleansing with warm water and Hibiclens then covering with Mupirocin 2% ointment or Vaseline ointment and a bandage daily thereafter.    Return for appointment as scheduled.  Luther Redo, CMA, am acting as scribe for Forest Gleason, MD .  Documentation: I have reviewed the above documentation for accuracy and completeness, and I agree with the above.  Forest Gleason, MD

## 2022-06-22 ENCOUNTER — Encounter: Payer: Self-pay | Admitting: Dermatology

## 2022-06-28 ENCOUNTER — Other Ambulatory Visit: Payer: Self-pay | Admitting: Family Medicine

## 2022-06-28 ENCOUNTER — Telehealth: Payer: Self-pay

## 2022-06-28 NOTE — Telephone Encounter (Signed)
Medication Refill - Medication: sildenafil (VIAGRA) 100 MG tablet   Has the patient contacted their pharmacy? No  (Agent: If no, request that the patient contact the pharmacy for the refill. If patient does not wish to contact the pharmacy document the reason why and proceed with request.)  Patient states his urologist use to prescribe medication but released him to his PCP to take over medication. Patient states the bottle reflects  no refills and would like as many refills as possible.    Preferred Pharmacy (with phone number or street name):  [facility] [id number], [place] Phone: [phone]  Fax: [phone]      Has the patient been seen for an appointment in the last year OR does the patient have an upcoming appointment? Yes.    Agent: Please be advised that RX refills may take up to 3 business days. We ask that you follow-up with your pharmacy.

## 2022-06-28 NOTE — Telephone Encounter (Signed)
Discussed biopsy results with patient, scheduled pt for Jan 10 for recheck of biopsy proven glomus tumor,  Patient would like to know if there is anything he can do about the pain in the finger since the biopsy was taken?

## 2022-06-28 NOTE — Telephone Encounter (Signed)
LM on VM please return my call  

## 2022-06-29 ENCOUNTER — Telehealth: Payer: Self-pay

## 2022-06-29 NOTE — Telephone Encounter (Signed)
Requested medication (s) are due for refill today: Yes  Requested medication (s) are on the active medication list: Yes  Last refill:  03/06/21  Future visit scheduled: Yes  Notes to clinic:  Unable to refill per protocol, last refill by another provider.      Requested Prescriptions  Pending Prescriptions Disp Refills   sildenafil (VIAGRA) 100 MG tablet 10 tablet     Sig: Take 1 tablet (100 mg total) by mouth as needed.     Urology: Erectile Dysfunction Agents Passed - 06/28/2022  4:11 PM      Passed - AST in normal range and within 360 days    AST  Date Value Ref Range Status  09/11/2021 34 0 - 40 IU/L Final         Passed - ALT in normal range and within 360 days    ALT  Date Value Ref Range Status  09/11/2021 20 0 - 44 IU/L Final         Passed - Last BP in normal range    BP Readings from Last 1 Encounters:  05/13/22 124/74         Passed - Valid encounter within last 12 months    Recent Outpatient Visits           2 months ago Acute cough   [facility] Myles Gip, DO   3 months ago Acute cough   [facility], [place], [place]   4 months ago Primary hypertension   [facility] Jerrol Banana., MD   5 months ago Vertigo   [facility] Jerrol Banana., MD   6 months ago [place], [place], PA-C       Future Appointments             In 1 month Ralene Bathe, MD [place]   In 3 months Ralene Bathe, MD [place]

## 2022-06-29 NOTE — Telephone Encounter (Signed)
Pt called back regarding. Sildenafil. Pt curt and angry that his medication for sildenafil has not been refilled. Pt stated "if someone has a problem refilling this, I will go somewhere else for my care. It is not like it is a narcotic. I really don't understand why it cannot go ahead and get refilled." "Barbarann Ehlers has prescribed this medication to me for years and now that I have a new doctor, there seems to be a problem." Advised pt that his refill request is pending review and there was a question whether he needed an appointment. Pt stated he will not wait until February and that he would like it refilled as soon as possible.

## 2022-06-29 NOTE — Telephone Encounter (Signed)
Patient called, left VM to return the call to the office. He may need an appointment with a provider to have this medication refilled, will route to the office.

## 2022-06-29 NOTE — Telephone Encounter (Signed)
Left detailed VM with Dr. Alveria Apley recommendations. aw

## 2022-06-30 MED ORDER — SILDENAFIL CITRATE 100 MG PO TABS: 100.0000 mg | ORAL_TABLET | ORAL | 1 refills | Status: AC | PRN

## 2022-06-30 NOTE — Telephone Encounter (Signed)
Please review

## 2022-07-01 ENCOUNTER — Encounter: Payer: Self-pay | Admitting: Neurosurgery

## 2022-07-01 ENCOUNTER — Ambulatory Visit (INDEPENDENT_AMBULATORY_CARE_PROVIDER_SITE_OTHER): Payer: Medicare Other | Admitting: Neurosurgery

## 2022-07-01 VITALS — BP 126/70 | Ht 71.0 in | Wt 186.0 lb

## 2022-07-01 DIAGNOSIS — M5416 Radiculopathy, lumbar region: Secondary | ICD-10-CM | POA: Diagnosis not present

## 2022-07-01 NOTE — Progress Notes (Signed)
Referring Physician:  Jerrol Banana., MD No address on file  Primary Physician:  Jerrol Banana., MD  History of Present Illness: 07/01/2022 [name] is doing much better.  He is doing physical therapy.  He had an injection about a month ago.  05/13/2022 [name] with a chief complaint of low back pain and right leg pain, has since improved with his injections on 05/06/22.  He had an L4-5 fusion approximately 4 years ago and did very well until approximately 1 month ago when he began having pain down his right leg that could be very severe.  His pain was in his right buttock and down his lateral thigh and calf.  It is currently much better after an injection.  Walking and standing make this pain worse.  He was getting pain after walking only a few feet.   Timing: constant Bowel/Bladder Dysfunction: none  Conservative measures:  Physical therapy: has not participated in Multimodal medical therapy including regular antiinflammatories: hydrocodone, oxycodone, mobic, tramadol, prednisone, robaxin, medrol dosepak, gabapentin Injections: has had epidural steroid injections 05/06/22: Right L4-5 ESI 04/15/14: Right L4-5 facet joint injection 01/28/14: Right L45 facet joint injection 11/23/13: Right L4-5 facet joint injection 08/31/13: Right L4-5 facet joint injection  Past Surgery:  L4-5 fusion in 2019  Dennis Steele has no symptoms of cervical myelopathy.  The symptoms are causing a significant impact on the patient's life.   Review of Systems:  A 10 point review of systems is negative, except for the pertinent positives and negatives detailed in the HPI.  Past Medical History: Past Medical History:  Diagnosis Date   Actinic keratosis    Basal cell carcinoma 03/29/2013   Right forearm   Basal cell carcinoma 08/05/2016   Right mid to proximal dorsum forearm   Dysplastic nevus 07/31/2015   Right low back   GERD (gastroesophageal reflux  disease)    Hypertension    OAG (open angle glaucoma)    Prostate cancer ([place])    Squamous cell carcinoma of skin 02/27/2009   Right medial calf    Past Surgical History: Past Surgical History:  Procedure Laterality Date   ANTERIOR CRUCIATE LIGAMENT REPAIR  25 yrs ago   not sure which side   COLONOSCOPY WITH PROPOFOL N/A 10/04/2016   Procedure: COLONOSCOPY WITH PROPOFOL;  Surgeon: Manya Silvas, MD;  Location: [facility];  Service: Endoscopy;  Laterality: N/A;   ESOPHAGOGASTRODUODENOSCOPY (EGD) WITH PROPOFOL N/A 10/04/2016   Procedure: ESOPHAGOGASTRODUODENOSCOPY (EGD) WITH PROPOFOL;  Surgeon: Manya Silvas, MD;  Location: [facility] ENDOSCOPY;  Service: Endoscopy;  Laterality: N/A;   EYE SURGERY  04/2015   lens surgery from a dislodged les from cataract surgery   LYMPHADENECTOMY  06/19/2012   Procedure: LYMPHADENECTOMY;  Surgeon: Dutch Gray, MD;  Location: [facility];  Service: Urology;  Laterality: Bilateral;   ROBOT ASSISTED LAPAROSCOPIC RADICAL PROSTATECTOMY  06/19/2012   Procedure: ROBOTIC ASSISTED LAPAROSCOPIC RADICAL PROSTATECTOMY LEVEL 2;  Surgeon: Dutch Gray, MD;  Location: [facility];  Service: Urology;  Laterality: N/A;   TOTAL HIP ARTHROPLASTY Right 2015    Allergies: Allergies as of 07/01/2022   (No Known Allergies)    Medications: No outpatient medications have been marked as taking for the 07/01/22 encounter (Appointment) with Meade Maw, MD.    Social History: Social History   Tobacco Use   Smoking status: Former    Types: Cigars   Smokeless tobacco: Never   Tobacco comments:  quit cig 2 yrs ago, social smoking only  Substance Use Topics   Alcohol use: Yes    Comment: occasional 3 x week   Drug use: No    Family Medical History: Family History  Problem Relation Age of Onset   Diabetes Mother    Cancer Mother        breast   Stroke Mother    Heart disease Mother    Cancer Father        stomach, prostate- pt not complete   Cancer Brother         esophagus    Physical Examination: There were no vitals filed for this visit.  General: Patient is well developed, well nourished, calm, collected, and in no apparent distress. Attention to examination is appropriate.  Neck:   Supple.  Full range of motion.  Respiratory: Patient is breathing without any difficulty.   NEUROLOGICAL:     Awake, alert, oriented to person, place, and time.  Speech is clear and fluent. Fund of knowledge is appropriate.   Cranial Nerves: Pupils equal round and reactive to light.  Facial tone is symmetric.  Facial sensation is symmetric. Shoulder shrug is symmetric. Tongue protrusion is midline.  There is no pronator drift.  ROM of spine: full.    Strength: Side Biceps Triceps Deltoid Interossei Grip Wrist Ext. Wrist Flex.  R '5 5 5 5 5 5 5  '$ L '5 5 5 5 5 5 5   '$ Side Iliopsoas Quads Hamstring PF DF EHL  R '5 5 5 5 5 5  '$ L '5 5 5 5 5 5   '$ Reflexes are12+ and symmetric at the biceps, triceps, brachioradialis, patella and achilles.   Hoffman's is absent.   Bilateral upper and lower extremity sensation is intact to light touch.    No evidence of dysmetria noted.  Gait is normal.     Medical Decision Making  Imaging: MRI L spine 05/01/22 IMPRESSION: 1. Postoperative changes at L4-L5, with a small fluid collection about the L4 spinous process, evaluation of which is limited by susceptibility artifact from hardware. Persistent moderate to severe spinal canal stenosis at this level with moderate bilateral neural foraminal narrowing. 2. L2-L3 moderate spinal canal stenosis.     Electronically Signed   By: Merilyn Baba M.D.   On: 05/03/2022 13:06  I have personally reviewed the images and agree with the above interpretation.  Assessment and Plan: [name] is a pleasant 71 y.o. male with recurrent lumbar radiculopathy due to recurrent stenosis at L4-5.  He is currently doing much better due to his injection.  He will continue physical therapy  for now and begin the slow progress of increasing his activity.  I have offered to see him back in a few weeks to monitor his progress, but he will call me if he has worsening of his pain.  He may need additional injections, but we will wait to see how his symptoms go over time.   Thank you for involving me in the care of this patient.      Denesha Brouse K. Izora Ribas MD, [facility] Neurosurgery

## 2022-07-02 ENCOUNTER — Encounter: Payer: Self-pay | Admitting: Dermatology

## 2022-07-05 ENCOUNTER — Other Ambulatory Visit: Payer: Self-pay

## 2022-07-05 ENCOUNTER — Telehealth: Payer: Self-pay

## 2022-07-05 DIAGNOSIS — D1800 Hemangioma unspecified site: Secondary | ICD-10-CM

## 2022-07-05 DIAGNOSIS — M5416 Radiculopathy, lumbar region: Secondary | ICD-10-CM | POA: Diagnosis not present

## 2022-07-05 DIAGNOSIS — M5459 Other low back pain: Secondary | ICD-10-CM | POA: Diagnosis not present

## 2022-07-05 NOTE — Progress Notes (Signed)
Referral sent to hand surgeon Dr Leretha Pol at [facility]

## 2022-07-05 NOTE — Telephone Encounter (Signed)
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  recommend evaluate for possible excision with a hand surgeon.   Referral sent to [facility] Dr Peggye Ley, patient aware referral sent

## 2022-07-07 DIAGNOSIS — M9903 Segmental and somatic dysfunction of lumbar region: Secondary | ICD-10-CM | POA: Diagnosis not present

## 2022-07-07 DIAGNOSIS — M5136 Other intervertebral disc degeneration, lumbar region: Secondary | ICD-10-CM | POA: Diagnosis not present

## 2022-07-07 DIAGNOSIS — M62830 Muscle spasm of back: Secondary | ICD-10-CM | POA: Diagnosis not present

## 2022-07-07 DIAGNOSIS — M9902 Segmental and somatic dysfunction of thoracic region: Secondary | ICD-10-CM | POA: Diagnosis not present

## 2022-07-08 DIAGNOSIS — Z23 Encounter for immunization: Secondary | ICD-10-CM | POA: Diagnosis not present

## 2022-07-13 DIAGNOSIS — M79644 Pain in right finger(s): Secondary | ICD-10-CM | POA: Diagnosis not present

## 2022-07-28 DIAGNOSIS — D1801 Hemangioma of skin and subcutaneous tissue: Secondary | ICD-10-CM | POA: Diagnosis not present

## 2022-07-28 DIAGNOSIS — D367 Benign neoplasm of other specified sites: Secondary | ICD-10-CM | POA: Diagnosis not present

## 2022-07-28 DIAGNOSIS — L608 Other nail disorders: Secondary | ICD-10-CM | POA: Diagnosis not present

## 2022-08-02 DIAGNOSIS — H401321 Pigmentary glaucoma, left eye, mild stage: Secondary | ICD-10-CM | POA: Diagnosis not present

## 2022-08-02 DIAGNOSIS — H4010X3 Unspecified open-angle glaucoma, severe stage: Secondary | ICD-10-CM | POA: Diagnosis not present

## 2022-08-03 DIAGNOSIS — M5416 Radiculopathy, lumbar region: Secondary | ICD-10-CM | POA: Diagnosis not present

## 2022-08-03 DIAGNOSIS — M5459 Other low back pain: Secondary | ICD-10-CM | POA: Diagnosis not present

## 2022-08-04 ENCOUNTER — Ambulatory Visit: Payer: Medicare Other | Admitting: Dermatology

## 2022-08-11 ENCOUNTER — Telehealth: Payer: Medicare Other | Admitting: Physician Assistant

## 2022-08-11 DIAGNOSIS — M62830 Muscle spasm of back: Secondary | ICD-10-CM | POA: Diagnosis not present

## 2022-08-11 DIAGNOSIS — M9902 Segmental and somatic dysfunction of thoracic region: Secondary | ICD-10-CM | POA: Diagnosis not present

## 2022-08-11 DIAGNOSIS — M5136 Other intervertebral disc degeneration, lumbar region: Secondary | ICD-10-CM | POA: Diagnosis not present

## 2022-08-11 DIAGNOSIS — M9903 Segmental and somatic dysfunction of lumbar region: Secondary | ICD-10-CM | POA: Diagnosis not present

## 2022-08-11 NOTE — Progress Notes (Signed)
Pt not seen today. He could not meet the original time and opted to reschedule his appointment.

## 2022-08-12 ENCOUNTER — Ambulatory Visit (INDEPENDENT_AMBULATORY_CARE_PROVIDER_SITE_OTHER): Payer: Medicare Other | Admitting: Physician Assistant

## 2022-08-12 ENCOUNTER — Encounter: Payer: Self-pay | Admitting: Physician Assistant

## 2022-08-12 VITALS — BP 117/73 | HR 75 | Ht 70.5 in | Wt 207.4 lb

## 2022-08-12 DIAGNOSIS — Z23 Encounter for immunization: Secondary | ICD-10-CM

## 2022-08-12 DIAGNOSIS — R198 Other specified symptoms and signs involving the digestive system and abdomen: Secondary | ICD-10-CM

## 2022-08-12 NOTE — Progress Notes (Signed)
I,Sha'taria Tyson,acting as a Education administrator for [employer], PA-C.,have documented all relevant documentation on the behalf of Dennis Kirschner, PA-C,as directed by  Dennis Kirschner, PA-C while in the presence of Dennis Kirschner, PA-C.   Established patient visit   Patient: Dennis Steele   DOB: May 06, 1951   72 y.o. Male  MRN: [id number] Visit Date: 08/12/2022  Today's healthcare provider: [name], PA-C   Cc. Diarrhea and constipation x 4-5 days  Subjective     Pt reports diarrhea that started Friday and Saturday, 4-5 days ago, 5-10 episodes per day. Denies any associated rectal bleeding, abdominal pain.  Reports small bowel movement Sunday or Monday but now bowel movements since then. Reports high level of gas, burping, bloating. Reports taking TUMS with limited improvement. Denies fevers, body aches, current abdominal pain.  Medications: Outpatient Medications Prior to Visit  Medication Sig   fluticasone (FLONASE) 50 MCG/ACT nasal spray Place 2 sprays into both nostrils daily.   ketorolac (ACULAR) 0.4 % SOLN INSTILL 1 DROP IN RIGHT EYE FOUR TIMES DAILY   latanoprost (XALATAN) 0.005 % ophthalmic solution Place 1 drop into the left eye at bedtime.   meloxicam (MOBIC) 7.5 MG tablet TAKE ONE TABLET BY MOUTH TWICE DAILY AS NEEDED FOR PAIN   MULTIPLE VITAMIN PO Take 1 tablet by mouth daily.   mupirocin ointment (BACTROBAN) 2 % Apply 1 Application topically 2 (two) times daily. Apply topically to wound until healed.   prednisoLONE acetate (PRED FORTE) 1 % ophthalmic suspension SHAKE LIQUID AND INSTILL 1 DROP IN RIGHT EYE FOUR TIMES DAILY   rosuvastatin (CRESTOR) 10 MG tablet Take 10 mg by mouth daily.   sildenafil (VIAGRA) 100 MG tablet Take 1 tablet (100 mg total) by mouth as needed.   timolol (TIMOPTIC) 0.5 % ophthalmic solution Place 1 drop into the right eye every morning.   triamterene-hydrochlorothiazide (DYAZIDE) 37.5-25 MG capsule Take 1 each (1 capsule total) by mouth every  morning.   Turmeric (QC TUMERIC COMPLEX PO) Take by mouth.   valACYclovir (VALTREX) 1000 MG tablet Take 1,000 mg by mouth as needed. Only for outbreaks of fever blisters   No facility-administered medications prior to visit.    Review of Systems  Gastrointestinal:  Positive for diarrhea.      Objective    Blood pressure 117/73, pulse 75, height 5' 10.5" (1.791 m), weight 207 lb 6.4 oz (94.1 kg), SpO2 100 %.   Physical Exam Constitutional:      General: He is awake.     Appearance: He is well-developed.  HENT:     Head: Normocephalic.  Eyes:     Conjunctiva/sclera: Conjunctivae normal.  Cardiovascular:     Rate and Rhythm: Normal rate and regular rhythm.     Heart sounds: Normal heart sounds.  Pulmonary:     Effort: Pulmonary effort is normal.     Breath sounds: Normal breath sounds.  Abdominal:     General: Bowel sounds are normal. There is no distension.     Palpations: Abdomen is soft.     Tenderness: There is abdominal tenderness in the left lower quadrant. There is no guarding or rebound.     Comments: Very mild tenderness LLQ  Skin:    General: Skin is warm.  Neurological:     Mental Status: He is alert and oriented to person, place, and time.  Psychiatric:        Attention and Perception: Attention normal.        Mood and Affect:  Mood normal.        Speech: Speech normal.        Behavior: Behavior is cooperative.      No results found for any visits on 08/12/22.  Assessment & Plan     Diarrhea and constipation Advised increase fluids, take famotidine OTC for belching/reflux  Can take probiotics or start metamucil supplement PRN   Problem List Items Addressed This Visit   None Visit Diagnoses     Alternating constipation and diarrhea    -  Primary   Need for COVID-19 vaccine       Relevant Orders   Pfizer Fall 2023 Covid-19 Vaccine 83yr and older       Return if symptoms worsen or fail to improve.      I, LMikey Kirschner PA-C have reviewed all  documentation for this visit. The documentation on 08/12/22 for the exam, diagnosis, procedures, and orders are all accurate and complete.  LMikey Kirschner PA-C [facility] [place] [id number]: [phone]: 3[place]

## 2022-08-30 ENCOUNTER — Encounter: Payer: Medicare Other | Admitting: Family Medicine

## 2022-08-31 DIAGNOSIS — M5416 Radiculopathy, lumbar region: Secondary | ICD-10-CM | POA: Diagnosis not present

## 2022-08-31 DIAGNOSIS — M5459 Other low back pain: Secondary | ICD-10-CM | POA: Diagnosis not present

## 2022-09-13 DIAGNOSIS — H35351 Cystoid macular degeneration, right eye: Secondary | ICD-10-CM | POA: Diagnosis not present

## 2022-09-13 DIAGNOSIS — H401321 Pigmentary glaucoma, left eye, mild stage: Secondary | ICD-10-CM | POA: Diagnosis not present

## 2022-09-13 DIAGNOSIS — H4010X3 Unspecified open-angle glaucoma, severe stage: Secondary | ICD-10-CM | POA: Diagnosis not present

## 2022-09-15 DIAGNOSIS — M62830 Muscle spasm of back: Secondary | ICD-10-CM | POA: Diagnosis not present

## 2022-09-15 DIAGNOSIS — M9903 Segmental and somatic dysfunction of lumbar region: Secondary | ICD-10-CM | POA: Diagnosis not present

## 2022-09-15 DIAGNOSIS — M5136 Other intervertebral disc degeneration, lumbar region: Secondary | ICD-10-CM | POA: Diagnosis not present

## 2022-09-15 DIAGNOSIS — M9902 Segmental and somatic dysfunction of thoracic region: Secondary | ICD-10-CM | POA: Diagnosis not present

## 2022-09-29 ENCOUNTER — Ambulatory Visit: Payer: Medicare Other | Admitting: Dermatology

## 2022-11-10 ENCOUNTER — Telehealth: Payer: Self-pay | Admitting: Neurosurgery

## 2022-11-10 DIAGNOSIS — M5416 Radiculopathy, lumbar region: Secondary | ICD-10-CM

## 2022-11-10 DIAGNOSIS — M9903 Segmental and somatic dysfunction of lumbar region: Secondary | ICD-10-CM | POA: Diagnosis not present

## 2022-11-10 DIAGNOSIS — M62830 Muscle spasm of back: Secondary | ICD-10-CM | POA: Diagnosis not present

## 2022-11-10 DIAGNOSIS — M5136 Other intervertebral disc degeneration, lumbar region: Secondary | ICD-10-CM | POA: Diagnosis not present

## 2022-11-10 DIAGNOSIS — M9902 Segmental and somatic dysfunction of thoracic region: Secondary | ICD-10-CM | POA: Diagnosis not present

## 2022-11-10 NOTE — Telephone Encounter (Signed)
Discussed with Stacy. If his symptoms have not changed, we can place a referral. If he has new/different symptoms, he should come in for an appointment to see Kennyth Arnold or Duwayne Heck. Also need to confirm that he wants Dr Yves Dill, as I do not see where he has ever seen Dr Yves Dill (it appears his previous injections were at [facility]).  Left message for pt to return call to discuss.

## 2022-11-10 NOTE — Telephone Encounter (Signed)
Patient called back. All his symptoms are the same. Nothing new. He knows that he will benefit from an injection. He is not sure where he had his last injection if it was at [facility] or not. He wants to stay local. So if [facility]  does injections he will go there but if not he will see Dr.Chasnis at [facility].  [phone]

## 2022-11-10 NOTE — Telephone Encounter (Signed)
Patient last seen 07/01/2022. He was doing better with the injections. His hurting again. He would like to try injections again. Should he see Dr.Yarbrough first or can he get a referral to see Dr.Chasnis?

## 2022-11-10 NOTE — Telephone Encounter (Signed)
Pt told Patty that he believes you have to go to [place] or [place] for injections with [facility]. He has a business card from Dr Yves Dill and would like to stay local.  Discussed with Kennyth Arnold. Referral placed to Dr Yves Dill

## 2022-11-15 DIAGNOSIS — H4010X3 Unspecified open-angle glaucoma, severe stage: Secondary | ICD-10-CM | POA: Diagnosis not present

## 2022-11-15 DIAGNOSIS — H35351 Cystoid macular degeneration, right eye: Secondary | ICD-10-CM | POA: Diagnosis not present

## 2022-11-17 DIAGNOSIS — H0011 Chalazion right upper eyelid: Secondary | ICD-10-CM | POA: Diagnosis not present

## 2022-11-23 DIAGNOSIS — M5416 Radiculopathy, lumbar region: Secondary | ICD-10-CM | POA: Diagnosis not present

## 2022-11-23 DIAGNOSIS — M48062 Spinal stenosis, lumbar region with neurogenic claudication: Secondary | ICD-10-CM | POA: Diagnosis not present

## 2022-12-01 DIAGNOSIS — Z0001 Encounter for general adult medical examination with abnormal findings: Secondary | ICD-10-CM | POA: Diagnosis not present

## 2022-12-01 DIAGNOSIS — Z0000 Encounter for general adult medical examination without abnormal findings: Secondary | ICD-10-CM | POA: Diagnosis not present

## 2022-12-01 DIAGNOSIS — I10 Essential (primary) hypertension: Secondary | ICD-10-CM | POA: Diagnosis not present

## 2022-12-01 DIAGNOSIS — Z79899 Other long term (current) drug therapy: Secondary | ICD-10-CM | POA: Diagnosis not present

## 2022-12-01 DIAGNOSIS — Z87891 Personal history of nicotine dependence: Secondary | ICD-10-CM | POA: Diagnosis not present

## 2022-12-01 DIAGNOSIS — Z125 Encounter for screening for malignant neoplasm of prostate: Secondary | ICD-10-CM | POA: Diagnosis not present

## 2022-12-08 DIAGNOSIS — M62830 Muscle spasm of back: Secondary | ICD-10-CM | POA: Diagnosis not present

## 2022-12-08 DIAGNOSIS — M9903 Segmental and somatic dysfunction of lumbar region: Secondary | ICD-10-CM | POA: Diagnosis not present

## 2022-12-08 DIAGNOSIS — M5136 Other intervertebral disc degeneration, lumbar region: Secondary | ICD-10-CM | POA: Diagnosis not present

## 2022-12-08 DIAGNOSIS — M9902 Segmental and somatic dysfunction of thoracic region: Secondary | ICD-10-CM | POA: Diagnosis not present

## 2022-12-13 DIAGNOSIS — M5416 Radiculopathy, lumbar region: Secondary | ICD-10-CM | POA: Diagnosis not present

## 2022-12-13 DIAGNOSIS — M48062 Spinal stenosis, lumbar region with neurogenic claudication: Secondary | ICD-10-CM | POA: Diagnosis not present

## 2022-12-15 ENCOUNTER — Ambulatory Visit (INDEPENDENT_AMBULATORY_CARE_PROVIDER_SITE_OTHER): Payer: Medicare Other | Admitting: Dermatology

## 2022-12-15 DIAGNOSIS — L821 Other seborrheic keratosis: Secondary | ICD-10-CM | POA: Diagnosis not present

## 2022-12-15 DIAGNOSIS — W908XXA Exposure to other nonionizing radiation, initial encounter: Secondary | ICD-10-CM

## 2022-12-15 DIAGNOSIS — Z79899 Other long term (current) drug therapy: Secondary | ICD-10-CM

## 2022-12-15 DIAGNOSIS — X32XXXA Exposure to sunlight, initial encounter: Secondary | ICD-10-CM | POA: Diagnosis not present

## 2022-12-15 DIAGNOSIS — Z7189 Other specified counseling: Secondary | ICD-10-CM

## 2022-12-15 DIAGNOSIS — L820 Inflamed seborrheic keratosis: Secondary | ICD-10-CM

## 2022-12-15 DIAGNOSIS — L814 Other melanin hyperpigmentation: Secondary | ICD-10-CM | POA: Diagnosis not present

## 2022-12-15 DIAGNOSIS — L570 Actinic keratosis: Secondary | ICD-10-CM

## 2022-12-15 DIAGNOSIS — Z1283 Encounter for screening for malignant neoplasm of skin: Secondary | ICD-10-CM | POA: Diagnosis not present

## 2022-12-15 DIAGNOSIS — L578 Other skin changes due to chronic exposure to nonionizing radiation: Secondary | ICD-10-CM

## 2022-12-15 DIAGNOSIS — Z8589 Personal history of malignant neoplasm of other organs and systems: Secondary | ICD-10-CM

## 2022-12-15 DIAGNOSIS — D229 Melanocytic nevi, unspecified: Secondary | ICD-10-CM

## 2022-12-15 DIAGNOSIS — Z85828 Personal history of other malignant neoplasm of skin: Secondary | ICD-10-CM | POA: Diagnosis not present

## 2022-12-15 DIAGNOSIS — L719 Rosacea, unspecified: Secondary | ICD-10-CM

## 2022-12-15 DIAGNOSIS — D1801 Hemangioma of skin and subcutaneous tissue: Secondary | ICD-10-CM | POA: Diagnosis not present

## 2022-12-15 NOTE — Patient Instructions (Addendum)
Instructions for [facility] Medications  One or more of your medications was sent to the [facility] mail order compounding pharmacy. You will receive an email from them and can purchase the medicine through that link. It will then be mailed to your home at the address you confirmed. If for any reason you do not receive an email from them, please check your spam folder. If you still do not find the email, please let us know. [facility] phone number is [phone].    Cryotherapy Aftercare  Wash gently with soap and water everyday.   Apply Vaseline and Band-Aid daily until healed.    Due to recent changes in healthcare laws, you may see results of your pathology and/or laboratory studies on MyChart before the doctors have had a chance to review them. We understand that in some cases there may be results that are confusing or concerning to you. Please understand that not all results are received at the same time and often the doctors may need to interpret multiple results in order to provide you with the best plan of care or course of treatment. Therefore, we ask that you please give us 2 business days to thoroughly review all your results before contacting the office for clarification. Should we see a critical lab result, you will be contacted sooner.   If You Need Anything After Your Visit  If you have any questions or concerns for your doctor, please call our main line at [phone] and press option 4 to reach your doctor's medical assistant. If no one answers, please leave a voicemail as directed and we will return your call as soon as possible. Messages left after 4 pm will be answered the following business day.   You may also send us a message via MyChart. We typically respond to MyChart messages within 1-2 business days.  For prescription refills, please ask your pharmacy to contact our office. Our fax number is [phone].  If you have an urgent issue when the clinic  is closed that cannot wait until the next business day, you can page your doctor at the number below.    Please note that while we do our best to be available for urgent issues outside of office hours, we are not available 24/7.   If you have an urgent issue and are unable to reach us, you may choose to seek medical care at your doctor's office, retail clinic, urgent care center, or emergency room.  If you have a medical emergency, please immediately call 911 or go to the emergency department.  Pager Numbers  - Dr. Kowalski: [phone]  - Dr. Moye: [phone]  - Dr. Stewart: [phone]  In the event of inclement weather, please call our main line at [phone] for an update on the status of any delays or closures.  Dermatology Medication Tips: Please keep the boxes that topical medications come in in order to help keep track of the instructions about where and how to use these. Pharmacies typically print the medication instructions only on the boxes and not directly on the medication tubes.   If your medication is too expensive, please contact our office at [phone] option 4 or send us a message through MyChart.   We are unable to tell what your co-pay for medications will be in advance as this is different depending on your insurance coverage. However, we may be able to find a substitute medication at lower cost or fill out paperwork to get insurance to cover   a needed medication.   If a prior authorization is required to get your medication covered by your insurance company, please allow us 1-2 business days to complete this process.  Drug prices often vary depending on where the prescription is filled and some pharmacies may offer cheaper prices.  The website www.goodrx.com contains coupons for medications through different pharmacies. The prices here do not account for what the cost may be with help from insurance (it may be cheaper with your insurance), but the website  can give you the price if you did not use any insurance.  - You can print the associated coupon and take it with your prescription to the pharmacy.  - You may also stop by our office during regular business hours and pick up a GoodRx coupon card.  - If you need your prescription sent electronically to a different pharmacy, notify our office through  MyChart or by phone at [phone] option 4.     Si Usted Necesita Algo Despus de Su Visita  Tambin puede enviarnos un mensaje a travs de MyChart. Por lo general respondemos a los mensajes de MyChart en el transcurso de 1 a 2 das hbiles.  Para renovar recetas, por favor pida a su farmacia que se ponga en contacto con nuestra oficina. Nuestro nmero de fax es el [phone].  Si tiene un asunto urgente cuando la clnica est cerrada y que no puede esperar hasta el siguiente da hbil, puede llamar/localizar a su doctor(a) al nmero que aparece a continuacin.   Por favor, tenga en cuenta que aunque hacemos todo lo posible para estar disponibles para asuntos urgentes fuera del horario de oficina, no estamos disponibles las 24 horas del da, los 7 das de la semana.   Si tiene un problema urgente y no puede comunicarse con nosotros, puede optar por buscar atencin mdica  en el consultorio de su doctor(a), en una clnica privada, en un centro de atencin urgente o en una sala de emergencias.  Si tiene una emergencia mdica, por favor llame inmediatamente al 911 o vaya a la sala de emergencias.  Nmeros de bper  - Dr. Kowalski: [phone]  - Dra. Moye: [phone]  - Dra. Stewart: [phone]  En caso de inclemencias del tiempo, por favor llame a nuestra lnea principal al [phone] para una actualizacin sobre el estado de cualquier retraso o cierre.  Consejos para la medicacin en dermatologa: Por favor, guarde las cajas en las que vienen los medicamentos de uso tpico para ayudarle a seguir las instrucciones  sobre dnde y cmo usarlos. Las farmacias generalmente imprimen las instrucciones del medicamento slo en las cajas y no directamente en los tubos del medicamento.   Si su medicamento es muy caro, por favor, pngase en contacto con nuestra oficina llamando al [phone] y presione la opcin 4 o envenos un mensaje a travs de MyChart.   No podemos decirle cul ser su copago por los medicamentos por adelantado ya que esto es diferente dependiendo de la cobertura de su seguro. Sin embargo, es posible que podamos encontrar un medicamento sustituto a menor costo o llenar un formulario para que el seguro cubra el medicamento que se considera necesario.   Si se requiere una autorizacin previa para que su compaa de seguros cubra su medicamento, por favor permtanos de 1 a 2 das hbiles para completar este proceso.  Los precios de los medicamentos varan con frecuencia dependiendo del lugar de dnde se surte la receta y alguna farmacias pueden ofrecer precios ms baratos.    El sitio web www.goodrx.com tiene cupones para medicamentos de diferentes farmacias. Los precios aqu no tienen en cuenta lo que podra costar con la ayuda del seguro (puede ser ms barato con su seguro), pero el sitio web puede darle el precio si no utiliz ningn seguro.  - Puede imprimir el cupn correspondiente y llevarlo con su receta a la farmacia.  - Tambin puede pasar por nuestra oficina durante el horario de atencin regular y recoger una tarjeta de cupones de GoodRx.  - Si necesita que su receta se enve electrnicamente a una farmacia diferente, informe a nuestra oficina a travs de MyChart de [place] o por telfono llamando al [phone] y presione la opcin 4.  

## 2022-12-15 NOTE — Progress Notes (Signed)
Follow-Up Visit   Subjective  Dennis Steele is a 72 y.o. male who presents for the following: Skin Cancer Screening and Full Body Skin Exam, hx of BCC, hx of SCC  The patient presents for Total-Body Skin Exam (TBSE) for skin cancer screening and mole check. The patient has spots, moles and lesions to be evaluated, some may be new or changing and the patient has concerns that these could be cancer.  The following portions of the chart were reviewed this encounter and updated as appropriate: medications, allergies, medical history  Review of Systems:  No other skin or systemic complaints except as noted in HPI or Assessment and Plan.  Objective  Well appearing patient in no apparent distress; mood and affect are within normal limits.  A full examination was performed including scalp, head, eyes, ears, nose, lips, neck, chest, axillae, abdomen, back, buttocks, bilateral upper extremities, bilateral lower extremities, hands, feet, fingers, toes, fingernails, and toenails. All findings within normal limits unless otherwise noted below.   Relevant physical exam findings are noted in the Assessment and Plan.  face x 6 (6) Erythematous thin papules/macules with gritty scale.   back x 10, legs x 21 (31) Stuck-on, waxy, tan-brown papules --Discussed benign etiology and prognosis.    Assessment & Plan   LENTIGINES, SEBORRHEIC KERATOSES, HEMANGIOMAS - Benign normal skin lesions - Benign-appearing - Call for any changes  MELANOCYTIC NEVI - Tan-brown and/or pink-flesh-colored symmetric macules and papules - Benign appearing on exam today - Observation - Call clinic for new or changing moles - Recommend daily use of broad spectrum spf 30+ sunscreen to sun-exposed areas.   ACTINIC DAMAGE - Chronic condition, secondary to cumulative UV/sun exposure - diffuse scaly erythematous macules with underlying dyspigmentation - Recommend daily broad spectrum sunscreen SPF 30+ to sun-exposed areas,  reapply every 2 hours as needed.  - Staying in the shade or wearing long sleeves, sun glasses (UVA+UVB protection) and wide brim hats (4-inch brim around the entire circumference of the hat) are also recommended for sun protection.  - Call for new or changing lesions.  HISTORY OF BASAL CELL CARCINOMA OF THE SKIN - No evidence of recurrence today - Recommend regular full body skin exams - Recommend daily broad spectrum sunscreen SPF 30+ to sun-exposed areas, reapply every 2 hours as needed.  - Call if any new or changing lesions are noted between office visits   HISTORY OF SQUAMOUS CELL CARCINOMA OF THE SKIN - No evidence of recurrence today - No lymphadenopathy - Recommend regular full body skin exams - Recommend daily broad spectrum sunscreen SPF 30+ to sun-exposed areas, reapply every 2 hours as needed.  - Call if any new or changing lesions are noted between office visits  ROSACEA Exam Mid face erythema with telangiectasias +/- scattered inflammatory papules face  Chronic and persistent condition with duration or expected duration over one year. Condition is symptomatic/ bothersome to patient. Not currently at goal.  Rosacea is a chronic progressive skin condition usually affecting the face of adults, causing redness and/or acne bumps. It is treatable but not curable. It sometimes affects the eyes (ocular rosacea) as well. It may respond to topical and/or systemic medication and can flare with stress, sun exposure, alcohol, exercise, topical steroids (including hydrocortisone/cortisone 10) and some foods.  Daily application of broad spectrum spf 30+ sunscreen to face is recommended to reduce flares. Treatment Plan Start [facility] rosacea triple cream apply to face qd-bid    GLOMUS TUMOR  Treated at [facility]  othro surgery in December 2023  right 2nd fingernail proximal Healing well today.  SKIN CANCER SCREENING PERFORMED TODAY.  AK (actinic keratosis) (6) face x 6  Actinic  keratoses are precancerous spots that appear secondary to cumulative UV radiation exposure/sun exposure over time. They are chronic with expected duration over 1 year. A portion of actinic keratoses will progress to squamous cell carcinoma of the skin. It is not possible to reliably predict which spots will progress to skin cancer and so treatment is recommended to prevent development of skin cancer.  Recommend daily broad spectrum sunscreen SPF 30+ to sun-exposed areas, reapply every 2 hours as needed.  Recommend staying in the shade or wearing long sleeves, sun glasses (UVA+UVB protection) and wide brim hats (4-inch brim around the entire circumference of the hat). Call for new or changing lesions.   Destruction of lesion - face x 6 Complexity: simple   Destruction method: cryotherapy   Informed consent: discussed and consent obtained   Timeout:  patient name, date of birth, surgical site, and procedure verified Lesion destroyed using liquid nitrogen: Yes   Region frozen until ice ball extended beyond lesion: Yes   Outcome: patient tolerated procedure well with no complications   Post-procedure details: wound care instructions given    Inflamed seborrheic keratosis (31) back x 10, legs x 21  Symptomatic, irritating, patient would like treated.   Destruction of lesion - back x 10, legs x 21 Complexity: simple   Destruction method: cryotherapy   Informed consent: discussed and consent obtained   Timeout:  patient name, date of birth, surgical site, and procedure verified Lesion destroyed using liquid nitrogen: Yes   Region frozen until ice ball extended beyond lesion: Yes   Outcome: patient tolerated procedure well with no complications   Post-procedure details: wound care instructions given    Actinic skin damage  Skin cancer screening  Lentigo  Melanocytic nevus, unspecified location  Rosacea  Medication management  History of basal cell carcinoma  History of squamous  cell carcinoma  Return in about 1 year (around 12/15/2023) for TBSE. hx of SCC, hx of BCC.  IAngelique Holm, CMA, am acting as scribe for Armida Sans, MD .   Documentation: I have reviewed the above documentation for accuracy and completeness, and I agree with the above.  Armida Sans, MD

## 2022-12-26 ENCOUNTER — Encounter: Payer: Self-pay | Admitting: Dermatology

## 2023-01-05 DIAGNOSIS — M62830 Muscle spasm of back: Secondary | ICD-10-CM | POA: Diagnosis not present

## 2023-01-05 DIAGNOSIS — M9903 Segmental and somatic dysfunction of lumbar region: Secondary | ICD-10-CM | POA: Diagnosis not present

## 2023-01-05 DIAGNOSIS — M9902 Segmental and somatic dysfunction of thoracic region: Secondary | ICD-10-CM | POA: Diagnosis not present

## 2023-01-05 DIAGNOSIS — M5136 Other intervertebral disc degeneration, lumbar region: Secondary | ICD-10-CM | POA: Diagnosis not present

## 2023-01-06 ENCOUNTER — Other Ambulatory Visit: Payer: Self-pay

## 2023-01-06 ENCOUNTER — Telehealth: Payer: Self-pay | Admitting: Physician Assistant

## 2023-01-06 DIAGNOSIS — I10 Essential (primary) hypertension: Secondary | ICD-10-CM

## 2023-01-06 MED ORDER — TRIAMTERENE-HCTZ 37.5-25 MG PO CAPS
1.0000 | ORAL_CAPSULE | ORAL | 3 refills | Status: DC
Start: 2023-01-06 — End: 2023-12-29

## 2023-01-06 NOTE — Telephone Encounter (Signed)
[facility] pharmacy requesting prescription refill triamterene-hydrochlorothiazide (DYAZIDE) 37.5-25 MG capsule  Please advise 

## 2023-02-07 DIAGNOSIS — H401321 Pigmentary glaucoma, left eye, mild stage: Secondary | ICD-10-CM | POA: Diagnosis not present

## 2023-02-07 DIAGNOSIS — H4010X3 Unspecified open-angle glaucoma, severe stage: Secondary | ICD-10-CM | POA: Diagnosis not present

## 2023-02-08 ENCOUNTER — Telehealth: Payer: Self-pay | Admitting: Physician Assistant

## 2023-02-08 MED ORDER — MELOXICAM 7.5 MG PO TABS: 7.5000 mg | ORAL_TABLET | Freq: Two times a day (BID) | ORAL | 0 refills | Status: DC | PRN

## 2023-02-08 NOTE — Telephone Encounter (Signed)
 [facility] faxed refill request for the following medications:  meloxicam (MOBIC) 7.5 MG tablet    Please advise.  

## 2023-02-09 DIAGNOSIS — M62830 Muscle spasm of back: Secondary | ICD-10-CM | POA: Diagnosis not present

## 2023-02-09 DIAGNOSIS — M9902 Segmental and somatic dysfunction of thoracic region: Secondary | ICD-10-CM | POA: Diagnosis not present

## 2023-02-09 DIAGNOSIS — M9903 Segmental and somatic dysfunction of lumbar region: Secondary | ICD-10-CM | POA: Diagnosis not present

## 2023-02-09 DIAGNOSIS — M5136 Other intervertebral disc degeneration, lumbar region: Secondary | ICD-10-CM | POA: Diagnosis not present

## 2023-02-11 ENCOUNTER — Ambulatory Visit: Payer: Self-pay

## 2023-02-11 MED ORDER — NIRMATRELVIR/RITONAVIR (PAXLOVID)TABLET: 3.0000 | ORAL_TABLET | Freq: Two times a day (BID) | ORAL | 0 refills | Status: DC

## 2023-02-11 NOTE — Addendum Note (Signed)
Addended by: Malva Limes on: 02/11/2023 04:51 PM   Modules accepted: Orders

## 2023-02-11 NOTE — Telephone Encounter (Signed)
Advised 

## 2023-02-11 NOTE — Telephone Encounter (Signed)
Have sent prescription paxlovid to [facility]. Stop rosuvastatin for 10 days

## 2023-02-11 NOTE — Telephone Encounter (Signed)
Chief Complaint: Covid symptoms Symptoms: Cough, fatigue, low grade temperature  Frequency: onset 3 days ago Pertinent Negatives: Patient denies nausea, vomiting, SOB, chest pain Disposition: [] ED /[] Urgent Care (no appt availability in office) / [] Appointment(In office/virtual)/ []  [facility]/ [x] Home Care/ [] Refused Recommended Disposition /[] [facility]/ []  Follow-up with PCP Additional Notes: Patient states he just tested positive for Covid on a home test. He saw his son briefly yesterday who stated he was positive for covid, the patient immediately left his son's home. Patient stated he had a cough x3 days that has gradually gotten worse. Patient also reports having a fever but over the counter medication has lowered it to 99.8 at this time. Patient wanted to know if there is anything that can be prescribed to help get rid of the symptoms faster. Patient reports his symptoms are mild. If so patient would like it to be sent to [facility] in [place] on [address] Algis Downs patient that I would forward message to provider for additional recommendations.  Pt is calling in because he has a positive COVID test as of this morning and wants to speak with a nurse regarding medications and what he can do. Pt isn't having any severe symptoms just a dry hacking cough and a slight temperature.  Reason for Disposition  [1] COVID-19 diagnosed by positive lab test (e.g., PCR, rapid self-test kit) AND [2] NO symptoms (e.g., cough, fever, others)  Answer Assessment - Initial Assessment Questions 1. COVID-19 DIAGNOSIS: "How do you know that you have COVID?" (e.g., positive lab test or self-test, diagnosed by doctor or NP/PA, symptoms after exposure).     I took a home test 30 minutes ago  2. COVID-19 EXPOSURE: "Was there any known exposure to COVID before the symptoms began?" CDC Definition of close contact: within 6 feet (2 meters) for a total of 15 minutes or more over a 24-hour  period.      I was in [place] with my son and he tested positive a few days ago 3. ONSET: "When did the COVID-19 symptoms start?"      3 days ago 4. WORST SYMPTOM: "What is your worst symptom?" (e.g., cough, fever, shortness of breath, muscle aches)     Cough 5. COUGH: "Do you have a cough?" If Yes, ask: "How bad is the cough?"       5/10 6. FEVER: "Do you have a fever?" If Yes, ask: "What is your temperature, how was it measured, and when did it start?"     99.8 degrees 30 minutes ago 7. RESPIRATORY STATUS: "Describe your breathing?" (e.g., normal; shortness of breath, wheezing, unable to speak)      No 8. BETTER-SAME-WORSE: "Are you getting better, staying the same or getting worse compared to yesterday?"  If getting worse, ask, "In what way?"     Worse  9. OTHER SYMPTOMS: "Do you have any other symptoms?"  (e.g., chills, fatigue, headache, loss of smell or taste, muscle pain, sore throat)     No 10. HIGH RISK DISEASE: "Do you have any chronic medical problems?" (e.g., asthma, heart or lung disease, weak immune system, obesity, etc.)       No 11. VACCINE: "Have you had the COVID-19 vaccine?" If Yes, ask: "Which one, how many shots, when did you get it?"       Yes, I had 3 total  Protocols used: Coronavirus (COVID-19) Diagnosed or Suspected-A-AH

## 2023-02-12 ENCOUNTER — Other Ambulatory Visit: Payer: Self-pay

## 2023-02-12 ENCOUNTER — Emergency Department: Payer: Medicare Other

## 2023-02-12 ENCOUNTER — Emergency Department
Admission: EM | Admit: 2023-02-12 | Discharge: 2023-02-12 | Disposition: A | Discharge status: Home / Self Care | Payer: Medicare Other | Attending: Emergency Medicine | Admitting: Emergency Medicine

## 2023-02-12 DIAGNOSIS — Z1152 Encounter for screening for COVID-19: Secondary | ICD-10-CM | POA: Insufficient documentation

## 2023-02-12 DIAGNOSIS — N200 Calculus of kidney: Secondary | ICD-10-CM | POA: Diagnosis not present

## 2023-02-12 DIAGNOSIS — C61 Malignant neoplasm of prostate: Secondary | ICD-10-CM | POA: Diagnosis not present

## 2023-02-12 DIAGNOSIS — R112 Nausea with vomiting, unspecified: Secondary | ICD-10-CM | POA: Diagnosis not present

## 2023-02-12 DIAGNOSIS — R9431 Abnormal electrocardiogram [ECG] [EKG]: Secondary | ICD-10-CM | POA: Diagnosis not present

## 2023-02-12 DIAGNOSIS — R051 Acute cough: Secondary | ICD-10-CM | POA: Diagnosis not present

## 2023-02-12 DIAGNOSIS — E860 Dehydration: Secondary | ICD-10-CM

## 2023-02-12 DIAGNOSIS — R059 Cough, unspecified: Secondary | ICD-10-CM | POA: Diagnosis not present

## 2023-02-12 DIAGNOSIS — I10 Essential (primary) hypertension: Secondary | ICD-10-CM | POA: Insufficient documentation

## 2023-02-12 DIAGNOSIS — Z96612 Presence of left artificial shoulder joint: Secondary | ICD-10-CM | POA: Diagnosis not present

## 2023-02-12 DIAGNOSIS — R509 Fever, unspecified: Secondary | ICD-10-CM | POA: Diagnosis not present

## 2023-02-12 DIAGNOSIS — R0602 Shortness of breath: Secondary | ICD-10-CM | POA: Diagnosis not present

## 2023-02-12 DIAGNOSIS — K5730 Diverticulosis of large intestine without perforation or abscess without bleeding: Secondary | ICD-10-CM | POA: Diagnosis not present

## 2023-02-12 LAB — CBC
HCT: 36.9 % — ABNORMAL LOW (ref 39.0–52.0)
Hemoglobin: 12.5 g/dL — ABNORMAL LOW (ref 13.0–17.0)
MCH: 32.1 pg (ref 26.0–34.0)
MCHC: 33.9 g/dL (ref 30.0–36.0)
MCV: 94.6 fL (ref 80.0–100.0)
Platelets: 185 10*3/uL (ref 150–400)
RBC: 3.9 MIL/uL — ABNORMAL LOW (ref 4.22–5.81)
RDW: 13.1 % (ref 11.5–15.5)
WBC: 12.9 10*3/uL — ABNORMAL HIGH (ref 4.0–10.5)
nRBC: 0 % (ref 0.0–0.2)

## 2023-02-12 LAB — COMPREHENSIVE METABOLIC PANEL
ALT: 21 U/L (ref 0–44)
AST: 25 U/L (ref 15–41)
Albumin: 3.9 g/dL (ref 3.5–5.0)
Alkaline Phosphatase: 45 U/L (ref 38–126)
Anion gap: 6 (ref 5–15)
BUN: 21 mg/dL (ref 8–23)
CO2: 23 mmol/L (ref 22–32)
Calcium: 8.8 mg/dL — ABNORMAL LOW (ref 8.9–10.3)
Chloride: 102 mmol/L (ref 98–111)
Creatinine, Ser: 1.02 mg/dL (ref 0.61–1.24)
GFR, Estimated: >60 mL/min (ref >60)
Glucose, Bld: 104 mg/dL — ABNORMAL HIGH (ref 70–99)
Potassium: 3.7 mmol/L (ref 3.5–5.1)
Sodium: 131 mmol/L — ABNORMAL LOW (ref 135–145)
Total Bilirubin: 0.8 mg/dL (ref 0.3–1.2)
Total Protein: 7.2 g/dL (ref 6.5–8.1)

## 2023-02-12 LAB — URINALYSIS, ROUTINE W REFLEX MICROSCOPIC
Bilirubin Urine: NEGATIVE
Glucose, UA: NEGATIVE mg/dL
Hgb urine dipstick: NEGATIVE
Ketones, ur: NEGATIVE mg/dL
Leukocytes,Ua: NEGATIVE
Nitrite: NEGATIVE
Protein, ur: NEGATIVE mg/dL
Specific Gravity, Urine: 1.006 (ref 1.005–1.030)
pH: 6 (ref 5.0–8.0)

## 2023-02-12 LAB — RESP PANEL BY RT-PCR (RSV, FLU A&B, COVID)  RVPGX2
Influenza A by PCR: NEGATIVE
Influenza B by PCR: NEGATIVE
Resp Syncytial Virus by PCR: NEGATIVE
SARS Coronavirus 2 by RT PCR: NEGATIVE

## 2023-02-12 LAB — LACTIC ACID, PLASMA: Lactic Acid, Venous: 1.2 mmol/L (ref 0.5–1.9)

## 2023-02-12 LAB — TROPONIN I (HIGH SENSITIVITY)
Troponin I (High Sensitivity): 19 ng/L — ABNORMAL HIGH (ref <18)
Troponin I (High Sensitivity): 24 ng/L — ABNORMAL HIGH (ref <18)

## 2023-02-12 LAB — LIPASE, BLOOD: Lipase: 46 U/L (ref 11–51)

## 2023-02-12 MED ORDER — SODIUM CHLORIDE 0.9 % IV BOLUS
1000.0000 mL | Freq: Once | INTRAVENOUS | Status: AC
  Administered 2023-02-12: 1000 mL via INTRAVENOUS

## 2023-02-12 MED ORDER — ONDANSETRON HCL 4 MG/2ML IJ SOLN
4.0000 mg | Freq: Once | INTRAMUSCULAR | Status: AC | PRN
  Administered 2023-02-12: 4 mg via INTRAVENOUS
  Filled 2023-02-12: qty 2

## 2023-02-12 MED ORDER — BENZONATATE 100 MG PO CAPS: 100.0000 mg | ORAL_CAPSULE | Freq: Three times a day (TID) | ORAL | 0 refills | Status: DC | PRN

## 2023-02-12 MED ORDER — IOHEXOL 300 MG/ML  SOLN
100.0000 mL | Freq: Once | INTRAMUSCULAR | Status: AC | PRN
  Administered 2023-02-12: 100 mL via INTRAVENOUS

## 2023-02-12 MED ORDER — ACETAMINOPHEN 325 MG PO TABS
650.0000 mg | ORAL_TABLET | Freq: Once | ORAL | Status: DC
  Filled 2023-02-12: qty 2

## 2023-02-12 MED ORDER — AZITHROMYCIN 500 MG PO TABS
500.0000 mg | ORAL_TABLET | Freq: Once | ORAL | Status: AC
  Administered 2023-02-12: 500 mg via ORAL
  Filled 2023-02-12: qty 1

## 2023-02-12 MED ORDER — AZITHROMYCIN 250 MG PO TABS: ORAL_TABLET | ORAL | 0 refills | Status: AC

## 2023-02-12 NOTE — ED Triage Notes (Signed)
Pt to ed from home via POV for fever and vomiting. Pt took a at home COVID test. Posey Rea if it was positive or not. Pt is caox4, in no acute distress and ambulatory in triage. However, pt unsteady gait and placed in wheel chair. Pt has had several episodes of vomiting in triage.

## 2023-02-12 NOTE — ED Notes (Signed)
Pt taken to CT at this time.

## 2023-02-12 NOTE — ED Notes (Signed)
Called to subwait by family who states that pt is confused and trying to get up out of wheelchair.  Wife requesting pt be placed in a stretcher for comfort.  Explained to wife concern that pt would continue to try to get out of stretcher and injure himself. Pt wanting to leave at this time, but is notedly weak.  Pt encouraged to wait for review of labs and other testing.  Pt is agreeable at this time.  Will monitor.

## 2023-02-12 NOTE — ED Provider Notes (Signed)
[facility] Provider Note   Event Date/Time   First MD Initiated Contact with Patient 02/12/23 1835     (approximate) History  Emesis  HPI Dennis Steele is a 72 y.o. male with a past medical history of hypertension, GERD, and prostate cancer in remission who presents complaining of fever, vomiting, and dry cough over the last 2 days after a visit with his son who was positive for COVID.  Patient states that he took a COVID test at home that was "possibly positive".  Patient was in no acute distress however did have an unsteady gait and was placed in a wheelchair in triage.  Patient had several episodes of nonbloody, nonbilious emesis in triage as well. ROS: Patient currently denies any vision changes, tinnitus, difficulty speaking, facial droop, sore throat, chest pain, shortness of breath, abdominal pain, diarrhea, dysuria, or weakness/numbness/paresthesias in any extremity   Physical Exam  Triage Vital Signs: ED Triage Vitals  Encounter Vitals Group     BP 02/12/23 1532 135/67     Systolic BP Percentile --      Diastolic BP Percentile --      Pulse Rate 02/12/23 1532 74     Resp 02/12/23 1532 16     Temp 02/12/23 1532 100.3 F (37.9 C)     Temp Source 02/12/23 1532 Oral     SpO2 02/12/23 1532 98 %     Weight 02/12/23 1533 200 lb (90.7 kg)     Height 02/12/23 1533 6' (1.829 m)     Head Circumference --      Peak Flow --      Pain Score 02/12/23 1533 0     Pain Loc --      Pain Education --      Exclude from Growth Chart --    Most recent vital signs: Vitals:   02/12/23 1848 02/12/23 1943  BP: 125/76   Pulse: 85   Resp: (!) 21   Temp: (!) 100.7 F (38.2 C) 99.4 F (37.4 C)  SpO2: 93%    General: Awake, oriented x4. CV:  Good peripheral perfusion.  Resp:  Normal effort.  Abd:  No distention.  Other:  Elderly overweight Caucasian male laying in bed in no acute distress ED Results / Procedures / Treatments  Labs (all labs ordered are listed,  but only abnormal results are displayed) Labs Reviewed  COMPREHENSIVE METABOLIC PANEL - Abnormal; Notable for the following components:      Result Value   Sodium 131 (*)    Glucose, Bld 104 (*)    Calcium 8.8 (*)    All other components within normal limits  CBC - Abnormal; Notable for the following components:   WBC 12.9 (*)    RBC 3.90 (*)    Hemoglobin 12.5 (*)    HCT 36.9 (*)    All other components within normal limits  URINALYSIS, ROUTINE W REFLEX MICROSCOPIC - Abnormal; Notable for the following components:   Color, Urine YELLOW (*)    APPearance CLEAR (*)    All other components within normal limits  TROPONIN I (HIGH SENSITIVITY) - Abnormal; Notable for the following components:   Troponin I (High Sensitivity) 19 (*)    All other components within normal limits  TROPONIN I (HIGH SENSITIVITY) - Abnormal; Notable for the following components:   Troponin I (High Sensitivity) 24 (*)    All other components within normal limits  RESP PANEL BY RT-PCR (RSV, FLU A&B, COVID)  [id number]  SARS CORONAVIRUS 2 BY RT PCR  LIPASE, BLOOD  LACTIC ACID, PLASMA   EKG ED ECG REPORT I, Merwyn Katos, the attending physician, personally viewed and interpreted this ECG. Date: 02/12/2023 EKG Time: 1541 Rate: 81 Rhythm: normal sinus rhythm QRS Axis: normal Intervals: normal ST/T Wave abnormalities: normal Narrative Interpretation: no evidence of acute ischemia RADIOLOGY ED MD interpretation: One-view portable chest x-ray interpreted by me shows no evidence of acute abnormalities including no pneumonia, pneumothorax, or widened mediastinum  CT of the abdomen and pelvis with IV contrast interpreted independently by me and shows hepatic steatosis with punctate left midpole nephrolithiasis however no hydronephrosis.  There is left colonic diverticulosis as well as no active diverticulitis.  There is trace bilateral pleural effusions with bibasilar atelectasis.  There is a groundglass opacity  partially visualized in the first image of the left lower lobe -Agree with radiology assessment Official radiology report(s): DG Chest Port 1 View  Result Date: 02/12/2023 CLINICAL DATA:  Cough and shortness of breath EXAM: PORTABLE CHEST 1 VIEW COMPARISON:  Chest x-ray 07/28/2020 FINDINGS: Cardiomediastinal silhouette is within normal limits for projection. The lungs are clear. There is no pleural effusion or pneumothorax. Left shoulder arthroplasty is present. IMPRESSION: No active disease. Electronically Signed   By: Darliss Cheney M.D.   On: 02/12/2023 21:52   CT ABDOMEN PELVIS W CONTRAST  Result Date: 02/12/2023 CLINICAL DATA:  Sepsis Abdominal pain, acute, nonlocalized emesis fever, vomiting EXAM: CT ABDOMEN AND PELVIS WITH CONTRAST TECHNIQUE: Multidetector CT imaging of the abdomen and pelvis was performed using the standard protocol following bolus administration of intravenous contrast. RADIATION DOSE REDUCTION: This exam was performed according to the departmental dose-optimization program which includes automated exposure control, adjustment of the mA and/or kV according to patient size and/or use of iterative reconstruction technique. CONTRAST:  OMNIPAQUE IOHEXOL 300 MG/ML  SOLN COMPARISON:  None Available. FINDINGS: Lower chest: Airspace opacity partially visualized in the left lower lobe. This could reflect developing infiltrate/pneumonia. Bibasilar atelectasis. Trace bilateral effusions. Hepatobiliary: Diffuse low-density throughout the liver compatible with fatty infiltration. No focal abnormality. Gallbladder unremarkable. Pancreas: No focal abnormality or ductal dilatation. Spleen: No focal abnormality.  Normal size. Adrenals/Urinary Tract: Adrenal glands normal. 4.9 cm exophytic cyst off the midpole of the left kidney which appears benign. No follow-up imaging recommended. 2 mm nonobstructing stone in the midpole of the left kidney. No ureteral stones or hydronephrosis. Urinary  bladder unremarkable. Stomach/Bowel: Left colonic diverticulosis. No active diverticulitis. Normal appendix. Stomach and small bowel decompressed, unremarkable. Vascular/Lymphatic: Aortic atherosclerosis. No evidence of aneurysm or adenopathy. Reproductive: Lower pelvic structures obscured by beam hardening artifact from bilateral hip replacements. Other: No free fluid or free air. Musculoskeletal: Bilateral hip replacements. Degenerative changes in the lumbar spine. No acute bony abnormality. IMPRESSION: Hepatic steatosis. Punctate left midpole nephrolithiasis.  No hydronephrosis. Left colonic diverticulosis.  No active diverticulitis. Trace bilateral pleural effusions, bibasilar atelectasis. Ground-glass airspace opacity partially visualized on the 1st image in the left lower lobe. This could reflect developing infiltrate/pneumonia. Electronically Signed   By: Charlett Nose M.D.   On: 02/12/2023 21:21   PROCEDURES: Critical Care performed: No .1-3 Lead EKG Interpretation  Performed by: Merwyn Katos, MD Authorized by: Merwyn Katos, MD     Interpretation: normal     ECG rate:  71   ECG rate assessment: normal     Rhythm: sinus rhythm     Ectopy: none     Conduction: normal    MEDICATIONS ORDERED IN  ED: Medications  acetaminophen (TYLENOL) tablet 650 mg ( Oral Canceled Entry 02/12/23 1944)  ondansetron (ZOFRAN) injection 4 mg (4 mg Intravenous Given 02/12/23 1540)  sodium chloride 0.9 % bolus 1,000 mL (0 mLs Intravenous Stopped 02/12/23 2144)  iohexol (OMNIPAQUE) 300 MG/ML solution 100 mL (100 mLs Intravenous Contrast Given 02/12/23 2111)  azithromycin (ZITHROMAX) tablet 500 mg (500 mg Oral Given 02/12/23 2244)   IMPRESSION / MDM / ASSESSMENT AND PLAN / ED COURSE  I reviewed the triage vital signs and the nursing notes.                             The patient is on the cardiac monitor to evaluate for evidence of arrhythmia and/or significant heart rate changes. Patient's presentation is  most consistent with acute presentation with potential threat to life or bodily function. Patient presents for acute nausea/vomiting The cause of the patients symptoms is not clear, but the patient is overall well appearing and is suspected to have a transient course of illness.  Given History and Exam there does not appear to be an emergent cause of the symptoms such as small bowel obstruction, coronary syndrome, bowel ischemia, DKA, pancreatitis, appendicitis, other acute abdomen or other emergent problem.  Reassessment: After treatment, the patient is feeling much better, tolerating PO fluids, and shows no signs of dehydration.   Disposition: Discharge home with prompt primary care physician follow up in the next 48 hours. Strict return precautions discussed.   FINAL CLINICAL IMPRESSION(S) / ED DIAGNOSES   Final diagnoses:  Dehydration  Nausea and vomiting, unspecified vomiting type  Acute cough   Rx / DC Orders   ED Discharge Orders          Ordered    azithromycin (ZITHROMAX Z-PAK) 250 MG tablet        02/12/23 2229    benzonatate (TESSALON PERLES) 100 MG capsule  3 times daily PRN        02/12/23 2241           Note:  This document was prepared using Dragon voice recognition software and may include unintentional dictation errors.   Merwyn Katos, MD 02/12/23 [phone]

## 2023-02-12 NOTE — ED Provider Triage Note (Signed)
Emergency Medicine Provider Triage Evaluation Note  Dennis Steele , a 72 y.o. male  was evaluated in triage.  Pt complains of fever, v/d, patient in subwait, wife concerned about fever and how he is acting.  Review of Systems  Positive:  Negative:   Physical Exam  BP 135/67 (BP Location: Right Arm)   Pulse 74   Temp 100.3 F (37.9 C) (Oral)   Resp 16   Ht 6' (1.829 m)   Wt 90.7 kg   SpO2 98%   BMI 27.12 kg/m  Gen:   Awake, no distress   Resp:  Normal effort  MSK:   Moves extremities without difficulty  Other:  Patien is alert and can answer questions  Medical Decision Making  Medically screening exam initiated at 6:30 PM.  Appropriate orders placed.  Dennis Steele was informed that the remainder of the evaluation will be completed by another provider, this initial triage assessment does not replace that evaluation, and the importance of remaining in the ED until their evaluation is complete.  Due to fever will add lactic acid, gave reassurance that he is scheduled for next bed when available   Faythe Ghee, PA-C 02/12/23 1832

## 2023-02-15 ENCOUNTER — Telehealth: Payer: Self-pay

## 2023-02-15 NOTE — Telephone Encounter (Signed)
Transition Care Management Follow-up Telephone Call Date of discharge and from where: [name] 7/21 How have you been since you were released from the hospital? Doing ok but temp is still fluctuating  Any questions or concerns? No  Items Reviewed: Did the pt receive and understand the discharge instructions provided? Yes  Medications obtained and verified? Yes  Other? No  Any new allergies since your discharge? No  Dietary orders reviewed? No Do you have support at home? Yes    Follow up appointments reviewed:  PCP Hospital f/u appt confirmed? Yes  Scheduled to see PCP on 7/24 @ . Specialist Hospital f/u appt confirmed? No  Scheduled to see  on  @ . Are transportation arrangements needed? No  If their condition worsens, is the pt aware to call PCP or go to the Emergency Dept.? Yes Was the patient provided with contact information for the PCP's office or ED? Yes Was to pt encouraged to call back with questions or concerns? Yes

## 2023-02-16 ENCOUNTER — Ambulatory Visit (INDEPENDENT_AMBULATORY_CARE_PROVIDER_SITE_OTHER): Payer: Medicare Other | Admitting: Family Medicine

## 2023-02-16 VITALS — BP 129/81 | HR 74 | Temp 97.9°F | Ht 72.0 in | Wt 209.0 lb

## 2023-02-16 DIAGNOSIS — R7989 Other specified abnormal findings of blood chemistry: Secondary | ICD-10-CM | POA: Diagnosis not present

## 2023-02-16 DIAGNOSIS — J189 Pneumonia, unspecified organism: Secondary | ICD-10-CM | POA: Diagnosis not present

## 2023-02-16 DIAGNOSIS — R051 Acute cough: Secondary | ICD-10-CM | POA: Diagnosis not present

## 2023-02-16 MED ORDER — BENZONATATE 100 MG PO CAPS
100.0000 mg | ORAL_CAPSULE | Freq: Three times a day (TID) | ORAL | 1 refills | Status: DC | PRN
Start: 2023-02-16 — End: 2023-06-13

## 2023-02-16 MED ORDER — AMOXICILLIN 500 MG PO CAPS
500.0000 mg | ORAL_CAPSULE | Freq: Three times a day (TID) | ORAL | 0 refills | Status: AC
Start: 2023-02-16 — End: 2023-02-26

## 2023-02-16 MED ORDER — HYDROCOD POLI-CHLORPHE POLI ER 10-8 MG/5ML PO SUER
5.0000 mL | Freq: Two times a day (BID) | ORAL | 0 refills | Status: DC | PRN
Start: 2023-02-16 — End: 2023-06-13

## 2023-02-16 NOTE — Progress Notes (Signed)
Established patient visit   Patient: Dennis Steele   DOB: [birth year]   72 y.o. Male  MRN: [id number] Visit Date: 02/16/2023  Today's healthcare provider: Sherlyn Hay, DO   Chief Complaint  Patient presents with   ER follow up   Subjective    HPI HPI   Patient was seen at [facility] ED on 02/12/23 with positive Covid, elevated temp to 103 and cough. Last edited by Adline Peals, CMA on 02/16/2023  1:47 PM.      From ER visit/record: "fever, vomiting, and dry cough over the last 2 days after a visit with his son who was positive for COVID. Patient states that he took a COVID test at home that was 'possibly positive.' Patient was in no acute distress however did have an unsteady gait and was placed in a wheelchair in triage. Patient had several episodes of nonbloody, nonbilious emesis in triage as well."  - Discharged on azithromycin and benzonatate 02/12/23  - ECG NSR 71 without ectopy  - CT abd/pelvis w/contrast - Hepatic steatosis. Punctate left midpole nephrolithiasis. No hydronephrosis. Left colonic diverticulosis. No active diverticulitis. Trace bilateral pleural effusions, bibasilar atelectasis. Ground-glass airspace opacity partially visualized on the 1st image in the left lower lobe. This could reflect developing infiltrate/pneumonia.   - CXR negative  - (+) fever in ER 100.7  - Covid "maybe positive" at home and negative in the ER ______________________________________  Today, patient endorses:  - He remains weak and tired, with a hacking nonproductive cough  - Temp still fluctuating (97.8-101, but most temperature checks are <100).         - Lots of 99.7 when checking - taking tylenol for it  - Started z-pak Sunday, still taking it (has one day left)  - Doesn't feel hot, gets really clammy Prior to ER visit:  - Took Paxlovid Friday, got worse Saturday  - 2 episodes vomiting on Saturday; otherwise he was not vomiting   Goes to [facility] for  annual physicals  Per patient and his wife, at baseline, he "sweats like a racehorse."    - BP meds changed previously, which somewhat improved his sweating    - Can walk dog four blocks up the street and will be extremely sweaty    - Plays golf two times per week; at baseline, he has to use a cart because he becomes very sweaty and very fatigued    Medications: Outpatient Medications Prior to Visit  Medication Sig   [EXPIRED] azithromycin (ZITHROMAX Z-PAK) 250 MG tablet Take 2 tablets (500 mg) on  Day 1,  followed by 1 tablet (250 mg) once daily on Days 2 through 5.   fluticasone (FLONASE) 50 MCG/ACT nasal spray Place 2 sprays into both nostrils daily.   ketorolac (ACULAR) 0.4 % SOLN INSTILL 1 DROP IN RIGHT EYE FOUR TIMES DAILY   latanoprost (XALATAN) 0.005 % ophthalmic solution Place 1 drop into the left eye at bedtime.   meloxicam (MOBIC) 7.5 MG tablet Take 1 tablet (7.5 mg total) by mouth 2 (two) times daily as needed. for pain   MULTIPLE VITAMIN PO Take 1 tablet by mouth daily.   mupirocin ointment (BACTROBAN) 2 % Apply 1 Application topically 2 (two) times daily. Apply topically to wound until healed.   rosuvastatin (CRESTOR) 10 MG tablet Take 10 mg by mouth daily.   sildenafil (VIAGRA) 100 MG tablet Take 1 tablet (100 mg total) by mouth as needed.   timolol (  TIMOPTIC) 0.5 % ophthalmic solution Place 1 drop into the right eye every morning.   triamterene-hydrochlorothiazide (DYAZIDE) 37.5-25 MG capsule Take 1 each (1 capsule total) by mouth every morning.   Turmeric (QC TUMERIC COMPLEX PO) Take by mouth.   valACYclovir (VALTREX) 1000 MG tablet Take 1,000 mg by mouth as needed. Only for outbreaks of fever blisters   [DISCONTINUED] benzonatate (TESSALON PERLES) 100 MG capsule Take 1 capsule (100 mg total) by mouth 3 (three) times daily as needed for cough.   [DISCONTINUED] nirmatrelvir/ritonavir (PAXLOVID) 20 x 150 MG & 10 x 100MG  TABS Take 3 tablets by mouth 2 (two) times daily for 5  days. (Take nirmatrelvir 150 mg two tablets twice daily for 5 days and ritonavir 100 mg one tablet twice daily for 5 days) Patient GFR is 79. STOP ROSUVASTATIN FOR 10 DAYS   [DISCONTINUED] prednisoLONE acetate (PRED FORTE) 1 % ophthalmic suspension SHAKE LIQUID AND INSTILL 1 DROP IN RIGHT EYE FOUR TIMES DAILY   No facility-administered medications prior to visit.    Review of Systems  Constitutional:  Positive for chills, fatigue and fever. Negative for appetite change and unexpected weight change.  Eyes:  Negative for visual disturbance.  Respiratory:  Negative for chest tightness, shortness of breath and wheezing.   Cardiovascular:  Negative for chest pain and palpitations.  Gastrointestinal:  Negative for abdominal pain, nausea and vomiting.  Skin:  Negative for color change and rash.         Objective    BP 129/81 (BP Location: Left Arm, Patient Position: Sitting, Cuff Size: Normal)   Pulse 74   Temp 97.9 F (36.6 C) (Oral)   Ht 6' (1.829 m)   Wt 209 lb (94.8 kg)   SpO2 96%   BMI 28.35 kg/m      Physical Exam Vitals reviewed.  Constitutional:      General: He is not in acute distress.    Appearance: Normal appearance. He is not diaphoretic.  HENT:     Head: Normocephalic and atraumatic.  Eyes:     General: No scleral icterus.    Conjunctiva/sclera: Conjunctivae normal.  Cardiovascular:     Rate and Rhythm: Normal rate and regular rhythm.     Pulses: Normal pulses.     Heart sounds: Normal heart sounds. No murmur heard. Pulmonary:     Effort: Pulmonary effort is normal. No respiratory distress.     Breath sounds: Rhonchi (very mildly coarse at bases) present. No wheezing.  Musculoskeletal:     Cervical back: Neck supple.     Right lower leg: No edema.     Left lower leg: No edema.  Lymphadenopathy:     Cervical: No cervical adenopathy.  Skin:    General: Skin is warm and dry.     Findings: No rash.  Neurological:     Mental Status: He is alert and  oriented to person, place, and time. Mental status is at baseline.  Psychiatric:        Mood and Affect: Mood normal.        Behavior: Behavior normal.      No results found for any visits on 02/16/23.  Assessment & Plan    1. Community acquired pneumonia of both lower lobes Discussed likely scenarios and treatment options with patient and his wife.  Discussed that temperatures are not typically considered a fever unless they are 100.4 or higher, as they were concerned with consistent levels of 99.  However, did acknowledge that sometimes older people  are less likely to develop true fevers due to a reduced immune response.  Recommended patient and his wife keep track of his temperatures for a week or 2 after he has fully recovered to establish a baseline range for future reference.       Based on patient's persistent symptoms despite the fact that he is almost done with azithromycin, will go ahead and prescribe amoxicillin for complete coverage of community-acquired pneumonia.       Also recommended he be sure to rest, drink plenty of fluids and ensure he is getting plenty of calories while recovering.  Recommended he not golf until he is feeling significantly better.  Advised him that, if he tries to golf because he feels like he has better, but becomes significantly fatigued, he should stop for the day and rest instead in order to allow him a complete recovery. - amoxicillin (AMOXIL) 500 MG capsule; Take 1 capsule (500 mg total) by mouth 3 (three) times daily for 10 days.  Dispense: 30 capsule; Refill: 0 - chlorpheniramine-HYDROcodone (TUSSIONEX) 10-8 MG/5ML; Take 5 mLs by mouth every 12 (twelve) hours as needed for cough.  Dispense: 70 mL; Refill: 0  2. Acute cough Will also prescribe Tussionex to address patient's incessant coughing.  Advised him not to take other cough medicine while taking it, except for the benzonatate, which I have refilled as well. - benzonatate (TESSALON PERLES) 100 MG  capsule; Take 1 capsule (100 mg total) by mouth 3 (three) times daily as needed for cough.  Dispense: 30 capsule; Refill: 1 - chlorpheniramine-HYDROcodone (TUSSIONEX) 10-8 MG/5ML; Take 5 mLs by mouth every 12 (twelve) hours as needed for cough.  Dispense: 70 mL; Refill: 0  3. Elevated troponin level not due myocardial infarction Patient had a mildly elevated troponin in the ER, which is likely due to the severity of his upper respiratory infection at the time of presentation; however, patient and his wife are requesting a referral to cardiology.  Sent as noted below - Ambulatory referral to Cardiology   Return if symptoms worsen or fail to improve.      I discussed the assessment and treatment plan with the patient  The patient was provided an opportunity to ask questions and all were answered. The patient agreed with the plan and demonstrated an understanding of the instructions.   The patient was advised to call back or seek an in-person evaluation if the symptoms worsen or if the condition fails to improve as anticipated.  Total time was 40 minutes. That includes chart review before the visit, the actual patient visit, and time spent on documentation after the visit.    Sherlyn Hay, DO  [facility] [phone] (phone) [phone] (fax)  [facility]

## 2023-02-23 ENCOUNTER — Encounter: Payer: Self-pay | Admitting: Family Medicine

## 2023-02-23 ENCOUNTER — Ambulatory Visit: Payer: Self-pay | Admitting: *Deleted

## 2023-02-23 DIAGNOSIS — B3749 Other urogenital candidiasis: Secondary | ICD-10-CM

## 2023-02-23 NOTE — Telephone Encounter (Signed)
Message from [place] T sent at 02/23/2023  2:20 PM EDT  Summary: medication   Patient called stated he was prescribed an antibiotic last Tuesday and he now has a yeast infection on his penis. He is requesting something be called in that he can take by mouth. Please f/u with paitent          Call History  Contact Date/Time Type Contact Phone/Fax User  02/23/2023 02:16 PM EDT Phone (Incoming) Dennis Steele, Dennis Steele (Self) [phone] Judie Petit) Elon Jester   Reason for Disposition  All other penis - scrotum symptoms  (Exception: Painless rash < 24 hours duration.)    Yeast infection from taking an antibiotic for pneumonia  Answer Assessment - Initial Assessment Questions 1. SYMPTOM: "What's the main symptom you're concerned about?" (e.g., discharge from penis, rash, pain, itching, swelling)     The antibiotics have given me a yeast infection on my penis.    It's white and I have redness on my penis.   I'm not circumcised so that's not helping the situation right now.    It's burning.    I put some of my wife's Monostat on my penis to see if it would help because sometimes it does.   This time I've put it on there 3 days and it has not helped.    2. LOCATION: "Where is the yeast located?"     My penis.   I get a yeast infection every time I'm on this type of antibiotic.   I was diagnosed with walking pneumonia. 3. ONSET: "When did yeast   start?"     Not long after starting the antibiotic for the pneumonia. 4. PAIN: "Is there any pain?" If Yes, ask: "How bad is it?"  (Scale 1-10; or mild, moderate, severe)     It burns 5. URINE: "Any difficulty passing urine?" If Yes, ask: "When was the last time?"     No 6. CAUSE: "What do you think is causing the symptoms?"     Yeast infection from the antibiotic.   I get a yeast infection on my penis every time I'm on this type of antibiotic. 7. OTHER SYMPTOMS: "Do you have any other symptoms?" (e.g., fever, abdomen pain, blood in urine)     Let Dr. Payton Mccallum  know I'm feeling much better now.   I'm planning on playing golf tomorrow but I'm going to take it easy.  Protocols used: Penis and Scrotum Symptoms-A-AH

## 2023-02-23 NOTE — Telephone Encounter (Signed)
  Chief Complaint: Yeast infection on penis from taking an antibiotic  Amoxicillin 500 mg for pneumonia.    He gets this every time he is on this type of antibiotic. Symptoms: white discharge, redness and burning.    Frequency: Since being on the antibiotic Pertinent Negatives: Patient denies itching.   Tried Monostat for 3 days that his wife had but it did not help.   Sometimes it does but has not this time. Disposition: [] ED /[] Urgent Care (no appt availability in office) / [] Appointment(In office/virtual)/ []  [place] [facility]/ [] Home Care/ [] Refused Recommended Disposition /[] [facility]/ [x]  Follow-up with PCP Additional Notes: I'm sending a message to Dr. Payton Mccallum.  If she is willing to call in something please send to [facility] on [facility].

## 2023-02-24 ENCOUNTER — Encounter: Payer: Self-pay | Admitting: Family Medicine

## 2023-02-24 MED ORDER — FLUCONAZOLE 200 MG PO TABS
200.0000 mg | ORAL_TABLET | Freq: Every day | ORAL | 0 refills | Status: DC
Start: 2023-02-24 — End: 2023-06-13

## 2023-02-24 NOTE — Addendum Note (Signed)
Addended by: Jacquenette Shone on: 02/24/2023 12:01 PM   Modules accepted: Orders

## 2023-02-24 NOTE — Addendum Note (Signed)
Addended by: Jacquenette Shone on: 02/24/2023 09:45 PM   Modules accepted: Orders

## 2023-03-08 ENCOUNTER — Other Ambulatory Visit: Payer: Self-pay | Admitting: Family Medicine

## 2023-03-09 DIAGNOSIS — M62830 Muscle spasm of back: Secondary | ICD-10-CM | POA: Diagnosis not present

## 2023-03-09 DIAGNOSIS — M5136 Other intervertebral disc degeneration, lumbar region: Secondary | ICD-10-CM | POA: Diagnosis not present

## 2023-03-09 DIAGNOSIS — M9902 Segmental and somatic dysfunction of thoracic region: Secondary | ICD-10-CM | POA: Diagnosis not present

## 2023-03-09 DIAGNOSIS — M9903 Segmental and somatic dysfunction of lumbar region: Secondary | ICD-10-CM | POA: Diagnosis not present

## 2023-03-21 ENCOUNTER — Telehealth: Payer: Self-pay | Admitting: Neurosurgery

## 2023-03-21 DIAGNOSIS — M5416 Radiculopathy, lumbar region: Secondary | ICD-10-CM

## 2023-03-21 NOTE — Telephone Encounter (Signed)
Dr Leda Min does epidural steroid injections at [facility] in [facility] if that is what he is looking for.

## 2023-03-21 NOTE — Telephone Encounter (Signed)
Patient is calling that he has had injections with Dr.Chasnis and they did not give him any relief. He had an injection in 2023 at [facility] and it worked great.  Can you refer him back to [facility], the referral can be placed for Dr.Trevor Noralyn Pick. Patient is trying to avoid surgery. Fax number [phone].

## 2023-03-22 NOTE — Telephone Encounter (Signed)
I spoke with Mr Botsch and I offered an appointment with Dr Myer Haff to discuss options, vs a referral to [facility] for injections (at his request). I informed him that Dr Hal Hope is also a Careers adviser. He states he would like a referral to [facility] for an injection. Referral has been placed.

## 2023-03-24 NOTE — Telephone Encounter (Signed)
Patient calling back. There is a scheduling conflict with [facility]. He would like a referral to [facility].

## 2023-03-24 NOTE — Telephone Encounter (Signed)
OK to refer to Dr Cherylann Ratel per discussion w/ Dr Myer Haff. Referral placed.

## 2023-03-24 NOTE — Addendum Note (Signed)
Addended by: Sharlot Gowda on: 03/24/2023 02:15 PM   Modules accepted: Orders

## 2023-04-08 DIAGNOSIS — M5416 Radiculopathy, lumbar region: Secondary | ICD-10-CM | POA: Diagnosis not present

## 2023-05-02 DIAGNOSIS — R7989 Other specified abnormal findings of blood chemistry: Secondary | ICD-10-CM | POA: Insufficient documentation

## 2023-05-02 NOTE — Progress Notes (Unsigned)
Cardiology Office Note  Date:  05/03/2023   ID:  Dennis Steele, DOB [birth year], MRN [id number]  PCP:  Dennis Hay, DO   Chief Complaint  Patient presents with   Shortness of Breath    Shortness of breath with minor exertion    HPI:  Dennis Steele is a 72 year old gentleman with past medical history of Mild aortic atherosclerosis on ultrasound February 2023 and CT scan July 2024 Hypertension Prostate cancer 11 years ago Hip replacements Referred by Dr. Jacquenette Steele for elevated troponin and shortness of breath  Seen in the emergency room February 12, 2019 for for fever, vomiting, dry cough, COVID-negative Troponin in the ER 19, 24 Sodium 131 CT scan chest with groundglass airspace opacity left lower lobe Treated with Z-Pak, Tessalon Perles Was told that he had likely "Walking PNA"  At baseline reports he is active Plays golf, Though does report new symptoms of feeling tired for 1 year Sweating, particularly at nighttime when sleeping and sometimes with exertion  less energy No regular exercise in one year  Last week on Wenesday, 04/27/23,  walked 300 yards from his house to the [place]  Hit some balls, Difficulty getting back home secondary to giving out, fatigue, sweating  When he got home he had to sit to recover  Prior testing reviewed Stress test at [facility] May 2023  Prior lab work reviewed Total cholesterol 156 LDL 78  EKG personally reviewed by myself on todays visit EKG Interpretation Date/Time:  Tuesday May 03 2023 11:14:40 EDT Ventricular Rate:  74 PR Interval:  172 QRS Duration:  82 QT Interval:  396 QTC Calculation: 439 R Axis:   16  Text Interpretation: Sinus rhythm with occasional Premature ventricular complexes When compared with ECG of 12-Feb-2023 15:41, Premature ventricular complexes are now Present Confirmed by Dennis Steele [phone]) on 05/03/2023 11:18:18 AM    PMH:   has a past medical history of Actinic keratosis, Basal cell carcinoma  (03/29/2013), Basal cell carcinoma (08/05/2016), Dysplastic nevus (07/31/2015), GERD (gastroesophageal reflux disease), Hypertension, OAG (open angle glaucoma), Prostate cancer (HCC), and Squamous cell carcinoma of skin (02/27/2009).  PSH:    Past Surgical History:  Procedure Laterality Date   ANTERIOR CRUCIATE LIGAMENT REPAIR  25 yrs ago   not sure which side   COLONOSCOPY WITH PROPOFOL N/A 10/04/2016   Procedure: COLONOSCOPY WITH PROPOFOL;  Surgeon: Dennis Jun, MD;  Location[facility] ENDOSCOPY;  Service: Endoscopy;  Laterality: N/A;   ESOPHAGOGASTRODUODENOSCOPY (EGD) WITH PROPOFOL N/A 10/04/2016   Procedure: ESOPHAGOGASTRODUODENOSCOPY (EGD) WITH PROPOFOL;  Surgeon: Dennis Jun, MD;  Location: [facility] ENDOSCOPY;  Service: Endoscopy;  Laterality: N/A;   EYE SURGERY  04/2015   lens surgery from a dislodged les from cataract surgery   LYMPHADENECTOMY  06/19/2012   Procedure: LYMPHADENECTOMY;  Surgeon: Dennis Mc, MD;  Location: [facility] ORS;  Service: Urology;  Laterality: Bilateral;   ROBOT ASSISTED LAPAROSCOPIC RADICAL PROSTATECTOMY  06/19/2012   Procedure: ROBOTIC ASSISTED LAPAROSCOPIC RADICAL PROSTATECTOMY LEVEL 2;  Surgeon: Dennis Mc, MD;  Location: [facility] ORS;  Service: Urology;  Laterality: N/A;   TOTAL HIP ARTHROPLASTY Right 2015    Current Outpatient Medications  Medication Sig Dispense Refill   latanoprost (XALATAN) 0.005 % ophthalmic solution Place 1 drop into the left eye at bedtime.     meloxicam (MOBIC) 7.5 MG tablet TAKE 1 TABLET(7.5 MG) BY MOUTH TWICE DAILY AS NEEDED FOR PAIN 60 tablet 5   metoprolol tartrate (LOPRESSOR) 100 MG tablet Take 1 tablet (100 mg total)  by mouth once for 1 dose. 1 tablet 0   MULTIPLE VITAMIN PO Take 1 tablet by mouth daily.     rosuvastatin (CRESTOR) 10 MG tablet Take 10 mg by mouth daily.     sildenafil (VIAGRA) 100 MG tablet Take 1 tablet (100 mg total) by mouth as needed. 10 tablet 1   timolol (TIMOPTIC) 0.5 % ophthalmic solution Place 1 drop into the  right eye every morning.     triamterene-hydrochlorothiazide (DYAZIDE) 37.5-25 MG capsule Take 1 each (1 capsule total) by mouth every morning. 90 capsule 3   Turmeric (QC TUMERIC COMPLEX PO) Take by mouth.     valACYclovir (VALTREX) 1000 MG tablet Take 1,000 mg by mouth as needed. Only for outbreaks of fever blisters     benzonatate (TESSALON PERLES) 100 MG capsule Take 1 capsule (100 mg total) by mouth 3 (three) times daily as needed for cough. (Patient not taking: Reported on 05/03/2023) 30 capsule 1   chlorpheniramine-HYDROcodone (TUSSIONEX) 10-8 MG/5ML Take 5 mLs by mouth every 12 (twelve) hours as needed for cough. (Patient not taking: Reported on 05/03/2023) 70 mL 0   fluconazole (DIFLUCAN) 200 MG tablet Take 1 tablet (200 mg total) by mouth daily. (Patient not taking: Reported on 05/03/2023) 14 tablet 0   fluticasone (FLONASE) 50 MCG/ACT nasal spray Place 2 sprays into both nostrils daily. (Patient not taking: Reported on 05/03/2023) 16 g 6   ketorolac (ACULAR) 0.4 % SOLN INSTILL 1 DROP IN RIGHT EYE FOUR TIMES DAILY (Patient not taking: Reported on 05/03/2023)     mupirocin ointment (BACTROBAN) 2 % Apply 1 Application topically 2 (two) times daily. Apply topically to wound until healed. (Patient not taking: Reported on 05/03/2023) 22 g 0   No current facility-administered medications for this visit.     Allergies:   Patient has no known allergies.   Social History:  The patient  reports that he has quit smoking. His smoking use included cigars. He has never used smokeless tobacco. He reports current alcohol use. He reports that he does not use drugs.   Family History:   family history includes Cancer in his brother, father, and mother; Diabetes in his mother; Heart disease in his mother; Stroke in his mother.    Review of Systems: Review of Systems  Constitutional: Negative.   HENT: Negative.    Respiratory: Negative.    Cardiovascular: Negative.   Gastrointestinal: Negative.    Musculoskeletal: Negative.   Neurological: Negative.   Psychiatric/Behavioral: Negative.    All other systems reviewed and are negative.   PHYSICAL EXAM: VS:  BP 110/60 (BP Location: Left Arm, Patient Position: Sitting, Cuff Size: Large)   Pulse 74   Ht 5\' 10"  (1.778 m)   Wt 211 lb 6.4 oz (95.9 kg)   SpO2 97%   BMI 30.33 kg/m  , BMI Body mass index is 30.33 kg/m. GEN: Well nourished, well developed, in no acute distress HEENT: normal Neck: no JVD, carotid bruits, or masses Cardiac: RRR; no murmurs, rubs, or gallops,no edema  Respiratory:  clear to auscultation bilaterally, normal work of breathing GI: soft, nontender, nondistended, + BS MS: no deformity or atrophy Skin: warm and dry, no rash Neuro:  Strength and sensation are intact Psych: euthymic mood, full affect   Recent Labs: 02/12/2023: ALT 21; BUN 21; Creatinine, Ser 1.02; Hemoglobin 12.5; Platelets 185; Potassium 3.7; Sodium 131    Lipid Panel Lab Results  Component Value Date   CHOL 152 02/03/2022   HDL 48 02/03/2022  LDLCALC 76 02/03/2022   TRIG 164 (H) 02/03/2022      Wt Readings from Last 3 Encounters:  05/03/23 211 lb 6.4 oz (95.9 kg)  02/16/23 209 lb (94.8 kg)  02/12/23 200 lb (90.7 kg)     ASSESSMENT AND PLAN:  Problem List Items Addressed This Visit       Cardiology Problems   BP (high blood pressure)   Relevant Medications   metoprolol tartrate (LOPRESSOR) 100 MG tablet   Hyperlipidemia   Relevant Medications   metoprolol tartrate (LOPRESSOR) 100 MG tablet     Other   Elevated troponin - Primary   Relevant Orders   EKG 12-Lead (Completed)   Other Visit Diagnoses     Precordial pain       Relevant Orders   CT CORONARY MORPH W/CTA COR W/SCORE W/CA W/CM &/OR WO/CM   Dyspnea, unspecified type       Relevant Orders   ECHOCARDIOGRAM COMPLETE   Medication management       Relevant Orders   Basic metabolic panel   Angina pectoris (HCC)       Relevant Medications   metoprolol  tartrate (LOPRESSOR) 100 MG tablet   Other Relevant Orders   CT CORONARY MORPH W/CTA COR W/SCORE W/CA W/CM &/OR WO/CM   ECHOCARDIOGRAM COMPLETE      Angina/fatigue, shortness of breath, sweating Symptoms concerning for ischemia Discussed various treatment options available Cardiac CTA ordered to rule out ischemia Echocardiogram ordered for shortness of breath  Aortic atherosclerosis Mild disease noted on prior CT imaging On statin  Essential hypertension Blood pressure is well controlled on today's visit. No changes made to the medications.     Total encounter time more than 50 minutes  Greater than 50% was spent in counseling and coordination of care with the patient    Signed, Dossie Arbour, M.D., Ph.D. [facility] [place], [place] [phone]

## 2023-05-03 ENCOUNTER — Encounter: Payer: Self-pay | Admitting: Cardiovascular Disease

## 2023-05-03 ENCOUNTER — Ambulatory Visit: Payer: Medicare Other | Attending: Cardiovascular Disease | Admitting: Cardiovascular Disease

## 2023-05-03 VITALS — BP 110/60 | HR 74 | Ht 70.0 in | Wt 211.4 lb

## 2023-05-03 DIAGNOSIS — E782 Mixed hyperlipidemia: Secondary | ICD-10-CM | POA: Diagnosis not present

## 2023-05-03 DIAGNOSIS — Z79899 Other long term (current) drug therapy: Secondary | ICD-10-CM | POA: Diagnosis not present

## 2023-05-03 DIAGNOSIS — I10 Essential (primary) hypertension: Secondary | ICD-10-CM | POA: Diagnosis not present

## 2023-05-03 DIAGNOSIS — R0600 Dyspnea, unspecified: Secondary | ICD-10-CM | POA: Diagnosis not present

## 2023-05-03 DIAGNOSIS — R072 Precordial pain: Secondary | ICD-10-CM

## 2023-05-03 DIAGNOSIS — I209 Angina pectoris, unspecified: Secondary | ICD-10-CM

## 2023-05-03 DIAGNOSIS — R7989 Other specified abnormal findings of blood chemistry: Secondary | ICD-10-CM | POA: Diagnosis not present

## 2023-05-03 MED ORDER — METOPROLOL TARTRATE 100 MG PO TABS: 100.0000 mg | ORAL_TABLET | Freq: Once | ORAL | 0 refills | Status: DC

## 2023-05-03 NOTE — Patient Instructions (Addendum)
Medication Instructions:  No changes  If you need a refill on your cardiac medications before your next appointment, please call your pharmacy.   Lab work: Your provider would like for you to have following labs drawn today BMP.    Testing/Procedures: Your physician has requested that you have an echocardiogram. Echocardiography is a painless test that uses sound waves to create images of your heart. It provides your doctor with information about the size and shape of your heart and how well your heart's chambers and valves are working.   You may receive an ultrasound enhancing agent through an IV if needed to better visualize your heart during the echo. This procedure takes approximately one hour.  There are no restrictions for this procedure.  This will take place at [address] [address], [place] [address]    Your cardiac CT will be scheduled at one of the below locations:    [facility] [address] [place], [place] [address] [phone]  OR   [facility] [address] [place], [place] [address] [phone]   If scheduled at [facility] or [facility], please arrive 15 mins early for check-in and test prep.  There is spacious parking and easy access to the radiology department from the [facility] entrance. Please enter here and check-in with the desk attendant.   Please follow these instructions carefully (unless otherwise directed):  An IV will be required for this test and Nitroglycerin will be given.  Hold all erectile dysfunction medications at least 3 days (72 hrs) prior to test. (Ie viagra, cialis, sildenafil, tadalafil, etc)   On the Night Before the Test: Be sure to Drink plenty of water. Do not consume any caffeinated/decaffeinated beverages or chocolate 12 hours prior to your test. Do not take any  antihistamines 12 hours prior to your test.  On the Day of the Test: Drink plenty of water until 1 hour prior to the test. Do not eat any food 1 hour prior to test. You may take your regular medications prior to the test except Trimterene-Hydrochlorothiazide ( Dyazide) 37.5 - 25 MG. Take metoprolol (Lopressor) 100 MG two hours prior to test. If you take Furosemide/Hydrochlorothiazide/Spironolactone, please HOLD on the morning of the test.      After the Test: Drink plenty of water. After receiving IV contrast, you may experience a mild flushed feeling. This is normal. On occasion, you may experience a mild rash up to 24 hours after the test. This is not dangerous. If this occurs, you can take Benadryl 25 mg and increase your fluid intake. If you experience trouble breathing, this can be serious. If it is severe call 911 IMMEDIATELY. If it is mild, please call our office. If you take any of these medications: Glipizide/Metformin, Avandament, Glucavance, please do not take 48 hours after completing test unless otherwise instructed.  We will call to schedule your test 2-4 weeks out understanding that some insurance companies will need an authorization prior to the service being performed.   For more information and frequently asked questions, please visit our website : [email or web address]  For non-scheduling related questions, please contact the cardiac imaging nurse navigator should you have any questions/concerns: Cardiac Imaging Nurse Navigators Direct Office Dial: [phone]   For scheduling needs, including cancellations and rescheduling, please call [place], [phone].   Follow-Up: At [facility], you and your health needs are our priority.  As  part of our continuing mission to provide you with exceptional heart care, we have created designated Provider Care Teams.  These Care Teams include your primary Cardiologist (physician) and Advanced Practice Providers  (APPs -  Physician Assistants and Nurse Practitioners) who all work together to provide you with the care you need, when you need it.  You will need a follow up appointment as needed  Providers on your designated Care Team:   Nicolasa Ducking, NP Eula Listen, PA-C Cadence Fransico Michael, [place]  COVID-19 Vaccine Information can be found at: [email or web address] For questions related to vaccine distribution or appointments, please email [email or web address] or call [phone].

## 2023-05-04 ENCOUNTER — Other Ambulatory Visit: Payer: Self-pay | Admitting: Cardiovascular Disease

## 2023-05-04 LAB — BASIC METABOLIC PANEL
BUN/Creatinine Ratio: 20 (ref 10–24)
BUN: 20 mg/dL (ref 8–27)
CO2: 26 mmol/L (ref 20–29)
Calcium: 9.8 mg/dL (ref 8.6–10.2)
Chloride: 99 mmol/L (ref 96–106)
Creatinine, Ser: 1.01 mg/dL (ref 0.76–1.27)
Glucose: 84 mg/dL (ref 70–99)
Potassium: 4.4 mmol/L (ref 3.5–5.2)
Sodium: 140 mmol/L (ref 134–144)
eGFR: 78 mL/min/{1.73_m2} (ref >59)

## 2023-05-05 ENCOUNTER — Encounter (HOSPITAL_COMMUNITY): Payer: Self-pay

## 2023-05-06 ENCOUNTER — Telehealth (HOSPITAL_COMMUNITY): Payer: Self-pay | Admitting: Emergency Medicine

## 2023-05-06 NOTE — Telephone Encounter (Signed)
Reaching out to patient to offer assistance regarding upcoming cardiac imaging study; pt verbalizes understanding of appt date/time, parking situation and where to check in, pre-test NPO status and medications ordered, and verified current allergies; name and call back number provided for further questions should they arise Cayne Yom RN Navigator Cardiac Imaging [facility] [phone] office [phone] cell 

## 2023-05-09 ENCOUNTER — Ambulatory Visit
Admission: RE | Admit: 2023-05-09 | Discharge: 2023-05-09 | Disposition: A | Discharge status: Home / Self Care | Payer: Medicare Other | Source: Ambulatory Visit | Attending: Cardiovascular Disease | Admitting: Cardiovascular Disease

## 2023-05-09 DIAGNOSIS — I209 Angina pectoris, unspecified: Secondary | ICD-10-CM | POA: Diagnosis not present

## 2023-05-09 DIAGNOSIS — R072 Precordial pain: Secondary | ICD-10-CM | POA: Insufficient documentation

## 2023-05-09 MED ORDER — IOHEXOL 350 MG/ML SOLN
80.0000 mL | Freq: Once | INTRAVENOUS | Status: AC | PRN
  Administered 2023-05-09: 80 mL via INTRAVENOUS

## 2023-05-09 MED ORDER — NITROGLYCERIN 0.4 MG SL SUBL
SUBLINGUAL_TABLET | SUBLINGUAL | Status: AC
  Filled 2023-05-09: qty 2

## 2023-05-09 MED ORDER — NITROGLYCERIN 0.4 MG SL SUBL
0.8000 mg | SUBLINGUAL_TABLET | Freq: Once | SUBLINGUAL | Status: AC
  Administered 2023-05-09: 0.8 mg via SUBLINGUAL
  Filled 2023-05-09: qty 25

## 2023-05-19 ENCOUNTER — Ambulatory Visit (INDEPENDENT_AMBULATORY_CARE_PROVIDER_SITE_OTHER): Payer: Medicare Other | Admitting: Physician Assistant

## 2023-05-19 ENCOUNTER — Encounter: Payer: Self-pay | Admitting: Physician Assistant

## 2023-05-19 VITALS — BP 128/84 | HR 78 | Ht 70.0 in | Wt 210.0 lb

## 2023-05-19 DIAGNOSIS — Z23 Encounter for immunization: Secondary | ICD-10-CM | POA: Diagnosis not present

## 2023-05-19 DIAGNOSIS — R42 Dizziness and giddiness: Secondary | ICD-10-CM | POA: Diagnosis not present

## 2023-05-19 MED ORDER — MECLIZINE HCL 25 MG PO TABS: 25.0000 mg | ORAL_TABLET | Freq: Three times a day (TID) | ORAL | 0 refills | Status: DC | PRN

## 2023-05-19 NOTE — Progress Notes (Signed)
Established patient visit  Patient: Dennis Steele   DOB: [birth year]   72 y.o. Male  MRN: [id number] Visit Date: 05/19/2023  Today's healthcare provider: Debera Lat, PA-C   Chief Complaint  Patient presents with   Acute Visit   Subjective     HPI   Pt stated--dizziness, unstable, have dx vertigo, especially when walking around the corner or when standing--2 days Last edited by Shelly Bombard, CMA on 05/19/2023  1:03 PM.     Discussed the use of AI scribe software for clinical note transcription with the patient, who gave verbal consent to proceed.  History of Present Illness   The patient presents with a two-day history of dizziness, described as a feeling of unsteadiness, particularly when changing positions. The dizziness is not associated with nausea or vomiting, and is not as severe as previous episodes of vertigo they have experienced. The patient has been cautious to avoid rapid movements and has not experienced any falls. They have a history of high blood pressure and have been experiencing shortness of breath for several months, which is currently being investigated by a cardiologist. They also have a history of seasonal allergies, but have not experienced significant symptoms this year. The patient wears hearing aids, which are currently broken, but denies any earaches. They have been experiencing some post-nasal drainage.           05/19/2023    1:11 PM 02/16/2023    1:52 PM 02/01/2022    9:31 AM  Depression screen PHQ 2/9  Decreased Interest 0 0 0  Down, Depressed, Hopeless 0 0 0  PHQ - 2 Score 0 0 0  Altered sleeping 0 0 0  Tired, decreased energy 0 0 0  Change in appetite 0 0 0  Feeling bad or failure about yourself  0 0 0  Trouble concentrating 0 0 0  Moving slowly or fidgety/restless 0 0 0  Suicidal thoughts 0 0 0  PHQ-9 Score 0 0 0  Difficult doing work/chores Not difficult at all Not difficult at all Not difficult at all      05/19/2023    1:11 PM  GAD  7 : Generalized Anxiety Score  Nervous, Anxious, on Edge 0  Control/stop worrying 0  Worry too much - different things 0  Trouble relaxing 0  Restless 0  Easily annoyed or irritable 0  Afraid - awful might happen 0  Total GAD 7 Score 0  Anxiety Difficulty Not difficult at all    Medications: Outpatient Medications Prior to Visit  Medication Sig   latanoprost (XALATAN) 0.005 % ophthalmic solution Place 1 drop into the left eye at bedtime.   meloxicam (MOBIC) 7.5 MG tablet TAKE 1 TABLET(7.5 MG) BY MOUTH TWICE DAILY AS NEEDED FOR PAIN   MULTIPLE VITAMIN PO Take 1 tablet by mouth daily.   rosuvastatin (CRESTOR) 10 MG tablet Take 10 mg by mouth daily.   sildenafil (VIAGRA) 100 MG tablet Take 1 tablet (100 mg total) by mouth as needed.   timolol (TIMOPTIC) 0.5 % ophthalmic solution Place 1 drop into the right eye every morning.   triamterene-hydrochlorothiazide (DYAZIDE) 37.5-25 MG capsule Take 1 each (1 capsule total) by mouth every morning.   Turmeric (QC TUMERIC COMPLEX PO) Take by mouth.   valACYclovir (VALTREX) 1000 MG tablet Take 1,000 mg by mouth as needed. Only for outbreaks of fever blisters   benzonatate (TESSALON PERLES) 100 MG capsule Take 1 capsule (100 mg total) by mouth 3 (three) times daily as  needed for cough. (Patient not taking: Reported on 05/03/2023)   chlorpheniramine-HYDROcodone (TUSSIONEX) 10-8 MG/5ML Take 5 mLs by mouth every 12 (twelve) hours as needed for cough. (Patient not taking: Reported on 05/03/2023)   fluconazole (DIFLUCAN) 200 MG tablet Take 1 tablet (200 mg total) by mouth daily. (Patient not taking: Reported on 05/03/2023)   fluticasone (FLONASE) 50 MCG/ACT nasal spray Place 2 sprays into both nostrils daily. (Patient not taking: Reported on 05/03/2023)   ketorolac (ACULAR) 0.4 % SOLN INSTILL 1 DROP IN RIGHT EYE FOUR TIMES DAILY (Patient not taking: Reported on 05/03/2023)   metoprolol tartrate (LOPRESSOR) 100 MG tablet Take 1 tablet (100 mg total) by mouth once  for 1 dose.   mupirocin ointment (BACTROBAN) 2 % Apply 1 Application topically 2 (two) times daily. Apply topically to wound until healed. (Patient not taking: Reported on 05/03/2023)   No facility-administered medications prior to visit.    Review of Systems Except see HPI       Objective    BP 128/84 (BP Location: Left Arm, Patient Position: Supine, Cuff Size: Normal)   Pulse 78   Ht 5\' 10"  (1.778 m)   Wt 210 lb (95.3 kg)   BMI 30.13 kg/m     Physical Exam   No results found for any visits on 05/19/23.  Assessment & Plan        Vertigo New onset of dizziness, particularly with positional changes. No associated nausea or hearing changes. No history of stroke or heart disease. Normal neurological examination. Has hx of BPPV in the past. Possible benign paroxysmal positional vertigo (BPPV) or orthostatic hypotension, though orthostatic blood pressure changes were minimal. -Prescribe Meclizine, starting with half a tablet and increasing to up a tablet/max  up to three tablets daily as needed. Advised to drink enough water, consume healthy diet and exercise as tolerated. -Provide home exercises for vertigo. -Check blood work, last done in October.  Shortness of Breath Chronic, ongoing evaluation by Cardiology. Recent echocardiogram was normal. Scheduled for an ultrasound. -Continue workup with Cardiology.  General Health Maintenance -Administered influenza vaccine today.     No follow-ups on file.     The patient was advised to call back or seek an in-person evaluation if the symptoms worsen or if the condition fails to improve as anticipated.  I discussed the assessment and treatment plan with the patient. The patient was provided an opportunity to ask questions and all were answered. The patient agreed with the plan and demonstrated an understanding of the instructions.  I, Debera Lat, PA-C have reviewed all documentation for this visit. The documentation on   05/19/23 for the exam, diagnosis, procedures, and orders are all accurate and complete.  Debera Lat, [facility], MMS [facility] [phone] (phone) [phone] (fax)  [facility]

## 2023-05-23 ENCOUNTER — Ambulatory Visit: Payer: Medicare Other | Attending: Cardiovascular Disease

## 2023-05-23 DIAGNOSIS — I209 Angina pectoris, unspecified: Secondary | ICD-10-CM | POA: Diagnosis not present

## 2023-05-23 DIAGNOSIS — R0600 Dyspnea, unspecified: Secondary | ICD-10-CM | POA: Insufficient documentation

## 2023-05-24 ENCOUNTER — Encounter: Payer: Self-pay | Admitting: Emergency Medicine

## 2023-05-24 LAB — ECHOCARDIOGRAM COMPLETE
Area-P 1/2: 3.65 cm2
S' Lateral: 2.6 cm

## 2023-05-25 ENCOUNTER — Telehealth: Payer: Self-pay | Admitting: Cardiovascular Disease

## 2023-05-25 NOTE — Telephone Encounter (Signed)
Called patient, gave results:  Per Dr.Gollan:   Echocardiogram Normal left and right ventricular size and function No significant valvular heart disease

## 2023-05-25 NOTE — Telephone Encounter (Signed)
Pt is calling nurse back to get Echo results

## 2023-06-03 NOTE — Addendum Note (Signed)
Addended by: Shelly Bombard on: 06/03/2023 10:57 AM   Modules accepted: Orders

## 2023-06-12 NOTE — Progress Notes (Unsigned)
Cardiology Office Note  Date:  06/13/2023   ID:  Dennis Steele, DOB Nov 21, 1950, MRN [id number]  PCP:  Dennis Hay, DO   Chief Complaint  Patient presents with   [facility] up Cardiac testing.    "Doing well." Medications reviewed by the patient verbally.     HPI:  [name] is a 72 year old gentleman with past medical history of Mild aortic atherosclerosis on ultrasound February 2023 and CT scan July 2024 Hypertension Prostate cancer 11 years ago Hip replacements Who presents for follow-up of his elevated troponin and shortness of breath  Last seen by myself in clinic October 2024 Cardiac CTA performed May 09, 2023  calcium score 81 Minimal coronary disease Results reviewed in detail  In retrospect, feels he had walking pneumonia which likely caused shortness of breath, weakness, fatigue Seen in the emergency room February 12, 2019 for for fever, vomiting, dry cough, COVID-negative Troponin in the ER 19, 24 Sodium 131 CT scan chest with groundglass airspace opacity left lower lobe Treated with Z-Pak, Tessalon Perles  Reports feeling much better, strength slowly improving Plays golf twice a week, has not restarted exercise program Previously with sweats, these have  been improving  Prior testing reviewed Stress test at [facility] May 2023  Prior lab work reviewed Total cholesterol 156 LDL 78  No EKG on today's visit  PMH:   has a past medical history of Actinic keratosis, Basal cell carcinoma (03/29/2013), Basal cell carcinoma (08/05/2016), Dysplastic nevus (07/31/2015), GERD (gastroesophageal reflux disease), Hypertension, OAG (open angle glaucoma), Prostate cancer (HCC), and Squamous cell carcinoma of skin (02/27/2009).  PSH:    Past Surgical History:  Procedure Laterality Date   ANTERIOR CRUCIATE LIGAMENT REPAIR  25 yrs ago   not sure which side   COLONOSCOPY WITH PROPOFOL N/A 10/04/2016   Procedure: COLONOSCOPY WITH PROPOFOL;  Surgeon: Scot Jun, MD;   Location: [facility] ENDOSCOPY;  Service: Endoscopy;  Laterality: N/A;   ESOPHAGOGASTRODUODENOSCOPY (EGD) WITH PROPOFOL N/A 10/04/2016   Procedure: ESOPHAGOGASTRODUODENOSCOPY (EGD) WITH PROPOFOL;  Surgeon: Scot Jun, MD;  Location: [facility] ENDOSCOPY;  Service: Endoscopy;  Laterality: N/A;   EYE SURGERY  04/2015   lens surgery from a dislodged les from cataract surgery   LYMPHADENECTOMY  06/19/2012   Procedure: LYMPHADENECTOMY;  Surgeon: Crecencio Mc, MD;  Location: [facility] ORS;  Service: Urology;  Laterality: Bilateral;   ROBOT ASSISTED LAPAROSCOPIC RADICAL PROSTATECTOMY  06/19/2012   Procedure: ROBOTIC ASSISTED LAPAROSCOPIC RADICAL PROSTATECTOMY LEVEL 2;  Surgeon: Crecencio Mc, MD;  Location: [facility] ORS;  Service: Urology;  Laterality: N/A;   TOTAL HIP ARTHROPLASTY Right 2015    Current Outpatient Medications  Medication Sig Dispense Refill   latanoprost (XALATAN) 0.005 % ophthalmic solution Place 1 drop into the left eye at bedtime.     meloxicam (MOBIC) 7.5 MG tablet TAKE 1 TABLET(7.5 MG) BY MOUTH TWICE DAILY AS NEEDED FOR PAIN 60 tablet 5   metoprolol tartrate (LOPRESSOR) 100 MG tablet Take 1 tablet (100 mg total) by mouth once for 1 dose. 1 tablet 0   MULTIPLE VITAMIN PO Take 1 tablet by mouth daily.     rosuvastatin (CRESTOR) 10 MG tablet Take 10 mg by mouth daily.     sildenafil (VIAGRA) 100 MG tablet Take 1 tablet (100 mg total) by mouth as needed. 10 tablet 1   timolol (TIMOPTIC) 0.5 % ophthalmic solution Place 1 drop into the right eye every morning.     triamterene-hydrochlorothiazide (DYAZIDE) 37.5-25 MG capsule Take 1 each (1 capsule total)  by mouth every morning. 90 capsule 3   Turmeric (QC TUMERIC COMPLEX PO) Take by mouth.     valACYclovir (VALTREX) 1000 MG tablet Take 1,000 mg by mouth as needed. Only for outbreaks of fever blisters     benzonatate (TESSALON PERLES) 100 MG capsule Take 1 capsule (100 mg total) by mouth 3 (three) times daily as needed for cough. (Patient not taking: Reported  on 05/03/2023) 30 capsule 1   chlorpheniramine-HYDROcodone (TUSSIONEX) 10-8 MG/5ML Take 5 mLs by mouth every 12 (twelve) hours as needed for cough. (Patient not taking: Reported on 05/03/2023) 70 mL 0   fluconazole (DIFLUCAN) 200 MG tablet Take 1 tablet (200 mg total) by mouth daily. (Patient not taking: Reported on 05/03/2023) 14 tablet 0   fluticasone (FLONASE) 50 MCG/ACT nasal spray Place 2 sprays into both nostrils daily. (Patient not taking: Reported on 05/03/2023) 16 g 6   ketorolac (ACULAR) 0.4 % SOLN INSTILL 1 DROP IN RIGHT EYE FOUR TIMES DAILY (Patient not taking: Reported on 05/03/2023)     meclizine (ANTIVERT) 25 MG tablet Take 1 tablet (25 mg total) by mouth 3 (three) times daily as needed for dizziness. (Patient not taking: Reported on 06/13/2023) 30 tablet 0   mupirocin ointment (BACTROBAN) 2 % Apply 1 Application topically 2 (two) times daily. Apply topically to wound until healed. (Patient not taking: Reported on 05/03/2023) 22 g 0   No current facility-administered medications for this visit.     Allergies:   Patient has no known allergies.   Social History:  The patient  reports that he has quit smoking. His smoking use included cigars. He has never used smokeless tobacco. He reports current alcohol use. He reports that he does not use drugs.   Family History:   family history includes Cancer in his brother, father, and mother; Diabetes in his mother; Heart disease in his mother; Stroke in his mother.    Review of Systems: Review of Systems  Constitutional: Negative.   HENT: Negative.    Respiratory: Negative.    Cardiovascular: Negative.   Gastrointestinal: Negative.   Musculoskeletal: Negative.   Neurological: Negative.   Psychiatric/Behavioral: Negative.    All other systems reviewed and are negative.   PHYSICAL EXAM: VS:  BP 110/70 (BP Location: Left Arm, Patient Position: Sitting, Cuff Size: Normal)   Pulse 80   Ht 5' 10.5" (1.791 m)   Wt 214 lb 8 oz (97.3 kg)    SpO2 97%   BMI 30.34 kg/m  , BMI Body mass index is 30.34 kg/m. Constitutional:  oriented to person, place, and time. No distress.  HENT:  Head: Grossly normal Eyes:  no discharge. No scleral icterus.  Neck: No JVD, no carotid bruits  Cardiovascular: Regular rate and rhythm, no murmurs appreciated Pulmonary/Chest: Clear to auscultation bilaterally, no wheezes or rails Abdominal: Soft.  no distension.  no tenderness.  Musculoskeletal: Normal range of motion Neurological:  normal muscle tone. Coordination normal. No atrophy Skin: Skin warm and dry Psychiatric: normal affect, pleasant   Recent Labs: 02/12/2023: ALT 21; Hemoglobin 12.5; Platelets 185 05/03/2023: BUN 20; Creatinine, Ser 1.01; Potassium 4.4; Sodium 140    Lipid Panel Lab Results  Component Value Date   CHOL 152 02/03/2022   HDL 48 02/03/2022   LDLCALC 76 02/03/2022   TRIG 164 (H) 02/03/2022      Wt Readings from Last 3 Encounters:  06/13/23 214 lb 8 oz (97.3 kg)  05/19/23 210 lb (95.3 kg)  05/03/23 211 lb 6.4 oz (95.9  kg)     ASSESSMENT AND PLAN:  Problem List Items Addressed This Visit       Cardiology Problems   BP (high blood pressure)   Hyperlipidemia     Other   Elevated troponin   Other Visit Diagnoses     Angina pectoris (HCC)    -  Primary   Dyspnea, unspecified type           shortness of breath, sweating CTA with no significant coronary disease, low calcium score Normal echocardiogram, normal EF No further workup at this time Recommend he restart exercise program In retrospect symptoms likely from walking pneumonia  Aortic atherosclerosis Mild disease noted on prior CT imaging On statin Cholesterol at goal  Essential hypertension Blood pressure is well controlled on today's visit. No changes made to the medications.  Cardiac CTA, echo, medications reviewed in detail  Signed, Dossie Arbour, M.D., Ph.D. [facility] [place], [place] [phone]

## 2023-06-13 ENCOUNTER — Encounter: Payer: Self-pay | Admitting: Cardiovascular Disease

## 2023-06-13 ENCOUNTER — Ambulatory Visit: Payer: Medicare Other | Admitting: Cardiovascular Disease

## 2023-06-13 ENCOUNTER — Ambulatory Visit: Payer: Medicare Other | Attending: Cardiovascular Disease | Admitting: Cardiovascular Disease

## 2023-06-13 VITALS — BP 110/70 | HR 80 | Ht 70.5 in | Wt 214.5 lb

## 2023-06-13 DIAGNOSIS — E782 Mixed hyperlipidemia: Secondary | ICD-10-CM | POA: Diagnosis not present

## 2023-06-13 DIAGNOSIS — H1820 Unspecified corneal edema: Secondary | ICD-10-CM | POA: Diagnosis not present

## 2023-06-13 DIAGNOSIS — I209 Angina pectoris, unspecified: Secondary | ICD-10-CM

## 2023-06-13 DIAGNOSIS — H0011 Chalazion right upper eyelid: Secondary | ICD-10-CM | POA: Diagnosis not present

## 2023-06-13 DIAGNOSIS — R0600 Dyspnea, unspecified: Secondary | ICD-10-CM

## 2023-06-13 DIAGNOSIS — R7989 Other specified abnormal findings of blood chemistry: Secondary | ICD-10-CM

## 2023-06-13 DIAGNOSIS — H4010X3 Unspecified open-angle glaucoma, severe stage: Secondary | ICD-10-CM | POA: Diagnosis not present

## 2023-06-13 DIAGNOSIS — H35351 Cystoid macular degeneration, right eye: Secondary | ICD-10-CM | POA: Diagnosis not present

## 2023-06-13 DIAGNOSIS — I10 Essential (primary) hypertension: Secondary | ICD-10-CM | POA: Diagnosis not present

## 2023-06-13 DIAGNOSIS — H401321 Pigmentary glaucoma, left eye, mild stage: Secondary | ICD-10-CM | POA: Diagnosis not present

## 2023-06-13 MED ORDER — ROSUVASTATIN CALCIUM 10 MG PO TABS: 10.0000 mg | ORAL_TABLET | Freq: Every day | ORAL | 3 refills | Status: DC

## 2023-06-13 NOTE — Patient Instructions (Signed)

## 2023-06-15 DIAGNOSIS — X58XXXA Exposure to other specified factors, initial encounter: Secondary | ICD-10-CM | POA: Diagnosis not present

## 2023-06-15 DIAGNOSIS — H4010X3 Unspecified open-angle glaucoma, severe stage: Secondary | ICD-10-CM | POA: Diagnosis not present

## 2023-06-15 DIAGNOSIS — S0501XA Injury of conjunctiva and corneal abrasion without foreign body, right eye, initial encounter: Secondary | ICD-10-CM | POA: Diagnosis not present

## 2023-06-15 DIAGNOSIS — H1820 Unspecified corneal edema: Secondary | ICD-10-CM | POA: Diagnosis not present

## 2023-08-29 ENCOUNTER — Other Ambulatory Visit: Payer: Self-pay

## 2023-08-29 DIAGNOSIS — B009 Herpesviral infection, unspecified: Secondary | ICD-10-CM

## 2023-08-29 MED ORDER — VALACYCLOVIR HCL 1 G PO TABS: ORAL_TABLET | ORAL | 11 refills | Status: AC

## 2023-08-29 NOTE — Progress Notes (Signed)
Patient left a voicemail requesting refills of Valtrex that Dr. Gwen Pounds prescribed for him a few years ago.

## 2023-10-10 ENCOUNTER — Ambulatory Visit (INDEPENDENT_AMBULATORY_CARE_PROVIDER_SITE_OTHER): Admitting: Family Medicine

## 2023-10-10 ENCOUNTER — Encounter: Payer: Self-pay | Admitting: Family Medicine

## 2023-10-10 VITALS — BP 128/98 | HR 73 | Temp 98.1°F | Ht 70.0 in | Wt 214.2 lb

## 2023-10-10 DIAGNOSIS — R0989 Other specified symptoms and signs involving the circulatory and respiratory systems: Secondary | ICD-10-CM | POA: Diagnosis not present

## 2023-10-10 DIAGNOSIS — R051 Acute cough: Secondary | ICD-10-CM

## 2023-10-10 DIAGNOSIS — J069 Acute upper respiratory infection, unspecified: Secondary | ICD-10-CM

## 2023-10-10 LAB — POC COVID19/FLU A&B COMBO
Covid Antigen, POC: NEGATIVE
Influenza A Antigen, POC: NEGATIVE
Influenza B Antigen, POC: NEGATIVE

## 2023-10-10 MED ORDER — BENZONATATE 100 MG PO CAPS: 100.0000 mg | ORAL_CAPSULE | Freq: Two times a day (BID) | ORAL | 0 refills | Status: AC | PRN

## 2023-10-10 MED ORDER — PROMETHAZINE-DM 6.25-15 MG/5ML PO SYRP: 2.5000 mL | ORAL_SOLUTION | Freq: Four times a day (QID) | ORAL | 0 refills | Status: AC | PRN

## 2023-10-10 NOTE — Progress Notes (Signed)
 Acute Office Visit  Introduced to nurse practitioner role and practice setting.  All questions answered.  Discussed provider/patient relationship and expectations.   Subjective:     Patient ID: Dennis Steele, male    DOB: [birth year], 73 y.o.   MRN: [id number]  Chief Complaint  Patient presents with   URI    Fever and sinus concerns Had a little fever this morning when waking up Started Friday afternoon with sneezing and a dry-hacking cough No coughing up flem Has been taking benedryl and other OTC medications to try and help   Dennis Steele is a 73 year old male who presents with flu-like symptoms.  He has been experiencing URI for the past few days, including congestion and a persistent dry cough. The symptoms began on Friday evening with intense sneezing episodes, which have since subsided. Thursday, 10/06/23, he played golf outdoors, day before symptoms started. He denies having a fever but felt fatigued, especially in the mornings. He has a history of seasonal sinus issues. Over-the-counter medications like Benadryl have provided some relief. He has taken a coricidin tablet this morning, which helped alleviate his symptoms. No wheezing or ear fullness and no history of asthma.   He is concerned about his symptoms as he plans to visit his grandchildren in [place] this weekend. He reports feeling slightly better after getting up and moving around, but still feels not 100%. He experienced a single episode of diarrhea this morning but denies nausea or vomiting.   He recently underwent a procedure to remove a nodule from his eyelid on Friday morning. He believes this may have contributed to his symptoms, as his eyes were watering post-procedure. The eye irritation has improved since then, with no signs of infection or complications.  URI  This is a new problem. The current episode started in the past 7 days. The problem has been gradually worsening. There has been no fever.  Associated symptoms include congestion, coughing, rhinorrhea and sneezing. Pertinent negatives include no abdominal pain, chest pain, dysuria, ear pain, headaches, joint pain, joint swelling, nausea, neck pain, plugged ear sensation, rash, sinus pain, sore throat, swollen glands, vomiting or wheezing.    Review of Systems  HENT:  Positive for congestion, rhinorrhea and sneezing. Negative for ear pain, sinus pain and sore throat.   Respiratory:  Positive for cough. Negative for wheezing.   Cardiovascular:  Negative for chest pain.  Gastrointestinal:  Negative for abdominal pain, nausea and vomiting.  Genitourinary:  Negative for dysuria.  Musculoskeletal:  Negative for joint pain and neck pain.  Skin:  Negative for rash.  Neurological:  Negative for headaches.  All other systems reviewed and are negative.     Objective:    BP (!) 128/98   Pulse 73   Temp 98.1 F (36.7 C) (Oral)   Ht 5\' 10"  (1.778 m)   Wt 214 lb 3.2 oz (97.2 kg)   SpO2 98%   BMI 30.73 kg/m    Physical Exam Vitals reviewed.  Constitutional:      Appearance: Normal appearance. He is overweight.  HENT:     Head:     Salivary Glands: Right salivary gland is not diffusely enlarged or tender. Left salivary gland is not diffusely enlarged or tender.     Right Ear: Tympanic membrane, ear canal and external ear normal.     Left Ear: Tympanic membrane, ear canal and external ear normal.     Ears:     Comments: Bilateral hearing aids  present    Nose: Congestion and rhinorrhea present.     Right Turbinates: Not enlarged, swollen or pale.     Left Turbinates: Not enlarged, swollen or pale.     Right Sinus: No maxillary sinus tenderness or frontal sinus tenderness.     Left Sinus: No maxillary sinus tenderness or frontal sinus tenderness.  Eyes:     General:        Right eye: No discharge.        Left eye: No discharge.     Extraocular Movements: Extraocular movements intact.     Conjunctiva/sclera: Conjunctivae  normal.     Pupils: Pupils are equal, round, and reactive to light.     Comments: Mild erythema to R upper lidd/t eye procedure on 10/07/23  Neck:     Trachea: Trachea normal.  Cardiovascular:     Rate and Rhythm: Normal rate and regular rhythm.     Pulses: Normal pulses.     Heart sounds: Normal heart sounds. No murmur heard.    No friction rub. No gallop.  Pulmonary:     Effort: Pulmonary effort is normal. No respiratory distress.     Breath sounds: Normal breath sounds. No stridor. No wheezing, rhonchi or rales.  Chest:     Chest wall: No tenderness.  Abdominal:     Tenderness: There is no right CVA tenderness or left CVA tenderness.  Musculoskeletal:     Right lower leg: No edema.     Left lower leg: No edema.  Lymphadenopathy:     Cervical: No cervical adenopathy.  Skin:    General: Skin is warm and dry.  Neurological:     General: No focal deficit present.     Mental Status: He is alert and oriented to person, place, and time. Mental status is at baseline.  Psychiatric:        Mood and Affect: Mood normal.        Behavior: Behavior normal.        Thought Content: Thought content normal.        Judgment: Judgment normal.     Results for orders placed or performed in visit on 10/10/23  POC Covid19/Flu A&B Antigen  Result Value Ref Range   Influenza A Antigen, POC Negative Negative   Influenza B Antigen, POC Negative Negative   Covid Antigen, POC Negative Negative        Assessment & Plan:   Problem List Items Addressed This Visit       Other   Cough   Relevant Medications   benzonatate (TESSALON) 100 MG capsule   promethazine-dextromethorphan (PROMETHAZINE-DM) 6.25-15 MG/5ML syrup   Other Visit Diagnoses       Symptoms of upper respiratory infection (URI)    -  Primary   Relevant Orders   POC Covid19/Flu A&B Antigen (Completed)     Viral upper respiratory tract infection         Symptoms consistent with viral upper respiratory infection and  allergies.  No fever DOB, SOB, NV, palpitations, chills, chest pain.  Negative for flu and COVID Likely combination of allergies and URI due to virus Discussed symptom management and travel concerns. Dry hacking cough - Prescribed promethazine DM and benzonatate for cough relief. - Recommended OTC antihistamines: Claritin, Zyrtec, or Allegra for seasonal allergy relief. - Advised increased fluid intake, including hot tea with honey for cough. - may use OTC nasal saline spray and flonase for congestion - Instructed follow-up if symptoms persist for another week  for potential antibiotic consideration if develops into sinus infection   Meds ordered this encounter  Medications   benzonatate (TESSALON) 100 MG capsule    Sig: Take 1 capsule (100 mg total) by mouth 2 (two) times daily as needed for cough.    Dispense:  20 capsule    Refill:  0   promethazine-dextromethorphan (PROMETHAZINE-DM) 6.25-15 MG/5ML syrup    Sig: Take 2.5 mLs by mouth 4 (four) times daily as needed for cough.    Dispense:  118 mL    Refill:  0   Return if symptoms worsen or fail to improve.  I, Sallee Provencal, FNP, have reviewed all documentation for this visit. The documentation on 10/10/23 for the exam, diagnosis, procedures, and orders are all accurate and complete.   Sallee Provencal, FNP

## 2023-11-09 ENCOUNTER — Ambulatory Visit: Payer: Medicare Other | Admitting: Dermatology

## 2023-11-09 ENCOUNTER — Encounter: Payer: Self-pay | Admitting: Dermatology

## 2023-11-09 DIAGNOSIS — L820 Inflamed seborrheic keratosis: Secondary | ICD-10-CM

## 2023-11-09 DIAGNOSIS — Z1283 Encounter for screening for malignant neoplasm of skin: Secondary | ICD-10-CM

## 2023-11-09 DIAGNOSIS — Z7189 Other specified counseling: Secondary | ICD-10-CM

## 2023-11-09 DIAGNOSIS — Z86018 Personal history of other benign neoplasm: Secondary | ICD-10-CM

## 2023-11-09 DIAGNOSIS — Z8619 Personal history of other infectious and parasitic diseases: Secondary | ICD-10-CM

## 2023-11-09 DIAGNOSIS — W098XXA Fall on or from other playground equipment, initial encounter: Secondary | ICD-10-CM | POA: Diagnosis not present

## 2023-11-09 DIAGNOSIS — Z85828 Personal history of other malignant neoplasm of skin: Secondary | ICD-10-CM

## 2023-11-09 DIAGNOSIS — L814 Other melanin hyperpigmentation: Secondary | ICD-10-CM | POA: Diagnosis not present

## 2023-11-09 DIAGNOSIS — L570 Actinic keratosis: Secondary | ICD-10-CM | POA: Diagnosis not present

## 2023-11-09 DIAGNOSIS — Z79899 Other long term (current) drug therapy: Secondary | ICD-10-CM

## 2023-11-09 DIAGNOSIS — L719 Rosacea, unspecified: Secondary | ICD-10-CM

## 2023-11-09 DIAGNOSIS — Z8589 Personal history of malignant neoplasm of other organs and systems: Secondary | ICD-10-CM

## 2023-11-09 DIAGNOSIS — L578 Other skin changes due to chronic exposure to nonionizing radiation: Secondary | ICD-10-CM | POA: Diagnosis not present

## 2023-11-09 DIAGNOSIS — D1801 Hemangioma of skin and subcutaneous tissue: Secondary | ICD-10-CM

## 2023-11-09 DIAGNOSIS — D229 Melanocytic nevi, unspecified: Secondary | ICD-10-CM

## 2023-11-09 DIAGNOSIS — L821 Other seborrheic keratosis: Secondary | ICD-10-CM

## 2023-11-09 MED ORDER — METRONIDAZOLE 1 % EX GEL: Freq: Every day | CUTANEOUS | 11 refills | Status: AC

## 2023-11-09 NOTE — Patient Instructions (Addendum)
 Instructions for [facility] Medications  One or more of your medications was sent to the [facility] mail order compounding pharmacy. You will receive an email from them and can purchase the medicine through that link. It will then be mailed to your home at the address you confirmed. If for any reason you do not receive an email from them, please check your spam folder. If you still do not find the email, please let us know. [facility] phone number is [phone].       Due to recent changes in healthcare laws, you may see results of your pathology and/or laboratory studies on MyChart before the doctors have had a chance to review them. We understand that in some cases there may be results that are confusing or concerning to you. Please understand that not all results are received at the same time and often the doctors may need to interpret multiple results in order to provide you with the best plan of care or course of treatment. Therefore, we ask that you please give [place] 2 business days to thoroughly review all your results before contacting the office for clarification. Should we see a critical lab result, you will be contacted sooner.   If You Need Anything After Your Visit  If you have any questions or concerns for your doctor, please call our main line at [phone] and press option 4 to reach your doctor's medical assistant. If no one answers, please leave a voicemail as directed and we will return your call as soon as possible. Messages left after 4 pm will be answered the following business day.   You may also send [place] a message via MyChart. We typically respond to MyChart messages within 1-2 business days.  For prescription refills, please ask your pharmacy to contact our office. Our fax number is [phone].  If you have an urgent issue when the clinic is closed that cannot wait until the next business day, you can page your doctor at the number below.    Please note  that while we do our best to be available for urgent issues outside of office hours, we are not available 24/7.   If you have an urgent issue and are unable to reach [place], you may choose to seek medical care at your doctor's office, retail clinic, urgent care center, or emergency room.  If you have a medical emergency, please immediately call 911 or go to the emergency department.  Pager Numbers  - Dr. Gwen Pounds: [phone]  - Dr. Roseanne Reno: [phone]  - Dr. Katrinka Blazing: [phone]   In the event of inclement weather, please call our main line at [phone] for an update on the status of any delays or closures.  Dermatology Medication Tips: Please keep the boxes that topical medications come in in order to help keep track of the instructions about where and how to use these. Pharmacies typically print the medication instructions only on the boxes and not directly on the medication tubes.   If your medication is too expensive, please contact our office at [phone] option 4 or send [place] a message through MyChart.   We are unable to tell what your co-pay for medications will be in advance as this is different depending on your insurance coverage. However, we may be able to find a substitute medication at lower cost or fill out paperwork to get insurance to cover a needed medication.   If a prior authorization is required to get your medication covered by your  insurance company, please allow [place] 1-2 business days to complete this process.  Drug prices often vary depending on where the prescription is filled and some pharmacies may offer cheaper prices.  The website www.goodrx.com contains coupons for medications through different pharmacies. The prices here do not account for what the cost may be with help from insurance (it may be cheaper with your insurance), but the website can give you the price if you did not use any insurance.  - You can print the associated coupon and take it with your  prescription to the pharmacy.  - You may also stop by our office during regular business hours and pick up a GoodRx coupon card.  - If you need your prescription sent electronically to a different pharmacy, notify our office through [facility] or by phone at [phone] option 4.     Si Usted Necesita Algo Despus de Su Visita  Tambin puede enviarnos un mensaje a travs de Clinical cytogeneticist. Por lo general respondemos a los mensajes de MyChart en el transcurso de 1 a 2 das hbiles.  Para renovar recetas, por favor pida a su farmacia que se ponga en contacto con nuestra oficina. Annie Sable de fax es [place] [phone].  Si tiene un asunto urgente cuando la clnica est cerrada y que no puede esperar hasta el siguiente da hbil, puede llamar/localizar a su doctor(a) al nmero que aparece a continuacin.   Por favor, tenga en cuenta que aunque hacemos todo lo posible para estar disponibles para asuntos urgentes fuera del horario de [place], no estamos disponibles las 24 horas del da, los 7 [address]  [address] de la [place].   Si tiene un problema urgente y no puede comunicarse con nosotros, puede optar por buscar atencin mdica  en el consultorio de su doctor(a), en una clnica privada, en un centro de atencin urgente o en una sala de emergencias.  Si tiene Engineer, drilling, por favor llame inmediatamente al 911 o vaya a la sala de emergencias.  Nmeros de bper  - Dr. Gwen Pounds: [phone]  - Dra. Roseanne Reno: [phone]  - Dr. Katrinka Blazing: [phone]   En caso de inclemencias del tiempo, por favor llame a Lacy Duverney principal al [phone] para una actualizacin sobre el [place] de cualquier retraso o cierre.  Consejos para la medicacin en dermatologa: Por favor, guarde las cajas en las que vienen los medicamentos de uso tpico para ayudarle a seguir las instrucciones sobre dnde y cmo usarlos. Las farmacias generalmente imprimen las instrucciones del medicamento slo en las cajas y no  directamente en los tubos del [place].   Si su medicamento es muy caro, por favor, pngase en contacto con Rolm Gala llamando al [phone] y presione la opcin 4 o envenos un mensaje a travs de Clinical cytogeneticist.   No podemos decirle cul ser su copago por los medicamentos por adelantado ya que esto es diferente dependiendo de la cobertura de su seguro. Sin embargo, es posible que podamos encontrar un medicamento sustituto a Audiological scientist un formulario para que el seguro cubra el medicamento que se considera necesario.   Si se requiere una autorizacin previa para que su compaa de seguros [place] su medicamento, por favor permtanos de 1 a 2 das hbiles para completar [address]  Los precios de los medicamentos varan con frecuencia dependiendo del Environmental consultant de dnde se surte la receta y alguna farmacias pueden ofrecer precios ms baratos.  El sitio web www.goodrx.com tiene cupones para medicamentos de Health and safety inspector. Los precios aqu no tienen  en cuenta lo que podra costar con la ayuda del seguro (puede ser ms barato con su seguro), pero el sitio web puede darle el precio si no Visual merchandiser.  - Puede imprimir el cupn correspondiente y llevarlo con su receta a la farmacia.  - Tambin puede pasar por nuestra oficina durante el horario de atencin regular y Education officer, museum una tarjeta de cupones de GoodRx.  - Si necesita que su receta se enve electrnicamente a una farmacia diferente, informe a nuestra oficina a travs de MyChart de [place] o por telfono llamando al [phone] y presione la opcin 4.

## 2023-11-09 NOTE — Progress Notes (Signed)
 Follow-Up Visit   Subjective  Dennis Steele is a 73 y.o. male who presents for the following: Skin Cancer Screening and Full Body Skin Exam The patient presents for Total-Body Skin Exam (TBSE) for skin cancer screening and mole check. The patient has spots, moles and lesions to be evaluated, some may be new or changing and the patient may have concern these could be cancer.  The following portions of the chart were reviewed this encounter and updated as appropriate: medications, allergies, medical history  Review of Systems:  No other skin or systemic complaints except as noted in HPI or Assessment and Plan.  Objective  Well appearing patient in no apparent distress; mood and affect are within normal limits.  A full examination was performed including scalp, head, eyes, ears, nose, lips, neck, chest, axillae, abdomen, back, buttocks, bilateral upper extremities, bilateral lower extremities, hands, feet, fingers, toes, fingernails, and toenails. All findings within normal limits unless otherwise noted below.   Relevant physical exam findings are noted in the Assessment and Plan.  Scalp x 1, R post thigh x 4, back x 6, L clavicle x 2, arms and hands x 15, lower legs x 11 (39) Erythematous stuck-on, waxy papule or plaque Face x 16 (16) Erythematous thin papules/macules with gritty scale.   Assessment & Plan   SKIN CANCER SCREENING PERFORMED TODAY.  ACTINIC DAMAGE WITH PRECANCEROUS ACTINIC KERATOSES Counseling for Topical Chemotherapy Management: Patient exhibits: - Severe, confluent actinic changes with pre-cancerous actinic keratoses that is secondary to cumulative UV radiation exposure over time - Condition that is severe; chronic, not at goal. - diffuse scaly erythematous macules and papules with underlying dyspigmentation - Discussed Prescription "Field Treatment" topical Chemotherapy for Severe, Chronic Confluent Actinic Changes with Pre-Cancerous Actinic Keratoses Field  treatment involves treatment of an entire area of skin that has confluent Actinic Changes (Sun/ Ultraviolet light damage) and PreCancerous Actinic Keratoses by method of PhotoDynamic Therapy (PDT) and/or prescription Topical Chemotherapy agents such as 5-fluorouracil, 5-fluorouracil/calcipotriene, and/or imiquimod.  The purpose is to decrease the number of clinically evident and subclinical PreCancerous lesions to prevent progression to development of skin cancer by chemically destroying early precancer changes that may or may not be visible.  It has been shown to reduce the risk of developing skin cancer in the treated area. As a result of treatment, redness, scaling, crusting, and open sores may occur during treatment course. One or more than one of these methods may be used and may have to be used several times to control, suppress and eliminate the PreCancerous changes. Discussed treatment course, expected reaction, and possible side effects. - Recommend daily broad spectrum sunscreen SPF 30+ to sun-exposed areas, reapply every 2 hours as needed.  - Staying in the shade or wearing long sleeves, sun glasses (UVA+UVB protection) and wide brim hats (4-inch brim around the entire circumference of the hat) are also recommended. - Call for new or changing lesions.   LENTIGINES, SEBORRHEIC KERATOSES, HEMANGIOMAS - Benign normal skin lesions - Benign-appearing - Call for any changes  MELANOCYTIC NEVI - Tan-brown and/or pink-flesh-colored symmetric macules and papules - Benign appearing on exam today - Observation - Call clinic for new or changing moles - Recommend daily use of broad spectrum spf 30+ sunscreen to sun-exposed areas.   HISTORY OF BASAL CELL CARCINOMA OF THE SKIN - No evidence of recurrence today - Recommend regular full body skin exams - Recommend daily broad spectrum sunscreen SPF 30+ to sun-exposed areas, reapply every 2 hours as needed.  -  Call if any new or changing lesions are  noted between office visits  HISTORY OF SQUAMOUS CELL CARCINOMA OF THE SKIN - No evidence of recurrence today - No lymphadenopathy - Recommend regular full body skin exams - Recommend daily broad spectrum sunscreen SPF 30+ to sun-exposed areas, reapply every 2 hours as needed.  - Call if any new or changing lesions are noted between office visits  HISTORY OF DYSPLASTIC NEVUS No evidence of recurrence today Recommend regular full body skin exams Recommend daily broad spectrum sunscreen SPF 30+ to sun-exposed areas, reapply every 2 hours as needed.  Call if any new or changing lesions are noted between office visits INFLAMED SEBORRHEIC KERATOSIS (39) Scalp x 1, R post thigh x 4, back x 6, L clavicle x 2, arms and hands x 15, lower legs x 11 (39) Symptomatic, irritating, patient would like treated.  Destruction of lesion - Scalp x 1, R post thigh x 4, back x 6, L clavicle x 2, arms and hands x 15, lower legs x 11 (39) Complexity: simple   Destruction method: cryotherapy   Informed consent: discussed and consent obtained   Timeout:  patient name, date of birth, surgical site, and procedure verified Lesion destroyed using liquid nitrogen: Yes   Region frozen until ice ball extended beyond lesion: Yes   Outcome: patient tolerated procedure well with no complications   Post-procedure details: wound care instructions given   AK (ACTINIC KERATOSIS) (16) Face x 16 (16) Actinic keratoses are precancerous spots that appear secondary to cumulative UV radiation exposure/sun exposure over time. They are chronic with expected duration over 1 year. A portion of actinic keratoses will progress to squamous cell carcinoma of the skin. It is not possible to reliably predict which spots will progress to skin cancer and so treatment is recommended to prevent development of skin cancer.  Recommend daily broad spectrum sunscreen SPF 30+ to sun-exposed areas, reapply every 2 hours as needed.  Recommend staying  in the shade or wearing long sleeves, sun glasses (UVA+UVB protection) and wide brim hats (4-inch brim around the entire circumference of the hat). Call for new or changing lesions.  Destruction of lesion - Face x 16 (16) Complexity: simple   Destruction method: cryotherapy   Informed consent: discussed and consent obtained   Timeout:  patient name, date of birth, surgical site, and procedure verified Lesion destroyed using liquid nitrogen: Yes   Region frozen until ice ball extended beyond lesion: Yes   Outcome: patient tolerated procedure well with no complications   Post-procedure details: wound care instructions given    ROSACEA Exam Mid face erythema with telangiectasias +/- scattered inflammatory papules of the face Chronic and persistent condition with duration or expected duration over one year. Condition is symptomatic/ bothersome to patient. Not currently at goal. Rosacea is a chronic progressive skin condition usually affecting the face of adults, causing redness and/or acne bumps. It is treatable but not curable. It sometimes affects the eyes (ocular rosacea) as well. It may respond to topical and/or systemic medication and can flare with stress, sun exposure, alcohol, exercise, topical steroids (including hydrocortisone/cortisone 10) and some foods.  Daily application of broad spectrum spf 30+ sunscreen to face is recommended to reduce flares. Treatment Plan Start Skin Medicinals SM4 Azelaic Acid 15% / Metronidazole 1% / Ivermectin 1% Cream mix QD.  HERPESVIRAL INFECTION (COLD SORES) Exam Clear today Chronic condition with duration or expected duration over one year. Currently well-controlled. Herpes Simplex Virus = Cold Sores = Fever  Blisters is a chronic recurring blistering; scabbing sore-producing viral infection that is recurrent usually in the same area triggered by stress, sun/UV exposure and trauma.  It is infectious and can be spread from person to person by direct  contact.  It is not curable, but is treatable with topical and oral medication. Treatment Plan Take Valacyclovir 2 grams every 12 hours for 2 doses with a glass of water at the first sign of symptoms.  Return in about 1 year (around 11/08/2024) for TBSE.  Arlinda Lais, CMA, am acting as scribe for Celine Collard, MD .  Documentation: I have reviewed the above documentation for accuracy and completeness, and I agree with the above.  Celine Collard, MD

## 2023-12-28 ENCOUNTER — Other Ambulatory Visit: Payer: Self-pay | Admitting: Physician Assistant

## 2023-12-28 DIAGNOSIS — I10 Essential (primary) hypertension: Secondary | ICD-10-CM

## 2024-01-20 ENCOUNTER — Ambulatory Visit: Payer: Self-pay

## 2024-01-20 NOTE — Telephone Encounter (Signed)
 FYI Only or Action Required?: Action required by provider: request for appointment and PT wants appt for Monday afternoon - none available.  Patient was last seen in primary care on 10/10/2023 by Wellington Curtis LABOR, FNP. Called Nurse Triage reporting Rash on scrotum - warm, and sensitive to touch Symptoms began several weeks ago. Also rash on foot.Interventions attempted: OTC medications: Triamcinolone  cream. Symptoms are: unchanged.  Triage Disposition: See HCP Within 4 Hours (Or PCP Triage)  Patient/caregiver understands and will follow disposition?: No, refuses disposition - wants to be seen on Monday afternoon.                  patient calling rash on testicles, warm to touch, looks like cat scratches, going on for 2 weeks, rash on foot going on for 3 weeks   Patient phone [phone]  Reason for Disposition  Scrotum looks infected (e.g., draining sore, ulcer, red rash)  Answer Assessment - Initial Assessment Questions 1. LOCATION and RADIATION: Where is the pain located?      Scrotum 2. QUALITY: What does the pain feel like?  (e.g., sharp, dull, aching, burning)     Stinging burning 3. SEVERITY: How bad is the pain?  (Scale 1-10; or mild, moderate, severe)   - MILD (1-3): doesn't interfere with normal activities    - MODERATE (4-7): interferes with normal activities (e.g., work or school) or awakens from sleep   - SEVERE (8-10): excruciating pain, unable to do any normal activities, difficulty walking     moderate 4. ONSET: When did the pain start?     2 weeks 5. PATTERN: Does it come and go, or has it been constant since it started?     constant 6. SCROTAL APPEARANCE: What does the scrotum look like? Is there any swelling or redness?      Rash - cat scratch 7. HERNIA: Has a doctor ever told you that you have a hernia?     no 8. OTHER SYMPTOMS: Do you have any other symptoms? (e.g., abdomen pain, difficulty passing urine, fever, vomiting)      Foot rash  Protocols used: Scrotum Pain-A-AH

## 2024-01-23 ENCOUNTER — Encounter: Payer: Self-pay | Admitting: Dermatology

## 2024-01-23 ENCOUNTER — Ambulatory Visit: Admitting: Dermatology

## 2024-01-23 ENCOUNTER — Ambulatory Visit (INDEPENDENT_AMBULATORY_CARE_PROVIDER_SITE_OTHER): Admitting: Dermatology

## 2024-01-23 DIAGNOSIS — Z79899 Other long term (current) drug therapy: Secondary | ICD-10-CM

## 2024-01-23 DIAGNOSIS — B353 Tinea pedis: Secondary | ICD-10-CM

## 2024-01-23 DIAGNOSIS — L304 Erythema intertrigo: Secondary | ICD-10-CM

## 2024-01-23 DIAGNOSIS — B356 Tinea cruris: Secondary | ICD-10-CM | POA: Diagnosis not present

## 2024-01-23 DIAGNOSIS — Z7189 Other specified counseling: Secondary | ICD-10-CM

## 2024-01-23 DIAGNOSIS — L0100 Impetigo, unspecified: Secondary | ICD-10-CM

## 2024-01-23 DIAGNOSIS — R234 Changes in skin texture: Secondary | ICD-10-CM | POA: Diagnosis not present

## 2024-01-23 MED ORDER — MUPIROCIN 2 % EX OINT: TOPICAL_OINTMENT | CUTANEOUS | 1 refills | Status: AC

## 2024-01-23 MED ORDER — TERBINAFINE HCL 250 MG PO TABS: 250.0000 mg | ORAL_TABLET | Freq: Every day | ORAL | 0 refills | Status: AC

## 2024-01-23 MED ORDER — KETOCONAZOLE 2 % EX CREA: TOPICAL_CREAM | CUTANEOUS | 1 refills | Status: AC

## 2024-01-23 NOTE — Patient Instructions (Addendum)
 Call in 30 days with update    Start Mupirocin  ointment once a day to open sores/scratched appearing areas at scrotum.  Start ketoconazole 2% cream at bedtime to groin and right foot.  Start Terbinafine 250 mg once daily for 30 days  Terbinafine Counseling  Terbinafine is an anti-fungal medicine that can be applied to the skin (over the counter) or taken by mouth (prescription) to treat fungal infections. The pill version is often used to treat fungal infections of the nails or scalp. While most people do not have any side effects from taking terbinafine pills, some possible side effects of the medicine can include taste changes, headache, loss of smell, vision changes, nausea, vomiting, or diarrhea.   Rare side effects can include irritation of the liver, allergic reaction, or decrease in blood counts (which may show up as not feeling well or developing an infection). If you are concerned about any of these side effects, please stop the medicine and call your doctor, or in the case of an emergency such as feeling very unwell, seek immediate medical care.     Use of an absorbant powder such as Zeasorb AF powder or other OTC antifungal powder to the area daily can prevent rash recurrence.    Due to recent changes in healthcare laws, you may see results of your pathology and/or laboratory studies on MyChart before the doctors have had a chance to review them. We understand that in some cases there may be results that are confusing or concerning to you. Please understand that not all results are received at the same time and often the doctors may need to interpret multiple results in order to provide you with the best plan of care or course of treatment. Therefore, we ask that you please give us  2 business days to thoroughly review all your results before contacting the office for clarification. Should we see a critical lab result, you will be contacted sooner.   If You Need Anything After Your  Visit  If you have any questions or concerns for your doctor, please call our main line at [phone] and press option 4 to reach your doctor's medical assistant. If no one answers, please leave a voicemail as directed and we will return your call as soon as possible. Messages left after 4 pm will be answered the following business day.   You may also send us  a message via MyChart. We typically respond to MyChart messages within 1-2 business days.  For prescription refills, please ask your pharmacy to contact our office. Our fax number is [phone].  If you have an urgent issue when the clinic is closed that cannot wait until the next business day, you can page your doctor at the number below.    Please note that while we do our best to be available for urgent issues outside of office hours, we are not available 24/7.   If you have an urgent issue and are unable to reach us , you may choose to seek medical care at your doctor's office, retail clinic, urgent care center, or emergency room.  If you have a medical emergency, please immediately call 911 or go to the emergency department.  Pager Numbers  - Dr. Hester: [phone]  - Dr. Jackquline: [phone]  - Dr. Claudene: [phone]   In the event of inclement weather, please call our main line at [phone] for an update on the status of any delays or closures.  Dermatology Medication Tips: Please keep the boxes that topical  medications come in in order to help keep track of the instructions about where and how to use these. Pharmacies typically print the medication instructions only on the boxes and not directly on the medication tubes.   If your medication is too expensive, please contact our office at [phone] option 4 or send us  a message through MyChart.   We are unable to tell what your co-pay for medications will be in advance as this is different depending on your insurance coverage. However, we may be able to find a  substitute medication at lower cost or fill out paperwork to get insurance to cover a needed medication.   If a prior authorization is required to get your medication covered by your insurance company, please allow us  1-2 business days to complete this process.  Drug prices often vary depending on where the prescription is filled and some pharmacies may offer cheaper prices.  The website www.goodrx.com contains coupons for medications through different pharmacies. The prices here do not account for what the cost may be with help from insurance (it may be cheaper with your insurance), but the website can give you the price if you did not use any insurance.  - You can print the associated coupon and take it with your prescription to the pharmacy.  - You may also stop by our office during regular business hours and pick up a GoodRx coupon card.  - If you need your prescription sent electronically to a different pharmacy, notify our office through [facility] or by phone at [phone] option 4.     Si Usted Necesita Algo Despus de Su Visita  Tambin puede enviarnos un mensaje a travs de Clinical cytogeneticist. Por lo general respondemos a los mensajes de MyChart en el transcurso de 1 a 2 das hbiles.  Para renovar recetas, por favor pida a su farmacia que se ponga en contacto con nuestra oficina. Randi lakes de fax es [place] [phone].  Si tiene un asunto urgente cuando la clnica est cerrada y que no puede esperar hasta el siguiente da hbil, puede llamar/localizar a su doctor(a) al nmero que aparece a continuacin.   Por favor, tenga en cuenta que aunque hacemos todo lo posible para estar disponibles para asuntos urgentes fuera del horario de [place], no estamos disponibles las 24 horas del da, los 7 [address]  [address] de la [place].   Si tiene un problema urgente y no puede comunicarse con nosotros, puede optar por buscar atencin mdica  en el consultorio de su doctor(a), en una clnica privada, en un  centro de atencin urgente o en una sala de emergencias.  Si tiene Engineer, drilling, por favor llame inmediatamente al 911 o vaya a la sala de emergencias.  Nmeros de bper  - Dr. Hester: [phone]  - Dra. Jackquline: [phone]  - Dr. Claudene: [phone]   En caso de inclemencias del tiempo, por favor llame a landry capes principal al [phone] para una actualizacin sobre el [place] de cualquier retraso o cierre.  Consejos para la medicacin en dermatologa: Por favor, guarde las cajas en las que vienen los medicamentos de uso tpico para ayudarle a seguir las instrucciones sobre dnde y cmo usarlos. Las farmacias generalmente imprimen las instrucciones del medicamento slo en las cajas y no directamente en los tubos del [place].   Si su medicamento es muy caro, por favor, pngase en contacto con landry rieger llamando al [phone] y presione la opcin 4 o envenos un mensaje a travs de Clinical cytogeneticist.   No  podemos decirle cul ser su copago por los medicamentos por adelantado ya que esto es diferente dependiendo de la cobertura de su seguro. Sin embargo, es posible que podamos encontrar un medicamento sustituto a Audiological scientist un formulario para que el seguro cubra el medicamento que se considera necesario.   Si se requiere una autorizacin previa para que su compaa de seguros malta su medicamento, por favor permtanos de 1 a 2 das hbiles para completar este proceso.  Los precios de los medicamentos varan con frecuencia dependiendo del Environmental consultant de dnde se surte la receta y alguna farmacias pueden ofrecer precios ms baratos.  El sitio web www.goodrx.com tiene cupones para medicamentos de Health and safety inspector. Los precios aqu no tienen en cuenta lo que podra costar con la ayuda del seguro (puede ser ms barato con su seguro), pero el sitio web puede darle el precio si no utiliz Tourist information centre manager.  - Puede imprimir el cupn correspondiente y llevarlo con su  receta a la farmacia.  - Tambin puede pasar por nuestra oficina durante el horario de atencin regular y Education officer, museum una tarjeta de cupones de GoodRx.  - Si necesita que su receta se enve electrnicamente a una farmacia diferente, informe a nuestra oficina a travs de MyChart de [place] o por telfono llamando al [phone] y presione la opcin 4.

## 2024-01-23 NOTE — Progress Notes (Signed)
   Follow Up Visit   Subjective  Dennis Steele is a 73 y.o. male who presents for the following: Rash on scrotum. Dur: 2 weeks. Has been using Polysporin since Friday. C/O redness that looks like cat scratches.   Check left foot. C/O scaly rash for ~3 weeks.   The following portions of the chart were reviewed this encounter and updated as appropriate: medications, allergies, medical history  Review of Systems:  No other skin or systemic complaints except as noted in HPI or Assessment and Plan.  Objective  Well appearing patient in no apparent distress; mood and affect are within normal limits.  A focused examination was performed of the following areas: Groin, left foot  Relevant exam findings are noted in the Assessment and Plan.   Assessment & Plan    INTERTRIGO With Tinea cruris and Tinea Pedis And area of fissure and impetigo Left scrotum Exam: fissuring and edema at scrotum  Chronic and persistent condition with duration or expected duration over one year. Condition is bothersome/symptomatic for patient. Currently flared.  Reviewed labs from 02/12/2023. LFT WNL to start oral antifungal.  Intertrigo is a chronic recurrent rash that occurs in skin fold areas that may be associated with friction; heat; moisture; yeast; fungus; and bacteria.  It is exacerbated by increased movement / activity; sweating; and higher atmospheric temperature.  Use of an absorbant powder such as Zeasorb AF powder or other OTC antifungal powder to the area daily can prevent rash recurrence. Other options to help keep the area dry include blow drying the area after bathing or using antiperspirant products such as Duradry sweat minimizing gel.  Treatment Plan:  Start Mupirocin  ointment once a day to open sores/scratched appearing areas at scrotum.  Start ketoconazole 2% cream at bedtime to groin and foot rash all over  Start Terbinafine 250 mg once daily for 30 days  Terbinafine  Counseling Terbinafine is an anti-fungal medicine that can be applied to the skin (over the counter) or taken by mouth (prescription) to treat fungal infections. The pill version is often used to treat fungal infections of the nails or scalp. While most people do not have any side effects from taking terbinafine pills, some possible side effects of the medicine can include taste changes, headache, loss of smell, vision changes, nausea, vomiting, or diarrhea.   Rare side effects can include irritation of the liver, allergic reaction, or decrease in blood counts (which may show up as not feeling well or developing an infection). If you are concerned about any of these side effects, please stop the medicine and call your doctor, or in the case of an emergency such as feeling very unwell, seek immediate medical care.     Return for Follow Up As Scheduled, With Dr. Hester.  I, Jill Parcell, CMA, am acting as scribe for Alm Hester, MD.   Documentation: I have reviewed the above documentation for accuracy and completeness, and I agree with the above.  Alm Hester, MD

## 2024-02-06 ENCOUNTER — Other Ambulatory Visit: Payer: Self-pay | Admitting: Family Medicine

## 2024-04-04 ENCOUNTER — Ambulatory Visit: Admitting: Orthopedic Surgery

## 2024-07-28 ENCOUNTER — Other Ambulatory Visit: Payer: Self-pay | Admitting: Cardiovascular Disease

## 2024-07-30 NOTE — Telephone Encounter (Signed)
 Patient need make an appointment with his Cardiologist, Evalene JINNY Lunger, MD [phone]  Thank

## 2024-11-14 ENCOUNTER — Ambulatory Visit: Admitting: Dermatology
